# Patient Record
Sex: Female | Born: 1985
Health system: Southern US, Community
[De-identification: ages and names within clinical notes are randomized; demographics above are authoritative.]

## PROBLEM LIST (undated history)

## (undated) ENCOUNTER — Inpatient Hospital Stay (HOSPITAL_COMMUNITY): Payer: Self-pay

## (undated) DIAGNOSIS — D649 Anemia, unspecified: Secondary | ICD-10-CM

## (undated) DIAGNOSIS — F419 Anxiety disorder, unspecified: Secondary | ICD-10-CM

## (undated) DIAGNOSIS — M51369 Other intervertebral disc degeneration, lumbar region without mention of lumbar back pain or lower extremity pain: Secondary | ICD-10-CM

## (undated) DIAGNOSIS — R51 Headache: Secondary | ICD-10-CM

## (undated) DIAGNOSIS — Z9289 Personal history of other medical treatment: Secondary | ICD-10-CM

## (undated) DIAGNOSIS — R519 Headache, unspecified: Secondary | ICD-10-CM

## (undated) DIAGNOSIS — M5136 Other intervertebral disc degeneration, lumbar region: Secondary | ICD-10-CM

## (undated) DIAGNOSIS — M5126 Other intervertebral disc displacement, lumbar region: Secondary | ICD-10-CM

## (undated) DIAGNOSIS — R569 Unspecified convulsions: Secondary | ICD-10-CM

## (undated) HISTORY — DX: Unspecified convulsions: R56.9

## (undated) HISTORY — PX: TONSILLECTOMY: SUR1361

## (undated) HISTORY — PX: WISDOM TOOTH EXTRACTION: SHX21

---

## 2015-04-02 ENCOUNTER — Encounter: Payer: 59 | Attending: Obstetrics | Admitting: Skilled Nursing Facility1

## 2015-04-02 ENCOUNTER — Encounter: Payer: Self-pay | Admitting: Skilled Nursing Facility1

## 2015-04-02 VITALS — Ht 71.0 in | Wt 286.0 lb

## 2015-04-02 DIAGNOSIS — Z6839 Body mass index (BMI) 39.0-39.9, adult: Secondary | ICD-10-CM | POA: Insufficient documentation

## 2015-04-02 DIAGNOSIS — Z713 Dietary counseling and surveillance: Secondary | ICD-10-CM | POA: Insufficient documentation

## 2015-04-02 DIAGNOSIS — E669 Obesity, unspecified: Secondary | ICD-10-CM | POA: Diagnosis not present

## 2015-04-02 NOTE — Progress Notes (Signed)
  Medical Nutrition Therapy:  Appt start time: 8:15 end time:  9:15.   Assessment:  Primary concerns today: referred for obesity. Pt states her biggest problem is that she has a back injury 3 years ago and gained weight since that time. Pt states she is a vegetarian and avoids fatty foods. Pt states her usual weight was 201 pounds before her back injury. Pt states she has tried intermittent fasting-only eating 8 hours a day from 11am to 8pm for 7 days a week done for 1.5 months; she is also monitoring her added sugar intake attempting to keep it under 55 grams a day. Pt states she also worked out with a Physiological scientist and lost 15 pounds but then sprained her ankle and gained it back. Pt states she does not sleep well-she can fall asleep but cannot stay asleep and at most gets about 4 hours of sleep this has been happening for about 6 months at that time she took on a more stressful position. Pt states she eats at the table most of the time but sometimes she does eat in front of the television.   Preferred Learning Style:   Auditory  Visual Learning Readiness:   Ready  MEDICATIONS: See List   DIETARY INTAKE:  Usual eating pattern includes 2 meals and 3 snacks per day.  Everyday foods include starbucks coffee.  Avoided foods include vegeterian.    24-hr recall:  B ( AM): Starbucks every morning with milk Snk ( AM): almonds  L ( PM): salad with veggie chicken strips------which which or tai food Snk ( PM): almonds D ( PM): protein, carbohydrate, vegetables Snk ( PM): almonds Beverages: water  Usual physical activity: 4 times a week-weight lifting and three miles on the tredmill, garden  Estimated energy needs: 1800 calories 200 g carbohydrates 135 g protein 50 g fat  Progress Towards Goal(s):  In progress.   Nutritional Diagnosis:  Superior-3.3 Overweight/obesity As related to calorically dense beverage every day and large portions at meals.  As evidenced by pt report, 24 hour  recall, and BMI 39.89.    Intervention:  Nutrition counseling for obesity. Dietitian educated the pt on calrically dense beverages such as her starbucks coffee, "hidden" calories, balanced meals,and daily physical activity.  Teaching Method Utilized:  Visual Auditory  Barriers to learning/adherence to lifestyle change: the time it takes to make behavior change  Demonstrated degree of understanding via:  Teach Back   Monitoring/Evaluation:  Dietary intake, exercise, and body weight prn.

## 2015-04-02 NOTE — Patient Instructions (Signed)
-  Instead of using a scale use your measurements as a measure of change as well as energy level and how your clothes fit -Stress relief mechanisms: gardening, meditating/yoga, and hiking attempt these activities for 30 minutes every day -For better sleep: try a mask, ear plugs, or a noise machine - Try to eat three meals a day -Attempt a balanced meal for every meal and snack throughout the day -Read the nutrition facts label and compare your products

## 2015-05-29 ENCOUNTER — Other Ambulatory Visit (HOSPITAL_COMMUNITY): Payer: Self-pay | Admitting: Obstetrics

## 2015-05-29 DIAGNOSIS — N979 Female infertility, unspecified: Secondary | ICD-10-CM

## 2015-06-04 ENCOUNTER — Ambulatory Visit: Payer: Self-pay | Admitting: Skilled Nursing Facility1

## 2015-06-05 ENCOUNTER — Ambulatory Visit (HOSPITAL_COMMUNITY)
Admission: RE | Admit: 2015-06-05 | Discharge: 2015-06-05 | Disposition: A | Discharge status: Home / Self Care | Payer: 59 | Source: Ambulatory Visit | Attending: Obstetrics | Admitting: Obstetrics

## 2015-06-05 DIAGNOSIS — N979 Female infertility, unspecified: Secondary | ICD-10-CM | POA: Diagnosis present

## 2015-06-05 MED ORDER — IOHEXOL 300 MG/ML  SOLN
30.0000 mL | Freq: Once | INTRAMUSCULAR | Status: DC | PRN
  Administered 2015-06-05: 30 mL
  Filled 2015-06-05: qty 30

## 2015-08-21 LAB — OB RESULTS CONSOLE GC/CHLAMYDIA
Chlamydia: NEGATIVE
Gonorrhea: NEGATIVE

## 2015-09-19 LAB — OB RESULTS CONSOLE ANTIBODY SCREEN: Antibody Screen: NEGATIVE

## 2015-09-19 LAB — OB RESULTS CONSOLE RUBELLA ANTIBODY, IGM: RUBELLA: IMMUNE

## 2015-09-19 LAB — OB RESULTS CONSOLE HIV ANTIBODY (ROUTINE TESTING): HIV: NONREACTIVE

## 2015-09-19 LAB — OB RESULTS CONSOLE HEPATITIS B SURFACE ANTIGEN: Hepatitis B Surface Ag: NEGATIVE

## 2015-09-19 LAB — OB RESULTS CONSOLE ABO/RH: RH TYPE: POSITIVE

## 2015-09-19 LAB — OB RESULTS CONSOLE RPR: RPR: NONREACTIVE

## 2015-10-28 NOTE — L&D Delivery Note (Signed)
Delivery Note Patient pushed for 30 minutes after she was noted to be C/C/+2 and At 5:11 PM a viable and healthy female was delivered via Vaginal, Spontaneous Delivery (Presentation: ; Occiput Posterior), over a midline episiotomy.  APGAR: 9, 9; weight 8 lb 3.6 oz (3730 g).  Delayed cord clamping was performed. Cord blood was obtained for donation.  The placenta was then delivered spontaneously, intact, 3 vessels were noted.  There was persistent uterine atony and relaxation of the lower uterine segment despite massage and IV pitocin. A post partum hemorrhage was then announced and a code hemorrhage was called. Cytotec 1000 mcg was placed per rectum. A foley catheter was replaced and 2 large bore IVs were placed. Dr. Glo Herring assisted in massage and removal of intrauterine clots.  IM Hemabate was administered and the uterine tone improved and the bleeding slowed.  The patient remained awake, alert and oriented throughout the emergency and her vitals remained stable.   The Second degree episiotomy was then repaired in routine fashion using a 2-0 vicryl rapide and a 3-0 chromic suture.  The patient had a low Hb 9 on admission and the patient was counseled during and after the emergency. She was made Aware that she may require a blood transfusion depending on what the nadir of the Hb.  Will check a cbc 2 hrs post partum and one in am. Will transfer patient to AICU / Antepartum for close monitoring d/t her elevated BPs as well as the hemorrhage.    Anesthesia: Epidural  Episiotomy: Median Lacerations: 2nd degree Suture Repair: 2.0 vicryl rapide Est. Blood Loss (mL): [id number]  Mom to AICU.  Baby to Couplet care / Skin to Skin.  [name], Beth 04/02/2016, 8:11 PM

## 2016-03-03 LAB — OB RESULTS CONSOLE GBS: GBS: NEGATIVE

## 2016-04-01 ENCOUNTER — Inpatient Hospital Stay (HOSPITAL_COMMUNITY): Payer: BLUE CROSS/BLUE SHIELD

## 2016-04-01 ENCOUNTER — Encounter (HOSPITAL_COMMUNITY): Payer: Self-pay | Admitting: General Surgery

## 2016-04-01 ENCOUNTER — Inpatient Hospital Stay (HOSPITAL_COMMUNITY)
Admission: AD | Admit: 2016-04-01 | Discharge: 2016-04-01 | Disposition: A | Discharge status: Home / Self Care | Payer: BLUE CROSS/BLUE SHIELD | Source: Ambulatory Visit | Attending: Obstetrics and Gynecology | Admitting: Obstetrics and Gynecology

## 2016-04-01 DIAGNOSIS — O4290 Premature rupture of membranes, unspecified as to length of time between rupture and onset of labor, unspecified weeks of gestation: Secondary | ICD-10-CM

## 2016-04-01 DIAGNOSIS — Z3490 Encounter for supervision of normal pregnancy, unspecified, unspecified trimester: Secondary | ICD-10-CM

## 2016-04-01 HISTORY — DX: Other intervertebral disc degeneration, lumbar region without mention of lumbar back pain or lower extremity pain: M51.369

## 2016-04-01 HISTORY — DX: Other intervertebral disc displacement, lumbar region: M51.26

## 2016-04-01 HISTORY — DX: Other intervertebral disc degeneration, lumbar region: M51.36

## 2016-04-01 LAB — POCT FERN TEST: POCT Fern Test: NEGATIVE

## 2016-04-01 NOTE — Discharge Summary (Signed)
Pt D/C home in her husband and mother's care. VSS. Pt in no apparent distress. Reviewed with pt the signs and symptoms of active labor and also signs of SROM. Reviewed clinical criteria that indicates active labor, SROM, or need to be seen in MAU or by MD. Pt. Verbalized understanding.

## 2016-04-02 ENCOUNTER — Inpatient Hospital Stay (HOSPITAL_COMMUNITY)
Admission: AD | Admit: 2016-04-02 | Discharge: 2016-04-04 | DRG: 774 | Disposition: A | Discharge status: Home / Self Care | Payer: BLUE CROSS/BLUE SHIELD | Source: Ambulatory Visit | Attending: Obstetrics & Gynecology | Admitting: Obstetrics & Gynecology

## 2016-04-02 ENCOUNTER — Inpatient Hospital Stay (HOSPITAL_COMMUNITY): Payer: BLUE CROSS/BLUE SHIELD | Admitting: Anesthesiology

## 2016-04-02 ENCOUNTER — Encounter (HOSPITAL_COMMUNITY): Payer: Self-pay | Admitting: *Deleted

## 2016-04-02 DIAGNOSIS — Z3A40 40 weeks gestation of pregnancy: Secondary | ICD-10-CM

## 2016-04-02 DIAGNOSIS — Z6841 Body Mass Index (BMI) 40.0 and over, adult: Secondary | ICD-10-CM | POA: Diagnosis not present

## 2016-04-02 DIAGNOSIS — O99284 Endocrine, nutritional and metabolic diseases complicating childbirth: Secondary | ICD-10-CM | POA: Diagnosis present

## 2016-04-02 DIAGNOSIS — E039 Hypothyroidism, unspecified: Secondary | ICD-10-CM | POA: Diagnosis present

## 2016-04-02 DIAGNOSIS — O99214 Obesity complicating childbirth: Secondary | ICD-10-CM | POA: Diagnosis present

## 2016-04-02 DIAGNOSIS — D649 Anemia, unspecified: Secondary | ICD-10-CM | POA: Diagnosis not present

## 2016-04-02 DIAGNOSIS — O9081 Anemia of the puerperium: Secondary | ICD-10-CM | POA: Diagnosis not present

## 2016-04-02 DIAGNOSIS — K219 Gastro-esophageal reflux disease without esophagitis: Secondary | ICD-10-CM | POA: Diagnosis present

## 2016-04-02 DIAGNOSIS — Z8249 Family history of ischemic heart disease and other diseases of the circulatory system: Secondary | ICD-10-CM | POA: Diagnosis not present

## 2016-04-02 DIAGNOSIS — IMO0001 Reserved for inherently not codable concepts without codable children: Secondary | ICD-10-CM

## 2016-04-02 DIAGNOSIS — O9962 Diseases of the digestive system complicating childbirth: Secondary | ICD-10-CM | POA: Diagnosis present

## 2016-04-02 LAB — CBC
HCT: 30.3 % — ABNORMAL LOW (ref 36.0–46.0)
HCT: 26.1 % — ABNORMAL LOW (ref 36.0–46.0)
HCT: 24.4 % — ABNORMAL LOW (ref 36.0–46.0)
HEMATOCRIT: 22.9 % — AB (ref 36.0–46.0)
HEMOGLOBIN: 7.6 g/dL — AB (ref 12.0–15.0)
HEMOGLOBIN: 7.2 g/dL — AB (ref 12.0–15.0)
Hemoglobin: 9.6 g/dL — ABNORMAL LOW (ref 12.0–15.0)
Hemoglobin: 8.1 g/dL — ABNORMAL LOW (ref 12.0–15.0)
MCH: 22.1 pg — AB (ref 26.0–34.0)
MCH: 21.8 pg — ABNORMAL LOW (ref 26.0–34.0)
MCH: 22 pg — ABNORMAL LOW (ref 26.0–34.0)
MCH: 22.4 pg — ABNORMAL LOW (ref 26.0–34.0)
MCHC: 31.7 g/dL (ref 30.0–36.0)
MCHC: 31 g/dL (ref 30.0–36.0)
MCHC: 31.1 g/dL (ref 30.0–36.0)
MCHC: 31.4 g/dL (ref 30.0–36.0)
MCV: 69.8 fL — ABNORMAL LOW (ref 78.0–100.0)
MCV: 70.4 fL — ABNORMAL LOW (ref 78.0–100.0)
MCV: 70.7 fL — ABNORMAL LOW (ref 78.0–100.0)
MCV: 71.1 fL — ABNORMAL LOW (ref 78.0–100.0)
PLATELETS: 246 10*3/uL (ref 150–400)
PLATELETS: 201 10*3/uL (ref 150–400)
PLATELETS: 203 10*3/uL (ref 150–400)
Platelets: 201 10*3/uL (ref 150–400)
RBC: 4.34 MIL/uL (ref 3.87–5.11)
RBC: 3.71 MIL/uL — ABNORMAL LOW (ref 3.87–5.11)
RBC: 3.45 MIL/uL — AB (ref 3.87–5.11)
RBC: 3.22 MIL/uL — AB (ref 3.87–5.11)
RDW: 17.6 % — ABNORMAL HIGH (ref 11.5–15.5)
RDW: 17.7 % — AB (ref 11.5–15.5)
RDW: 17.8 % — ABNORMAL HIGH (ref 11.5–15.5)
RDW: 17.7 % — AB (ref 11.5–15.5)
WBC: 10 10*3/uL (ref 4.0–10.5)
WBC: 11.6 10*3/uL — ABNORMAL HIGH (ref 4.0–10.5)
WBC: 16.6 10*3/uL — AB (ref 4.0–10.5)
WBC: 16 10*3/uL — AB (ref 4.0–10.5)

## 2016-04-02 LAB — COMPREHENSIVE METABOLIC PANEL
ALK PHOS: 169 U/L — AB (ref 38–126)
ALT: 13 U/L — AB (ref 14–54)
ALT: 13 U/L — AB (ref 14–54)
AST: 24 U/L (ref 15–41)
AST: 21 U/L (ref 15–41)
Albumin: 3.1 g/dL — ABNORMAL LOW (ref 3.5–5.0)
Albumin: 2.3 g/dL — ABNORMAL LOW (ref 3.5–5.0)
Alkaline Phosphatase: 117 U/L (ref 38–126)
Anion gap: 8 (ref 5–15)
Anion gap: 5 (ref 5–15)
BUN: 7 mg/dL (ref 6–20)
CALCIUM: 9.5 mg/dL (ref 8.9–10.3)
CHLORIDE: 108 mmol/L (ref 101–111)
CO2: 19 mmol/L — AB (ref 22–32)
CO2: 22 mmol/L (ref 22–32)
CREATININE: 0.6 mg/dL (ref 0.44–1.00)
CREATININE: 0.52 mg/dL (ref 0.44–1.00)
Calcium: 8.8 mg/dL — ABNORMAL LOW (ref 8.9–10.3)
Chloride: 109 mmol/L (ref 101–111)
GFR calc Af Amer: >60 mL/min (ref >60)
GFR calc non Af Amer: >60 mL/min (ref >60)
GLUCOSE: 92 mg/dL (ref 65–99)
Glucose, Bld: 102 mg/dL — ABNORMAL HIGH (ref 65–99)
Potassium: 4.1 mmol/L (ref 3.5–5.1)
Potassium: 3.3 mmol/L — ABNORMAL LOW (ref 3.5–5.1)
SODIUM: 136 mmol/L (ref 135–145)
SODIUM: 135 mmol/L (ref 135–145)
Total Bilirubin: 0.8 mg/dL (ref 0.3–1.2)
Total Bilirubin: 1.1 mg/dL (ref 0.3–1.2)
Total Protein: 6.8 g/dL (ref 6.5–8.1)
Total Protein: 5.3 g/dL — ABNORMAL LOW (ref 6.5–8.1)

## 2016-04-02 LAB — FIBRINOGEN: FIBRINOGEN: 343 mg/dL (ref 204–475)

## 2016-04-02 LAB — DIC (DISSEMINATED INTRAVASCULAR COAGULATION)PANEL
INR: 1.13 (ref 0.00–1.49)
Platelets: 209 10*3/uL (ref 150–400)
Prothrombin Time: 14.6 seconds (ref 11.6–15.2)
aPTT: 29 seconds (ref 24–37)

## 2016-04-02 LAB — PROTIME-INR
INR: 1.16 (ref 0.00–1.49)
Prothrombin Time: 14.9 seconds (ref 11.6–15.2)

## 2016-04-02 LAB — APTT: aPTT: 29 seconds (ref 24–37)

## 2016-04-02 LAB — POSTPARTUM HEMORRHAGE PROTOCOL (BB NOTIFICATION)

## 2016-04-02 LAB — URIC ACID: Uric Acid, Serum: 4.8 mg/dL (ref 2.3–6.6)

## 2016-04-02 LAB — DIC (DISSEMINATED INTRAVASCULAR COAGULATION) PANEL
D DIMER QUANT: 1.83 ug{FEU}/mL — AB (ref 0.00–0.50)
FIBRINOGEN: 382 mg/dL (ref 204–475)
SMEAR REVIEW: NONE SEEN

## 2016-04-02 LAB — ABO/RH: ABO/RH(D): O POS

## 2016-04-02 LAB — RPR: RPR: NONREACTIVE

## 2016-04-02 LAB — PREPARE RBC (CROSSMATCH)

## 2016-04-02 MED ORDER — ACETAMINOPHEN 325 MG PO TABS
650.0000 mg | ORAL_TABLET | Freq: Once | ORAL | Status: AC
  Administered 2016-04-02: 650 mg via ORAL
  Filled 2016-04-02: qty 2

## 2016-04-02 MED ORDER — WITCH HAZEL-GLYCERIN EX PADS: 1.0000 "application " | MEDICATED_PAD | CUTANEOUS | Status: DC | PRN

## 2016-04-02 MED ORDER — ONDANSETRON HCL 4 MG/2ML IJ SOLN: 4.0000 mg | INTRAMUSCULAR | Status: DC | PRN

## 2016-04-02 MED ORDER — OXYTOCIN BOLUS FROM INFUSION
500.0000 mL | INTRAVENOUS | Status: DC
  Administered 2016-04-02: 500 mL via INTRAVENOUS

## 2016-04-02 MED ORDER — FENTANYL CITRATE (PF) 100 MCG/2ML IJ SOLN
INTRAMUSCULAR | Status: AC
  Administered 2016-04-02: 100 ug
  Filled 2016-04-02: qty 4

## 2016-04-02 MED ORDER — ZOLPIDEM TARTRATE 5 MG PO TABS: 5.0000 mg | ORAL_TABLET | Freq: Every evening | ORAL | Status: DC | PRN

## 2016-04-02 MED ORDER — ACETAMINOPHEN 325 MG PO TABS: 650.0000 mg | ORAL_TABLET | ORAL | Status: DC | PRN

## 2016-04-02 MED ORDER — FLEET ENEMA 7-19 GM/118ML RE ENEM: 1.0000 | ENEMA | RECTAL | Status: DC | PRN

## 2016-04-02 MED ORDER — OXYTOCIN 40 UNITS IN LACTATED RINGERS INFUSION - SIMPLE MED: 2.5000 [IU]/h | INTRAVENOUS | Status: DC

## 2016-04-02 MED ORDER — LACTATED RINGERS IV SOLN
500.0000 mL | Freq: Once | INTRAVENOUS | Status: AC
  Administered 2016-04-02: 500 mL via INTRAVENOUS

## 2016-04-02 MED ORDER — DIPHENHYDRAMINE HCL 50 MG/ML IJ SOLN
25.0000 mg | Freq: Once | INTRAMUSCULAR | Status: AC
  Administered 2016-04-02: 25 mg via INTRAVENOUS
  Filled 2016-04-02: qty 1

## 2016-04-02 MED ORDER — CARBOPROST TROMETHAMINE 250 MCG/ML IM SOLN
INTRAMUSCULAR | Status: AC
  Administered 2016-04-02: 250 ug
  Filled 2016-04-02: qty 1

## 2016-04-02 MED ORDER — ONDANSETRON HCL 4 MG PO TABS: 4.0000 mg | ORAL_TABLET | ORAL | Status: DC | PRN

## 2016-04-02 MED ORDER — DIPHENHYDRAMINE HCL 25 MG PO CAPS: 25.0000 mg | ORAL_CAPSULE | Freq: Four times a day (QID) | ORAL | Status: DC | PRN

## 2016-04-02 MED ORDER — OXYCODONE HCL 5 MG PO TABS: 5.0000 mg | ORAL_TABLET | ORAL | Status: DC | PRN

## 2016-04-02 MED ORDER — LACTATED RINGERS IV BOLUS (SEPSIS)
500.0000 mL | Freq: Once | INTRAVENOUS | Status: AC
  Administered 2016-04-02: 500 mL via INTRAVENOUS

## 2016-04-02 MED ORDER — OXYTOCIN 40 UNITS IN LACTATED RINGERS INFUSION - SIMPLE MED: 10.0000 [IU]/h | INTRAVENOUS | Status: DC

## 2016-04-02 MED ORDER — GENTAMICIN SULFATE 40 MG/ML IJ SOLN
Freq: Once | INTRAVENOUS | Status: AC
  Administered 2016-04-02: 19:00:00 via INTRAVENOUS
  Filled 2016-04-02: qty 5.5

## 2016-04-02 MED ORDER — EPHEDRINE 5 MG/ML INJ
10.0000 mg | INTRAVENOUS | Status: DC | PRN
  Filled 2016-04-02: qty 2

## 2016-04-02 MED ORDER — DIPHENOXYLATE-ATROPINE 2.5-0.025 MG PO TABS
2.0000 | ORAL_TABLET | Freq: Four times a day (QID) | ORAL | Status: DC | PRN
  Administered 2016-04-02: 2 via ORAL
  Filled 2016-04-02: qty 2

## 2016-04-02 MED ORDER — SODIUM CHLORIDE 0.9 % IV SOLN
Freq: Once | INTRAVENOUS | Status: AC
  Administered 2016-04-02: 23:00:00 via INTRAVENOUS

## 2016-04-02 MED ORDER — FENTANYL CITRATE (PF) 100 MCG/2ML IJ SOLN: 25.0000 ug | INTRAMUSCULAR | Status: DC | PRN

## 2016-04-02 MED ORDER — TERBUTALINE SULFATE 1 MG/ML IJ SOLN: 0.2500 mg | Freq: Once | INTRAMUSCULAR | Status: DC | PRN

## 2016-04-02 MED ORDER — CARBOPROST TROMETHAMINE 250 MCG/ML IM SOLN
250.0000 ug | INTRAMUSCULAR | Status: DC | PRN
Start: 2016-04-02 — End: 2016-04-04

## 2016-04-02 MED ORDER — IBUPROFEN 600 MG PO TABS
600.0000 mg | ORAL_TABLET | Freq: Four times a day (QID) | ORAL | Status: DC
  Administered 2016-04-02 – 2016-04-04 (×6): 600 mg via ORAL
  Filled 2016-04-02 (×6): qty 1

## 2016-04-02 MED ORDER — PHENYLEPHRINE 40 MCG/ML (10ML) SYRINGE FOR IV PUSH (FOR BLOOD PRESSURE SUPPORT)
80.0000 ug | PREFILLED_SYRINGE | INTRAVENOUS | Status: DC | PRN
  Filled 2016-04-02: qty 5
  Filled 2016-04-02: qty 10

## 2016-04-02 MED ORDER — PRENATAL MULTIVITAMIN CH
1.0000 | ORAL_TABLET | Freq: Every day | ORAL | Status: DC
  Administered 2016-04-03: 1 via ORAL
  Filled 2016-04-02: qty 1

## 2016-04-02 MED ORDER — METHYLERGONOVINE MALEATE 0.2 MG/ML IJ SOLN
INTRAMUSCULAR | Status: AC
Start: 2016-04-02 — End: 2016-04-03
  Filled 2016-04-02: qty 3

## 2016-04-02 MED ORDER — CLINDAMYCIN PHOSPHATE 900 MG/50ML IV SOLN: 900.0000 mg | Freq: Once | INTRAVENOUS | Status: DC

## 2016-04-02 MED ORDER — ONDANSETRON HCL 4 MG/2ML IJ SOLN: 4.0000 mg | Freq: Four times a day (QID) | INTRAMUSCULAR | Status: DC | PRN

## 2016-04-02 MED ORDER — OXYCODONE HCL 5 MG PO TABS
10.0000 mg | ORAL_TABLET | ORAL | Status: DC | PRN
Start: 2016-04-02 — End: 2016-04-04

## 2016-04-02 MED ORDER — LACTATED RINGERS IV SOLN
500.0000 mL | INTRAVENOUS | Status: DC | PRN
  Administered 2016-04-02: 1000 mL via INTRAVENOUS

## 2016-04-02 MED ORDER — BENZOCAINE-MENTHOL 20-0.5 % EX AERO: 1.0000 "application " | INHALATION_SPRAY | CUTANEOUS | Status: DC | PRN

## 2016-04-02 MED ORDER — OXYCODONE-ACETAMINOPHEN 5-325 MG PO TABS: 2.0000 | ORAL_TABLET | ORAL | Status: DC | PRN

## 2016-04-02 MED ORDER — OXYCODONE-ACETAMINOPHEN 5-325 MG PO TABS: 1.0000 | ORAL_TABLET | ORAL | Status: DC | PRN

## 2016-04-02 MED ORDER — DIBUCAINE 1 % RE OINT: 1.0000 "application " | TOPICAL_OINTMENT | RECTAL | Status: DC | PRN

## 2016-04-02 MED ORDER — LIDOCAINE HCL (PF) 1 % IJ SOLN
30.0000 mL | INTRAMUSCULAR | Status: DC | PRN
  Administered 2016-04-02: 30 mL via SUBCUTANEOUS
  Filled 2016-04-02: qty 30

## 2016-04-02 MED ORDER — SOD CITRATE-CITRIC ACID 500-334 MG/5ML PO SOLN
30.0000 mL | ORAL | Status: DC | PRN
  Administered 2016-04-02: 30 mL via ORAL
  Filled 2016-04-02: qty 15

## 2016-04-02 MED ORDER — FENTANYL 2.5 MCG/ML BUPIVACAINE 1/10 % EPIDURAL INFUSION (WH - ANES)
14.0000 mL/h | INTRAMUSCULAR | Status: DC | PRN
  Administered 2016-04-02: 16 mL/h via EPIDURAL
  Administered 2016-04-02 (×2): 14 mL/h via EPIDURAL
  Filled 2016-04-02 (×4): qty 125

## 2016-04-02 MED ORDER — SIMETHICONE 80 MG PO CHEW: 80.0000 mg | CHEWABLE_TABLET | ORAL | Status: DC | PRN

## 2016-04-02 MED ORDER — OXYTOCIN 40 UNITS IN LACTATED RINGERS INFUSION - SIMPLE MED
1.0000 m[IU]/min | INTRAVENOUS | Status: DC
  Administered 2016-04-02: 2 m[IU]/min via INTRAVENOUS
  Filled 2016-04-02: qty 1000

## 2016-04-02 MED ORDER — TETANUS-DIPHTH-ACELL PERTUSSIS 5-2.5-18.5 LF-MCG/0.5 IM SUSP
0.5000 mL | Freq: Once | INTRAMUSCULAR | Status: DC
  Filled 2016-04-02: qty 0.5

## 2016-04-02 MED ORDER — PHENYLEPHRINE 40 MCG/ML (10ML) SYRINGE FOR IV PUSH (FOR BLOOD PRESSURE SUPPORT)
80.0000 ug | PREFILLED_SYRINGE | INTRAVENOUS | Status: DC | PRN
  Filled 2016-04-02: qty 5

## 2016-04-02 MED ORDER — MISOPROSTOL 200 MCG PO TABS
1000.0000 ug | ORAL_TABLET | Freq: Once | ORAL | Status: AC
  Administered 2016-04-02: 1000 ug via RECTAL

## 2016-04-02 MED ORDER — DEXTROSE 5 % IV SOLN: 220.0000 mg | Freq: Once | INTRAVENOUS | Status: DC

## 2016-04-02 MED ORDER — MISOPROSTOL 200 MCG PO TABS
ORAL_TABLET | ORAL | Status: AC
  Filled 2016-04-02: qty 5

## 2016-04-02 MED ORDER — DIPHENHYDRAMINE HCL 50 MG/ML IJ SOLN
12.5000 mg | INTRAMUSCULAR | Status: DC | PRN
  Administered 2016-04-02: 12.5 mg via INTRAVENOUS
  Filled 2016-04-02: qty 1

## 2016-04-02 MED ORDER — COCONUT OIL OIL: 1.0000 "application " | TOPICAL_OIL | Status: DC | PRN

## 2016-04-02 MED ORDER — LACTATED RINGERS IV SOLN
INTRAVENOUS | Status: DC
  Administered 2016-04-02: 09:00:00 via INTRAVENOUS

## 2016-04-02 MED ORDER — LIDOCAINE HCL (PF) 1 % IJ SOLN
INTRAMUSCULAR | Status: DC | PRN
  Administered 2016-04-02 (×2): 5 mL via EPIDURAL

## 2016-04-02 MED ORDER — OXYTOCIN 40 UNITS IN LACTATED RINGERS INFUSION - SIMPLE MED
2.5000 [IU]/h | INTRAVENOUS | Status: DC | PRN
  Administered 2016-04-02: 2.5 [IU]/h via INTRAVENOUS
  Filled 2016-04-02: qty 1000

## 2016-04-02 MED ORDER — SENNOSIDES-DOCUSATE SODIUM 8.6-50 MG PO TABS
2.0000 | ORAL_TABLET | ORAL | Status: DC
  Administered 2016-04-02 – 2016-04-03 (×2): 2 via ORAL
  Filled 2016-04-02: qty 2

## 2016-04-02 NOTE — MAU Note (Signed)
Pt presents with contractions

## 2016-04-02 NOTE — Progress Notes (Signed)
Dr Alwyn Pea concerned with bleeding, urine catheter placed per verbal request. cytotec called to bedside to help with uterine atony.

## 2016-04-02 NOTE — Anesthesia Pain Management Evaluation Note (Signed)
  CRNA Pain Management Visit Note  Patient: Beth [name], 30 y.o., female  "Hello I am a member of the anesthesia team at [facility]. We have an anesthesia team available at all times to provide care throughout the hospital, including epidural management and anesthesia for C-section. I don't know your plan for the delivery whether it a natural birth, water birth, IV sedation, nitrous supplementation, doula or epidural, but we want to meet your pain goals."   1.Was your pain managed to your expectations on prior hospitalizations?      2.What is your expectation for pain management during this hospitalization?     Epidural  3.How can we help you reach that goal? Waiting for epidural placement  Record the patient's initial score and the patient's pain goal.   Pain: 8  Pain Goal: 4 [facility] wants you to be able to say your pain was always managed very well.  [facility] 04/02/2016

## 2016-04-02 NOTE — Progress Notes (Signed)
hemabate given per verbal request of dr pinn due to [facility]. Dr Newman Pies along with anesthesia at bedside to assist in [facility]. Pt alert and oriented. Vitals stable.

## 2016-04-02 NOTE — H&P (Signed)
Beth [name] is a 30 y.o. female presenting for painful regular contractions. She was evaluated in MAU yesterday for leaking of fluid, SROM ruled out.  She notes good fetal movement.   Maternal Medical History:  Reason for admission: Contractions.  Nausea.  Contractions: Onset was 6-12 hours ago.   Frequency: regular.   Duration is approximately 50 seconds.    Fetal activity: Perceived fetal activity is normal.   Last perceived fetal movement was within the past 12 hours.    Prenatal complications: Hypthyroidism  Prenatal Complications - Diabetes: none.    OB History    Gravida Para Term Preterm AB TAB SAB Ectopic Multiple Living   2 0             Past Medical History  Diagnosis Date  . Bulging lumbar disc    Past Surgical History  Procedure Laterality Date  . No past surgeries     Family History: family history includes Cancer in her other; Hypertension in her other. Social History:  reports that she has never smoked. She has never used smokeless tobacco. She reports that she does not drink alcohol or use illicit drugs.   Prenatal Transfer Tool  Maternal Diabetes: No Genetic Screening: Normal Maternal Ultrasounds/Referrals: Normal Fetal Ultrasounds or other Referrals:  Other: Normal anatomy scan Maternal Substance Abuse:  No Significant Maternal Medications:  Meds include: Syntroid Significant Maternal Lab Results:  Lab values include: Group B Strep negative Other Comments:  None  Review of Systems  Constitutional: Negative for fever and chills.  HENT: Negative for hearing loss.   Eyes: Negative for blurred vision and double vision.  Respiratory: Negative for cough and hemoptysis.   Cardiovascular: Negative for chest pain and palpitations.  Gastrointestinal: Negative for heartburn and nausea.  Genitourinary: Negative for dysuria and urgency.  Musculoskeletal: Negative for myalgias and neck pain.  Skin: Negative for itching and rash.  Neurological: Negative  for dizziness and headaches.  Endo/Heme/Allergies: Does not bruise/bleed easily.  Psychiatric/Behavioral: Negative for depression and suicidal ideas.  All other systems reviewed and are negative.   Dilation: 6 Effacement (%): 80, 90 Station: -1 Exam by:: h stone rnc Blood pressure 115/48, pulse 65, temperature 97.9 F (36.6 C), temperature source Oral, resp. rate 18, height 5\' 11"  (1.803 m), weight 150.594 kg (332 lb), last menstrual period 05/28/2015, SpO2 100 %. Maternal Exam:  Uterine Assessment: Contraction strength is moderate.  Contraction duration is 60 seconds. Contraction frequency is regular.   Abdomen: Patient reports no abdominal tenderness. Fundal height is 40 cm.   Estimated fetal weight is 3700 grams.   Fetal presentation: vertex  Introitus: Normal vulva. Normal vagina.  Ferning test: not done.  Nitrazine test: not done. Amniotic fluid character: not assessed.  Pelvis: adequate for delivery.   Cervix: Cervix evaluated by digital exam.   4-5/90/-1  Fetal Exam Fetal Monitor Review: Baseline rate: 135.  Variability: moderate (6-25 bpm).   Pattern: accelerations present and no decelerations.    Fetal State Assessment: Category I - tracings are normal.     Physical Exam  Nursing note and vitals reviewed. Constitutional: She is oriented to person, place, and time. She appears well-developed and well-nourished.  HENT:  Head: Normocephalic.  Eyes: Pupils are equal, round, and reactive to light.  Neck: Normal range of motion.  Cardiovascular: Normal rate, regular rhythm and normal heart sounds.   Respiratory: Effort normal.  GI: Soft.  Genitourinary: Vagina normal.  Musculoskeletal: Normal range of motion.  Neurological: She is alert and oriented  to person, place, and time. She has normal reflexes.  Skin: Skin is warm.  Psychiatric: She has a normal mood and affect.    Prenatal labs: ABO, Rh: --/--/O POS, O POS (06/07 0245) Antibody: NEG (06/07  0245) Rubella: Immune (11/23 0000) RPR: Nonreactive (11/23 0000)  HBsAg: Negative (11/23 0000)  HIV: Non-reactive (11/23 0000)  GBS:   NEG  Assessment/Plan: 30 yo G1P0 at 40 weeks 1day in active labor , elevated BPs  Admit to L&D Epidural on demand Continuous EFM & TOCO Pre-E labs drawn d/t elevated BPs were normal    Donterrius Santucci STACIA 04/02/2016, 8:43 AM

## 2016-04-02 NOTE — Anesthesia Preprocedure Evaluation (Signed)
Anesthesia Evaluation  Patient identified by MRN, date of birth, ID band Patient awake    Reviewed: Allergy & Precautions, NPO status , Patient's Chart, lab work & pertinent test results  Airway Mallampati: III  TM Distance: >3 FB Neck ROM: Full    Dental no notable dental hx. (+) Teeth Intact   Pulmonary neg pulmonary ROS,    Pulmonary exam normal breath sounds clear to auscultation       Cardiovascular negative cardio ROS Normal cardiovascular exam Rhythm:Regular Rate:Normal     Neuro/Psych negative neurological ROS  negative psych ROS   GI/Hepatic Neg liver ROS, GERD  Controlled and Medicated,  Endo/Other  Morbid obesity  Renal/GU negative Renal ROS  negative genitourinary   Musculoskeletal Bulging lumbar disc Low back pain   Abdominal (+) + obese,   Peds  Hematology  (+) anemia ,   Anesthesia Other Findings   Reproductive/Obstetrics (+) Pregnancy                             Anesthesia Physical Anesthesia Plan  ASA: III  Anesthesia Plan: Epidural   Post-op Pain Management:    Induction:   Airway Management Planned: Natural Airway  Additional Equipment:   Intra-op Plan:   Post-operative Plan:   Informed Consent: I have reviewed the patients History and Physical, chart, labs and discussed the procedure including the risks, benefits and alternatives for the proposed anesthesia with the patient or authorized representative who has indicated his/her understanding and acceptance.     Plan Discussed with: Anesthesiologist  Anesthesia Plan Comments:         Anesthesia Quick Evaluation

## 2016-04-02 NOTE — Anesthesia Procedure Notes (Signed)
Epidural Patient location during procedure: OB Start time: 04/02/2016 3:44 AM  Staffing Anesthesiologist: Josephine Igo Performed by: anesthesiologist   Preanesthetic Checklist Completed: patient identified, site marked, surgical consent, pre-op evaluation, timeout performed, IV checked, risks and benefits discussed and monitors and equipment checked  Epidural Patient position: sitting Prep: site prepped and draped and DuraPrep Patient monitoring: continuous pulse ox and blood pressure Approach: midline Location: L3-L4 Injection technique: LOR air  Needle:  Needle type: Tuohy  Needle gauge: 17 G Needle length: 9 cm and 9 Needle insertion depth: 8 cm Catheter type: closed end flexible Catheter size: 19 Gauge Catheter at skin depth: 13 cm Test dose: negative and Other  Assessment Events: blood not aspirated, injection not painful, no injection resistance, negative IV test and no paresthesia  Additional Notes Patient identified. Risks and benefits discussed including failed block, incomplete  Pain control, post dural puncture headache, nerve damage, paralysis, blood pressure Changes, nausea, vomiting, reactions to medications-both toxic and allergic and post Partum back pain. All questions were answered. Patient expressed understanding and wished to proceed. Sterile technique was used throughout procedure. Epidural site was Dressed with sterile barrier dressing. No paresthesias, signs of intravascular injection Or signs of intrathecal spread were encountered.  Patient was more comfortable after the epidural was dosed. Please see RN's note for documentation of vital signs and FHR which are stable.

## 2016-04-02 NOTE — Progress Notes (Signed)
I responded to Code Hemorrhage for this patient.  Informed by bedside RN and House coverage that Respiratory Care services are not required at this time.

## 2016-04-03 LAB — CBC
HEMATOCRIT: 24.4 % — AB (ref 36.0–46.0)
HEMOGLOBIN: 7.9 g/dL — AB (ref 12.0–15.0)
MCH: 23.8 pg — ABNORMAL LOW (ref 26.0–34.0)
MCHC: 32.4 g/dL (ref 30.0–36.0)
MCV: 73.5 fL — ABNORMAL LOW (ref 78.0–100.0)
Platelets: 187 10*3/uL (ref 150–400)
RBC: 3.32 MIL/uL — AB (ref 3.87–5.11)
RDW: 19.5 % — ABNORMAL HIGH (ref 11.5–15.5)
WBC: 10.3 10*3/uL (ref 4.0–10.5)

## 2016-04-03 LAB — TYPE AND SCREEN
ABO/RH(D): O POS
ANTIBODY SCREEN: NEGATIVE
UNIT DIVISION: 0
Unit division: 0

## 2016-04-03 MED ORDER — FAMOTIDINE 20 MG PO TABS
20.0000 mg | ORAL_TABLET | Freq: Two times a day (BID) | ORAL | Status: DC
  Administered 2016-04-03 – 2016-04-04 (×3): 20 mg via ORAL
  Filled 2016-04-03 (×3): qty 1

## 2016-04-03 NOTE — Progress Notes (Signed)
Patient is eating, ambulating, voiding.  Pain control is good.  No anemia sx right now but just woke up.  Filed Vitals:   04/03/16 0503 04/03/16 0505 04/03/16 0508 04/03/16 0605  BP:  128/66  137/88  Pulse: 90 87 90 73  Temp:      TempSrc:      Resp:  18  18  Height:      Weight:      SpO2: 97%  97%     Fundus firm Perineum without swelling.  Lab Results  Component Value Date   WBC 10.3 04/03/2016   HGB 7.9* 04/03/2016   HCT 24.4* 04/03/2016   MCV 73.5* 04/03/2016   PLT 187 04/03/2016    --/--/O POS, O POS (06/07 0245)/RI  [name]/P Post partum day 1 NSVD with pp hemorrhage and 2 units PRBCs.  H/H is stable after transfusion.  Anemia precautions.    Routine care for now.  Expect d/c tomorrow.   Parents desires circumsision.  All risks, benefits and alternatives discussed with the mother.  Beth [name]

## 2016-04-03 NOTE — Progress Notes (Signed)
CSW received consult for Hx of Depression.  CSW reviewed MOB's chart and does not note this diagnosis in any of her information.  CSW contact Pediatric Teaching Service MD to inquire of any concerns.  She states no concerns and no documented hx of Depression that she is aware of.  CSW screening out referral.

## 2016-04-03 NOTE — Progress Notes (Signed)
Dr Philis Pique in department, icf completed for infant circ

## 2016-04-03 NOTE — Lactation Note (Signed)
This note was copied from a baby's chart. Lactation Consultation Note:  Lactation Brochure given to mother . Basic Breastfeeding teaching done from Baby and Me Book.  This is mothers first child. Infant is 12 hours old and has had 4  feedings. Infant was circumcised and is still sleepy. Mother has been doing frequent STS. Several attempts to wake infant without success to latch. Assist mother with hand expressing in a spoon. Infant was given approx. 5 ml of ebm. Advised mother to continue to do frequent STS. Suggested that she page for [facility] or staff nurse to assist with next feeding. Mother taught positioning of football and cross-cradle hold. Mother taught hand expression. Mother has large amts of colostrum when hand express. Mother informed of available [place] services and community support.   Patient Name: Beth [name] [id number] Date: 04/03/2016 Reason for consult: Initial assessment   Maternal Data Has patient been taught Hand Expression?: Yes Does the patient have breastfeeding experience prior to this delivery?: No  Feeding Feeding Type: Breast Milk  LATCH Score/Interventions Latch: Too sleepy or reluctant, no latch achieved, no sucking elicited. Intervention(s): Skin to skin;Teach feeding cues;Waking techniques  Audible Swallowing: None Intervention(s): Skin to skin;Hand expression  Type of Nipple: Everted at rest and after stimulation  Comfort (Breast/Nipple): Soft / non-tender     Hold (Positioning): Assistance needed to correctly position infant at breast and maintain latch. Intervention(s): Breastfeeding basics reviewed;Support Pillows;Position options;Skin to skin  LATCH Score: 5  Lactation Tools Discussed/Used     Consult Status Consult Status: Follow-up Date: 04/03/16 Follow-up type: In-patient    Jess Barters [facility] 04/03/2016, 12:15 PM

## 2016-04-03 NOTE — Anesthesia Postprocedure Evaluation (Signed)
Anesthesia Post Note  Patient: [name]  Procedure(s) Performed: * No procedures listed *  Patient location during evaluation: Antenatal Anesthesia Type: Epidural Level of consciousness: awake and alert Pain management: pain level controlled Vital Signs Assessment: post-procedure vital signs reviewed and stable Respiratory status: spontaneous breathing, nonlabored ventilation and respiratory function stable Cardiovascular status: stable Postop Assessment: no headache, no backache and epidural receding Anesthetic complications: no     Last Vitals:  Filed Vitals:   04/03/16 0508 04/03/16 0605  BP:  137/88  Pulse: 90 73  Temp:    Resp:  18    Last Pain:  Filed Vitals:   04/03/16 0606  PainSc: 0-No pain   Pain Goal:                 Beth [name]

## 2016-04-04 MED ORDER — IBUPROFEN 600 MG PO TABS: 600.0000 mg | ORAL_TABLET | Freq: Four times a day (QID) | ORAL | Status: DC | PRN

## 2016-04-04 MED ORDER — DOCUSATE SODIUM 100 MG PO CAPS: 100.0000 mg | ORAL_CAPSULE | Freq: Two times a day (BID) | ORAL | Status: DC

## 2016-04-04 MED ORDER — OXYCODONE-ACETAMINOPHEN 5-325 MG PO TABS: 2.0000 | ORAL_TABLET | ORAL | Status: DC | PRN

## 2016-04-04 NOTE — Discharge Summary (Signed)
Obstetric Discharge Summary Reason for Admission: onset of labor Prenatal Procedures: ultrasound Intrapartum Procedures: spontaneous vaginal delivery Postpartum Procedures: transfusion 2 units Complications-Operative and Postpartum: 2nd degree perineal laceration HEMOGLOBIN  Date Value Ref Range Status  04/03/2016 7.9* 12.0 - 15.0 g/dL Final   HCT  Date Value Ref Range Status  04/03/2016 24.4* 36.0 - 46.0 % Final    Physical Exam:  General: alert, cooperative and appears stated age 30: appropriate Uterine Fundus: firm Incision: healing well DVT Evaluation: No evidence of DVT seen on physical exam.  Discharge Diagnoses: Term Pregnancy-delivered  Discharge Information: Date: 04/04/2016 Activity: pelvic rest Diet: routine Medications: Ibuprofen, Colace and Percocet Condition: improved Instructions: refer to practice specific booklet Discharge to: home Follow-up Information    Follow up with PINN, Andrez Grime, MD In 4 weeks.   Specialty:  Obstetrics and Gynecology   Why:  For a postpartum evaluation   Contact information:   [address] [place] [address] [phone]       Newborn Data: Live born female  Birth Weight: 8 lb 3.6 oz (3730 g) APGAR: 9, 9  Home with mother.  Beth Orrick H. 04/04/2016, 10:01 AM

## 2016-04-04 NOTE — Lactation Note (Signed)
This note was copied from a baby's chart. Lactation Consultation Note  Mom is currently breastfeeding baby.  Baby is in cross cradle hold but tummy up instead of on his side.  I assisted mom with repositioning and relatching for a deeper latch.  Demonstrated techniques for a deeper latch.  Baby opened wide and latched easy and wide.  Observed baby actively nurse.  Instructed parents on breast massage during feeding to increase milk flow.  Mom states baby cluster fed last night.  Reassured and reviewed milk coming to volume.  Discharge teaching done including engorgement treatment. Questions answered.  Lactation outpatient services and support reviewed and encouraged.  Patient Name: Beth [name] [id number] Date: 04/04/2016 Reason for consult: Follow-up assessment   Maternal Data    Feeding Feeding Type: Breast Fed Length of feed: 10 min  LATCH Score/Interventions Latch: Grasps breast easily, tongue down, lips flanged, rhythmical sucking. Intervention(s): Skin to skin;Teach feeding cues;Waking techniques Intervention(s): Adjust position;Assist with latch;Breast massage;Breast compression  Audible Swallowing: A few with stimulation Intervention(s): Skin to skin Intervention(s): Skin to skin  Type of Nipple: Everted at rest and after stimulation  Comfort (Breast/Nipple): Soft / non-tender     Hold (Positioning): Assistance needed to correctly position infant at breast and maintain latch. Intervention(s): Support Pillows;Position options;Breastfeeding basics reviewed;Skin to skin  LATCH Score: 8  Lactation Tools Discussed/Used     Consult Status Consult Status: Complete    Khiya Friese S 04/04/2016, 10:20 AM

## 2016-06-09 ENCOUNTER — Encounter (HOSPITAL_COMMUNITY): Payer: Self-pay | Admitting: *Deleted

## 2016-06-09 ENCOUNTER — Other Ambulatory Visit: Payer: Self-pay | Admitting: Obstetrics & Gynecology

## 2016-06-10 ENCOUNTER — Other Ambulatory Visit: Payer: Self-pay | Admitting: Obstetrics & Gynecology

## 2016-06-12 ENCOUNTER — Encounter (HOSPITAL_COMMUNITY): Payer: Self-pay | Admitting: Certified Registered Nurse Anesthetist

## 2016-06-12 ENCOUNTER — Encounter (HOSPITAL_COMMUNITY)
Admission: RE | Disposition: A | Discharge status: Home / Self Care | Payer: Self-pay | Source: Ambulatory Visit | Attending: Obstetrics & Gynecology

## 2016-06-12 ENCOUNTER — Ambulatory Visit (HOSPITAL_COMMUNITY)
Admission: RE | Admit: 2016-06-12 | Discharge: 2016-06-12 | Disposition: A | Discharge status: Home / Self Care | Payer: BLUE CROSS/BLUE SHIELD | Source: Ambulatory Visit | Attending: Obstetrics & Gynecology | Admitting: Obstetrics & Gynecology

## 2016-06-12 ENCOUNTER — Ambulatory Visit (HOSPITAL_COMMUNITY): Payer: BLUE CROSS/BLUE SHIELD | Admitting: Anesthesiology

## 2016-06-12 DIAGNOSIS — D649 Anemia, unspecified: Secondary | ICD-10-CM | POA: Diagnosis not present

## 2016-06-12 DIAGNOSIS — D261 Other benign neoplasm of corpus uteri: Secondary | ICD-10-CM | POA: Diagnosis not present

## 2016-06-12 DIAGNOSIS — F419 Anxiety disorder, unspecified: Secondary | ICD-10-CM | POA: Insufficient documentation

## 2016-06-12 DIAGNOSIS — Z8249 Family history of ischemic heart disease and other diseases of the circulatory system: Secondary | ICD-10-CM | POA: Diagnosis not present

## 2016-06-12 DIAGNOSIS — Z91018 Allergy to other foods: Secondary | ICD-10-CM | POA: Diagnosis not present

## 2016-06-12 DIAGNOSIS — Z809 Family history of malignant neoplasm, unspecified: Secondary | ICD-10-CM | POA: Insufficient documentation

## 2016-06-12 DIAGNOSIS — Z880 Allergy status to penicillin: Secondary | ICD-10-CM | POA: Insufficient documentation

## 2016-06-12 DIAGNOSIS — N939 Abnormal uterine and vaginal bleeding, unspecified: Secondary | ICD-10-CM | POA: Insufficient documentation

## 2016-06-12 HISTORY — DX: Anxiety disorder, unspecified: F41.9

## 2016-06-12 HISTORY — DX: Headache: R51

## 2016-06-12 HISTORY — DX: Anemia, unspecified: D64.9

## 2016-06-12 HISTORY — DX: Personal history of other medical treatment: Z92.89

## 2016-06-12 HISTORY — DX: Headache, unspecified: R51.9

## 2016-06-12 HISTORY — PX: DILATION AND CURETTAGE OF UTERUS: SHX78

## 2016-06-12 LAB — CBC
HEMATOCRIT: 34.3 % — AB (ref 36.0–46.0)
HEMOGLOBIN: 10.9 g/dL — AB (ref 12.0–15.0)
MCH: 23.2 pg — AB (ref 26.0–34.0)
MCHC: 31.8 g/dL (ref 30.0–36.0)
MCV: 73 fL — ABNORMAL LOW (ref 78.0–100.0)
Platelets: 314 10*3/uL (ref 150–400)
RBC: 4.7 MIL/uL (ref 3.87–5.11)
RDW: 15.8 % — ABNORMAL HIGH (ref 11.5–15.5)
WBC: 4.3 10*3/uL (ref 4.0–10.5)

## 2016-06-12 LAB — TYPE AND SCREEN
ABO/RH(D): O POS
ANTIBODY SCREEN: NEGATIVE

## 2016-06-12 LAB — PREGNANCY, URINE: Preg Test, Ur: NEGATIVE

## 2016-06-12 SURGERY — DILATION AND CURETTAGE
Anesthesia: General | Site: Vagina

## 2016-06-12 MED ORDER — PROPOFOL 10 MG/ML IV BOLUS
INTRAVENOUS | Status: AC
  Filled 2016-06-12: qty 20

## 2016-06-12 MED ORDER — LIDOCAINE HCL (CARDIAC) 20 MG/ML IV SOLN
INTRAVENOUS | Status: DC | PRN
  Administered 2016-06-12: 80 mg via INTRAVENOUS

## 2016-06-12 MED ORDER — MIDAZOLAM HCL 2 MG/2ML IJ SOLN
INTRAMUSCULAR | Status: DC | PRN
  Administered 2016-06-12: 2 mg via INTRAVENOUS

## 2016-06-12 MED ORDER — IBUPROFEN 600 MG PO TABS: 600.0000 mg | ORAL_TABLET | Freq: Four times a day (QID) | ORAL | 0 refills | Status: DC | PRN

## 2016-06-12 MED ORDER — PROMETHAZINE HCL 25 MG/ML IJ SOLN: 6.2500 mg | INTRAMUSCULAR | Status: DC | PRN

## 2016-06-12 MED ORDER — KETOROLAC TROMETHAMINE 30 MG/ML IJ SOLN: 30.0000 mg | Freq: Once | INTRAMUSCULAR | Status: DC | PRN

## 2016-06-12 MED ORDER — DEXAMETHASONE SODIUM PHOSPHATE 10 MG/ML IJ SOLN
INTRAMUSCULAR | Status: DC | PRN
  Administered 2016-06-12: 4 mg via INTRAVENOUS

## 2016-06-12 MED ORDER — FENTANYL CITRATE (PF) 100 MCG/2ML IJ SOLN
25.0000 ug | INTRAMUSCULAR | Status: DC | PRN
  Administered 2016-06-12: 25 ug via INTRAVENOUS

## 2016-06-12 MED ORDER — LACTATED RINGERS IV SOLN
INTRAVENOUS | Status: DC
  Administered 2016-06-12 (×2): via INTRAVENOUS

## 2016-06-12 MED ORDER — KETOROLAC TROMETHAMINE 30 MG/ML IJ SOLN
INTRAMUSCULAR | Status: AC
  Filled 2016-06-12: qty 1

## 2016-06-12 MED ORDER — DEXAMETHASONE SODIUM PHOSPHATE 4 MG/ML IJ SOLN
INTRAMUSCULAR | Status: AC
  Filled 2016-06-12: qty 1

## 2016-06-12 MED ORDER — SCOPOLAMINE 1 MG/3DAYS TD PT72
1.0000 | MEDICATED_PATCH | Freq: Once | TRANSDERMAL | Status: DC
  Administered 2016-06-12: 1.5 mg via TRANSDERMAL

## 2016-06-12 MED ORDER — HYDROCODONE-ACETAMINOPHEN 7.5-325 MG PO TABS: 1.0000 | ORAL_TABLET | Freq: Once | ORAL | Status: DC | PRN

## 2016-06-12 MED ORDER — FENTANYL CITRATE (PF) 100 MCG/2ML IJ SOLN
INTRAMUSCULAR | Status: DC | PRN
  Administered 2016-06-12 (×2): 50 ug via INTRAVENOUS

## 2016-06-12 MED ORDER — ONDANSETRON HCL 4 MG/2ML IJ SOLN
INTRAMUSCULAR | Status: DC | PRN
  Administered 2016-06-12: 4 mg via INTRAVENOUS

## 2016-06-12 MED ORDER — HYDROMORPHONE HCL 1 MG/ML IJ SOLN: 0.2500 mg | INTRAMUSCULAR | Status: DC | PRN

## 2016-06-12 MED ORDER — LIDOCAINE HCL 1 % IJ SOLN
INTRAMUSCULAR | Status: DC | PRN
  Administered 2016-06-12: 10 mL

## 2016-06-12 MED ORDER — PROPOFOL 10 MG/ML IV BOLUS
INTRAVENOUS | Status: DC | PRN
  Administered 2016-06-12: 250 mg via INTRAVENOUS

## 2016-06-12 MED ORDER — MIDAZOLAM HCL 2 MG/2ML IJ SOLN
INTRAMUSCULAR | Status: AC
  Filled 2016-06-12: qty 2

## 2016-06-12 MED ORDER — LIDOCAINE HCL (CARDIAC) 20 MG/ML IV SOLN
INTRAVENOUS | Status: AC
  Filled 2016-06-12: qty 5

## 2016-06-12 MED ORDER — ONDANSETRON HCL 4 MG/2ML IJ SOLN
INTRAMUSCULAR | Status: AC
  Filled 2016-06-12: qty 2

## 2016-06-12 MED ORDER — LACTATED RINGERS IV SOLN: INTRAVENOUS | Status: DC

## 2016-06-12 MED ORDER — FENTANYL CITRATE (PF) 100 MCG/2ML IJ SOLN
INTRAMUSCULAR | Status: AC
  Filled 2016-06-12: qty 2

## 2016-06-12 MED ORDER — KETOROLAC TROMETHAMINE 30 MG/ML IJ SOLN
INTRAMUSCULAR | Status: DC | PRN
  Administered 2016-06-12: 30 mg via INTRAVENOUS

## 2016-06-12 MED ORDER — SCOPOLAMINE 1 MG/3DAYS TD PT72
MEDICATED_PATCH | TRANSDERMAL | Status: AC
  Filled 2016-06-12: qty 1

## 2016-06-12 MED ORDER — MEPERIDINE HCL 25 MG/ML IJ SOLN: 6.2500 mg | INTRAMUSCULAR | Status: DC | PRN

## 2016-06-12 MED ORDER — LIDOCAINE HCL 1 % IJ SOLN
INTRAMUSCULAR | Status: AC
  Filled 2016-06-12: qty 20

## 2016-06-12 MED ORDER — OXYCODONE-ACETAMINOPHEN 5-325 MG PO TABS: 1.0000 | ORAL_TABLET | ORAL | 0 refills | Status: DC | PRN

## 2016-06-12 SURGICAL SUPPLY — 12 items
CATH ROBINSON RED A/P 16FR (CATHETERS) ×3 IMPLANT
CLOTH BEACON ORANGE TIMEOUT ST (SAFETY) ×3 IMPLANT
CONTAINER PREFILL 10% NBF 60ML (FORM) ×6 IMPLANT
GLOVE BIO SURGEON STRL SZ 6.5 (GLOVE) ×2 IMPLANT
GLOVE BIO SURGEONS STRL SZ 6.5 (GLOVE) ×1
GLOVE BIOGEL PI IND STRL 7.0 (GLOVE) ×2 IMPLANT
GLOVE BIOGEL PI INDICATOR 7.0 (GLOVE) ×4
GOWN STRL REUS W/TWL LRG LVL3 (GOWN DISPOSABLE) ×6 IMPLANT
PACK VAGINAL MINOR WOMEN LF (CUSTOM PROCEDURE TRAY) ×3 IMPLANT
PAD OB MATERNITY 4.3X12.25 (PERSONAL CARE ITEMS) ×3 IMPLANT
TOWEL OR 17X24 6PK STRL BLUE (TOWEL DISPOSABLE) ×6 IMPLANT
WATER STERILE IRR 1000ML POUR (IV SOLUTION) ×3 IMPLANT

## 2016-06-12 NOTE — Anesthesia Preprocedure Evaluation (Addendum)
Anesthesia Evaluation  Patient identified by MRN, date of birth, ID band Patient awake    Reviewed: Allergy & Precautions, NPO status , Patient's Chart, lab work & pertinent test results  Airway Mallampati: II  TM Distance: >3 FB Neck ROM: Full    Dental  (+) Dental Advisory Given   Pulmonary neg pulmonary ROS,    breath sounds clear to auscultation       Cardiovascular negative cardio ROS   Rhythm:Regular Rate:Normal     Neuro/Psych  Headaches, PSYCHIATRIC DISORDERS Anxiety    GI/Hepatic negative GI ROS, Neg liver ROS,   Endo/Other  negative endocrine ROS  Renal/GU negative Renal ROS  negative genitourinary   Musculoskeletal negative musculoskeletal ROS (+)   Abdominal   Peds negative pediatric ROS (+)  Hematology negative hematology ROS (+)   Anesthesia Other Findings   Reproductive/Obstetrics negative OB ROS                            Lab Results  Component Value Date   WBC 10.3 04/03/2016   HGB 7.9 (L) 04/03/2016   HCT 24.4 (L) 04/03/2016   MCV 73.5 (L) 04/03/2016   PLT 187 04/03/2016   Lab Results  Component Value Date   CREATININE 0.52 04/02/2016   BUN <5 (L) 04/02/2016   NA 135 04/02/2016   K 3.3 (L) 04/02/2016   CL 108 04/02/2016   CO2 22 04/02/2016    Anesthesia Physical Anesthesia Plan  ASA: II  Anesthesia Plan: General   Post-op Pain Management:    Induction: Intravenous  Airway Management Planned: LMA  Additional Equipment:   Intra-op Plan:   Post-operative Plan: Extubation in OR  Informed Consent: I have reviewed the patients History and Physical, chart, labs and discussed the procedure including the risks, benefits and alternatives for the proposed anesthesia with the patient or authorized representative who has indicated his/her understanding and acceptance.   Dental advisory given  Plan Discussed with: CRNA  Anesthesia Plan Comments:         Anesthesia Quick Evaluation

## 2016-06-12 NOTE — Anesthesia Procedure Notes (Signed)
Procedure Name: LMA Insertion Date/Time: 06/12/2016 12:36 PM Performed by: Raenette Rover Pre-anesthesia Checklist: Patient identified, Patient being monitored, Emergency Drugs available and Suction available Patient Re-evaluated:Patient Re-evaluated prior to inductionOxygen Delivery Method: Circle system utilized Preoxygenation: Pre-oxygenation with 100% oxygen Intubation Type: IV induction LMA: LMA inserted LMA Size: 4.0 Number of attempts: 1 Placement Confirmation: positive ETCO2,  CO2 detector and breath sounds checked- equal and bilateral Tube secured with: Tape Dental Injury: Teeth and Oropharynx as per pre-operative assessment

## 2016-06-12 NOTE — Transfer of Care (Signed)
Immediate Anesthesia Transfer of Care Note  Patient: [name]  Procedure(s) Performed: Procedure(s): DILATATION AND CURETTAGE (N/A)  Patient Location: PACU  Anesthesia Type:General  Level of Consciousness: awake, alert , oriented and patient cooperative  Airway & Oxygen Therapy: Patient Spontanous Breathing and Patient connected to nasal cannula oxygen  Post-op Assessment: Report given to RN and Post -op Vital signs reviewed and stable  Post vital signs: Reviewed and stable  Last Vitals:  Vitals:   06/12/16 1133  BP: 123/84  Pulse: 64  Resp: 18  Temp: 36.9 C    Last Pain:  Vitals:   06/12/16 1133  TempSrc: Oral      Patients Stated Pain Goal: 5 ([phone] [phone])  Complications: No apparent anesthesia complications

## 2016-06-12 NOTE — Discharge Instructions (Signed)
DISCHARGE INSTRUCTIONS: D&C / D&E The following instructions have been prepared to help you care for yourself upon your return home.   Personal hygiene:  Use sanitary pads for vaginal drainage, not tampons.  Shower the day after your procedure.  NO tub baths, pools or Jacuzzis for 2-3 weeks.  Wipe front to back after using the bathroom.  Activity and limitations:  Do NOT drive or operate any equipment for 24 hours. The effects of anesthesia are still present and drowsiness may result.  Do NOT rest in bed all day.  Walking is encouraged.  Walk up and down stairs slowly.  You may resume your normal activity in one to two days or as indicated by your physician.  Sexual activity: NO intercourse for at least 2 weeks after the procedure, or as indicated by your physician.  Diet: Eat a light meal as desired this evening. You may resume your usual diet tomorrow.  Return to work: You may resume your work activities in one to two days or as indicated by your doctor.  What to expect after your surgery: Expect to have vaginal bleeding/discharge for 2-3 days and spotting for up to 10 days. It is not unusual to have soreness for up to 1-2 weeks. You may have a slight burning sensation when you urinate for the first day. Mild cramps may continue for a couple of days. You may have a regular period in 2-6 weeks.  NO IBUPROFEN PRODUCTS (MOTRIN, ADVIL) OR ALEVE UNTIL 7:00PM TODAY.  Call your doctor for any of the following:  Excessive vaginal bleeding, saturating and changing one pad every hour.  Inability to urinate 6 hours after discharge from hospital.  Pain not relieved by pain medication.  Fever of 100.4 F or greater.  Unusual vaginal discharge or odor.    Post Anesthesia Home Care Instructions  Activity: Get plenty of rest for the remainder of the day. A responsible adult should stay with you for 24 hours following the procedure.  For the next 24 hours, DO NOT: -Drive a  car -Paediatric nurse -Drink alcoholic beverages -Take any medication unless instructed by your physician -Make any legal decisions or sign important papers.  Meals: Start with liquid foods such as gelatin or soup. Progress to regular foods as tolerated. Avoid greasy, spicy, heavy foods. If nausea and/or vomiting occur, drink only clear liquids until the nausea and/or vomiting subsides. Call your physician if vomiting continues.  Special Instructions/Symptoms: Your throat may feel dry or sore from the anesthesia or the breathing tube placed in your throat during surgery. If this causes discomfort, gargle with warm salt water. The discomfort should disappear within 24 hours.  If you had a scopolamine patch placed behind your ear for the management of post- operative nausea and/or vomiting:  1. The medication in the patch is effective for 72 hours, after which it should be removed.  Wrap patch in a tissue and discard in the trash. Wash hands thoroughly with soap and water. 2. You may remove the patch earlier than 72 hours if you experience unpleasant side effects which may include dry mouth, dizziness or visual disturbances. 3. Avoid touching the patch. Wash your hands with soap and water after contact with the patch.      Call for an appointment:    Patients signature: ______________________  Nurses signature ________________________  Support person's signature_______________________

## 2016-06-12 NOTE — H&P (Signed)
Beth [name] is an 30 y.o. female with h/o persistent post  partum bleeding her delivery was June 0000000 complicated by post partum hemorrhage. Recent office US showed possible endometrial polyp versus retained placental tissue.    Pertinent Gynecological History: Menses: flow is excessive with passage of clots Bleeding: dysfunctional uterine bleeding Contraception: condoms DES exposure: denies Blood transfusions: none Sexually transmitted diseases: no past history Previous GYN Procedures: none  Last mammogram: n/a  Last pap: normal Date: 2017 OB History: G1, P1   Menstrual History: Menarche age: 59 No LMP recorded.    Past Medical History:  Diagnosis Date  . Anemia   . Anxiety   . Bulging lumbar disc   . Headache    otc med prn - last one 4 months ago  . History of blood transfusion    after 04/02/2016 svd at [facility]  . SVD (spontaneous vaginal delivery)    x 1    Past Surgical History:  Procedure Laterality Date  . WISDOM TOOTH EXTRACTION      Family History  Problem Relation Age of Onset  . Hypertension Other   . Cancer Other     Social History:  reports that she has never smoked. She has never used smokeless tobacco. She reports that she drinks alcohol. She reports that she does not use drugs.  Allergies:  Allergies  Allergen Reactions  . Kiwi Extract Anaphylaxis  . Penicillins Other (See Comments)    Never ending infection cycle Has patient had a PCN reaction causing immediate rash, facial/tongue/throat swelling, SOB or lightheadedness with hypotension: no Has patient had a PCN reaction causing severe rash involving mucus membranes or skin necrosis: no Has patient had a PCN reaction that required hospitalization no Has patient had a PCN reaction occurring within the last 10 years: yes If all of the above answers are "NO", then may proceed with Cephalosporin use.   Grayling Congress (Diagnostic) Itching    Prescriptions Prior to Admission  Medication Sig Dispense  Refill Last Dose  . aspirin-acetaminophen-caffeine (EXCEDRIN MIGRAINE) 250-250-65 MG tablet Take 2 tablets by mouth every 6 (six) hours as needed for headache.     . sertraline (ZOLOFT) 50 MG tablet Take 50 mg by mouth daily.     Marland Kitchen docusate sodium (COLACE) 100 MG capsule Take 1 capsule (100 mg total) by mouth 2 (two) times daily. (Patient taking differently: Take 100 mg by mouth daily as needed for mild constipation. ) 60 capsule 0   . ibuprofen (ADVIL,MOTRIN) 600 MG tablet Take 1 tablet (600 mg total) by mouth every 6 (six) hours as needed. 90 tablet 0   . oxyCODONE-acetaminophen (ROXICET) 5-325 MG tablet Take 2 tablets by mouth every 4 (four) hours as needed. May take 1-2 tablets every 4-6 hours as needed for pain (Patient not taking: Reported on 06/06/2016) 20 tablet 0 Not Taking at Unknown time  . Prenatal Vit-Fe Fumarate-FA (PRENATAL MULTIVITAMIN) TABS tablet Take 1 tablet by mouth daily at 12 noon.    Past Week at Unknown time    ROS Negative  Height 5\' 11"  (1.803 m), weight 127 kg (280 lb), currently breastfeeding. Physical Exam  Vitals reviewed. Genitourinary:  Genitourinary Comments: Exam in office: old vaginal blood in vault, no active bleeding from cervix; uterus small, non tender no palpable masses. Adnexa: non tender no palpable masses    No results found for this or any previous visit (from the past 24 hour(s)).  No results found.  Assessment/Plan: 30 yo with abnormal uterine bleeding, possible  endometrial polyp vs placental nodule  Patient on call to OR for D&C. She was counseled previously in office regarding risks of procedure to include but not limited to bleeding, infection, uterine perforation, endometrial scarring (eg Asherman's).  She voiced understanding and agrees to proceed.   Sae Handrich, [place] 06/12/2016, 11:24 AM

## 2016-06-12 NOTE — Anesthesia Postprocedure Evaluation (Signed)
Anesthesia Post Note  Patient: [name]  Procedure(s) Performed: Procedure(s) (LRB): DILATATION AND CURETTAGE (N/A)  Patient location during evaluation: PACU Anesthesia Type: General Level of consciousness: awake and alert Pain management: pain level controlled Vital Signs Assessment: post-procedure vital signs reviewed and stable Respiratory status: spontaneous breathing, nonlabored ventilation, respiratory function stable and patient connected to nasal cannula oxygen Cardiovascular status: blood pressure returned to baseline and stable Postop Assessment: no signs of nausea or vomiting Anesthetic complications: no     Last Vitals:  Vitals:   06/12/16 1500 06/12/16 1548  BP: 114/80 129/88  Pulse: (!) 47 62  Resp: 16 16  Temp: 36.5 C     Last Pain:  Vitals:   06/12/16 1548  TempSrc:   PainSc: 2    Pain Goal: Patients Stated Pain Goal: 5 (06/12/16 1133)               Tiajuana Amass

## 2016-06-12 NOTE — Op Note (Signed)
OP NOTE  Surgeon: Caffie Damme   Assistants: none  Pre-operative Diagnosis:Abnormal uterine bleeding, possible endometrial polyp  Post-operative Diagnosis: same  Surgeon: Surgeon(s) and Role:    * Sanjuana Kava, MD - Primary   Procedure Procedure(s) and Anesthesia Type:    * DILATATION AND CURETTAGE - General  Anesthesia: General LMA anesthesia  ASA Class: 2  EBL: 30 mL  UOP: 25 mL  IVF: 1100 mL  Specimen: Endometrial curettings  Findings: Exam under anesthesia: external vulva normal; vaginal vault: normal; cervix: grossly normal. Uterus anteverted, mobile, no palpable masses Adnexa: no palpable masses. Uterus sounded to 8.5cm Moderate amounts of polypoid like tissue evacuated  Complications: None  After adequate anesthesia was achieved, the patient was prepped and draped in the usual sterile fashion.  The speculum was placed in the vagina and the cervix stabilized with a single-tooth tenaculum.  10 mL of 1% plain lidocaine was used for a paracervical block. The  cervix was sounded to 8.5 cm and dilated with Hanks dilators to 16 Fr. Sharp curettage was then performed and uterine curettings sent to path.  Tenaculum sites were made hemostatic with silver nitrate. All instruments were removed from the vagina.  The patient tolerated the procedure well.  Sponge and instrument counts were correct x 2   Beth [name] Beth [name]

## 2016-06-13 ENCOUNTER — Encounter (HOSPITAL_COMMUNITY): Payer: Self-pay | Admitting: Obstetrics & Gynecology

## 2016-10-28 ENCOUNTER — Other Ambulatory Visit: Payer: Self-pay | Admitting: Obstetrics & Gynecology

## 2016-11-25 LAB — OB RESULTS CONSOLE RPR: RPR: NONREACTIVE

## 2016-11-25 LAB — OB RESULTS CONSOLE ABO/RH: RH Type: POSITIVE

## 2016-11-25 LAB — OB RESULTS CONSOLE GC/CHLAMYDIA
Chlamydia: NEGATIVE
Gonorrhea: NEGATIVE

## 2016-11-25 LAB — OB RESULTS CONSOLE ANTIBODY SCREEN: Antibody Screen: NEGATIVE

## 2016-11-25 LAB — OB RESULTS CONSOLE HIV ANTIBODY (ROUTINE TESTING): HIV: NONREACTIVE

## 2016-11-25 LAB — OB RESULTS CONSOLE HEPATITIS B SURFACE ANTIGEN: HEP B S AG: NEGATIVE

## 2016-11-25 LAB — OB RESULTS CONSOLE RUBELLA ANTIBODY, IGM: Rubella: IMMUNE

## 2017-01-25 ENCOUNTER — Encounter (HOSPITAL_BASED_OUTPATIENT_CLINIC_OR_DEPARTMENT_OTHER): Payer: Self-pay | Admitting: Emergency Medicine

## 2017-01-25 ENCOUNTER — Emergency Department (HOSPITAL_BASED_OUTPATIENT_CLINIC_OR_DEPARTMENT_OTHER)
Admission: EM | Admit: 2017-01-25 | Discharge: 2017-01-26 | Disposition: A | Discharge status: Home / Self Care | Payer: Managed Care, Other (non HMO) | Attending: Emergency Medicine | Admitting: Emergency Medicine

## 2017-01-25 DIAGNOSIS — J209 Acute bronchitis, unspecified: Secondary | ICD-10-CM | POA: Diagnosis not present

## 2017-01-25 DIAGNOSIS — Z79899 Other long term (current) drug therapy: Secondary | ICD-10-CM | POA: Insufficient documentation

## 2017-01-25 DIAGNOSIS — R51 Headache: Secondary | ICD-10-CM

## 2017-01-25 DIAGNOSIS — O99512 Diseases of the respiratory system complicating pregnancy, second trimester: Secondary | ICD-10-CM | POA: Diagnosis not present

## 2017-01-25 DIAGNOSIS — Z3A20 20 weeks gestation of pregnancy: Secondary | ICD-10-CM | POA: Diagnosis not present

## 2017-01-25 DIAGNOSIS — Z7982 Long term (current) use of aspirin: Secondary | ICD-10-CM | POA: Diagnosis not present

## 2017-01-25 DIAGNOSIS — O26892 Other specified pregnancy related conditions, second trimester: Secondary | ICD-10-CM | POA: Diagnosis present

## 2017-01-25 DIAGNOSIS — R519 Headache, unspecified: Secondary | ICD-10-CM

## 2017-01-25 MED ORDER — SODIUM CHLORIDE 0.9 % IV BOLUS (SEPSIS)
1000.0000 mL | Freq: Once | INTRAVENOUS | Status: AC
  Administered 2017-01-25: 1000 mL via INTRAVENOUS

## 2017-01-25 MED ORDER — METOCLOPRAMIDE HCL 5 MG/ML IJ SOLN
10.0000 mg | Freq: Once | INTRAMUSCULAR | Status: AC
  Administered 2017-01-25: 10 mg via INTRAVENOUS
  Filled 2017-01-25: qty 2

## 2017-01-25 MED ORDER — DIPHENHYDRAMINE HCL 50 MG/ML IJ SOLN
25.0000 mg | Freq: Once | INTRAMUSCULAR | Status: AC
  Administered 2017-01-25: 25 mg via INTRAVENOUS
  Filled 2017-01-25: qty 1

## 2017-01-25 NOTE — ED Notes (Signed)
FHT 140 RLQ

## 2017-01-25 NOTE — ED Notes (Signed)
ED Provider at bedside. 

## 2017-01-25 NOTE — ED Triage Notes (Signed)
PT presents to ED with c/o migraine, and cough

## 2017-01-25 NOTE — ED Provider Notes (Signed)
[facility] Provider Note: Georgena Spurling, MD, FACEP  CSN: [id number] MRN: [id number] ARRIVAL: 01/25/17 at 2213 ROOM: MH05/MH05  By signing my name below, I, Soijett Blue, attest that this documentation has been prepared under the direction and in the presence of Shanon Rosser, MD. Electronically Signed: Soijett Blue, ED Scribe. 01/25/17. 11:21 PM.  CHIEF COMPLAINT  Migraine   HISTORY OF PRESENT ILLNESS  Beth [name] is a 31 y.o. female with a PMHx of HA, who presents to the Emergency Department complaining of 6-8/10, intermittent, throbbing, right temporal migraine onset 3 weeks ago. Pt notes that she is 5 months pregnant and her migraines have increased with this pregnancy. She notes that she was taking Maxalt for her migraines prior to her pregnancy but has been switched to Fioricet which is not working.   Pt reports lingering cough after she was diagnosed with, and treated for, pneumonia 2 weeks ago. She was given an albuterol inhaler 2 days ago which is partly improved cough. She does not have an AeroChamber for it. She denies dysuria and fever.   Past Medical History:  Diagnosis Date  . Anemia   . Anxiety   . Bulging lumbar disc   . Headache    otc med prn - last one 4 months ago  . History of blood transfusion    after 04/02/2016 svd at [facility]  . SVD (spontaneous vaginal delivery)    x 1    Past Surgical History:  Procedure Laterality Date  . DILATION AND CURETTAGE OF UTERUS N/A 06/12/2016   Procedure: DILATATION AND CURETTAGE;  Surgeon: Sanjuana Kava, MD;  Location: [facility];  Service: Gynecology;  Laterality: N/A;  . WISDOM TOOTH EXTRACTION      Family History  Problem Relation Age of Onset  . Hypertension Other   . Cancer Other     Social History  Substance Use Topics  . Smoking status: Never Smoker  . Smokeless tobacco: Never Used  . Alcohol use Yes     Comment: occasional wine    Prior to Admission medications   Medication Sig Start Date End Date  Taking? Authorizing Provider  aspirin-acetaminophen-caffeine (EXCEDRIN MIGRAINE) [phone] MG tablet Take 2 tablets by mouth every 6 (six) hours as needed for headache.    Historical Provider, MD  docusate sodium (COLACE) 100 MG capsule Take 1 capsule (100 mg total) by mouth 2 (two) times daily. Patient taking differently: Take 100 mg by mouth daily as needed for mild constipation.  04/04/16   Vanessa Kick, MD  ibuprofen (ADVIL,MOTRIN) 600 MG tablet Take 1 tablet (600 mg total) by mouth every 6 (six) hours as needed. 06/12/16   Sanjuana Kava, MD  oxyCODONE-acetaminophen (ROXICET) 5-325 MG tablet Take 1-2 tablets by mouth every 4 (four) hours as needed. May take 1-2 tablets every 4-6 hours as needed for pain 06/12/16   Sanjuana Kava, MD  Prenatal Vit-Fe Fumarate-FA (PRENATAL MULTIVITAMIN) TABS tablet Take 1 tablet by mouth daily at 12 noon.     Historical Provider, MD  sertraline (ZOLOFT) 50 MG tablet Take 50 mg by mouth daily.    Historical Provider, MD    Allergies Kiwi extract; Penicillins; and Strawberry (diagnostic)   REVIEW OF SYSTEMS  Negative except as noted here or in the History of Present Illness.   PHYSICAL EXAMINATION  Initial Vital Signs Blood pressure 128/80, pulse (!) 59, temperature 98.4 F (36.9 C), temperature source Oral, resp. rate 16, height 5\' 11"  (1.803 m), weight 299 lb (135.6 kg), SpO2 100 %,  currently breastfeeding.  Examination General: Well-developed, well-nourished female in no acute distress; appearance consistent with age of record HENT: normocephalic; atraumatic Eyes: pupils equal, round and reactive to light; extraocular muscles intact Neck: supple Heart: regular rate and rhythm Lungs: clear to auscultation bilaterally. Coughing on deep breaths. Abdomen: soft; gravid, consistent with dates; fetal heart tones 140; nontender; no masses or hepatosplenomegaly; bowel sounds present.  Extremities: No deformity; full range of motion; pulses normal Neurologic: Awake,  alert and oriented; motor function intact in all extremities and symmetric; no facial droop; normal coordination and speech Skin: Warm and dry Psychiatric: Normal mood and affect   RESULTS  Summary of this visit's results, reviewed by myself:   EKG Interpretation  Date/Time:    Ventricular Rate:    PR Interval:    QRS Duration:   QT Interval:    QTC Calculation:   R Axis:     Text Interpretation:        Laboratory Studies: No results found for this or any previous visit (from the past 24 hour(s)). Imaging Studies: No results found.  ED COURSE  Nursing notes and initial vitals signs, including pulse oximetry, reviewed.  Vitals:   01/25/17 2225 01/25/17 2226 01/26/17 0034  BP:  128/80 123/64  Pulse:  (!) 59 63  Resp:  16 20  Temp:  98.4 F (36.9 C)   TempSrc:  Oral   SpO2:  100% 100%  Weight: 299 lb (135.6 kg)    Height: 5\' 11"  (1.803 m)     12:48 AM Headache improved after IV fluid bolus and medications. Patient given an AeroChamber for use with her inhaler.  PROCEDURES    ED DIAGNOSES     ICD-9-CM ICD-10-CM   1. Persistent headaches 784.0 R51   2. Acute bronchitis with bronchospasm 466.0 J20.9     I personally performed the services described in this documentation, which was scribed in my presence. The recorded information has been reviewed and is accurate.     Shanon Rosser, MD 01/26/17 [phone]

## 2017-03-15 ENCOUNTER — Inpatient Hospital Stay (HOSPITAL_COMMUNITY)
Admission: AD | Admit: 2017-03-15 | Discharge: 2017-03-16 | Disposition: A | Discharge status: Home / Self Care | Payer: Managed Care, Other (non HMO) | Source: Ambulatory Visit | Attending: Obstetrics & Gynecology | Admitting: Obstetrics & Gynecology

## 2017-03-15 ENCOUNTER — Encounter (HOSPITAL_COMMUNITY): Payer: Self-pay

## 2017-03-15 DIAGNOSIS — R197 Diarrhea, unspecified: Secondary | ICD-10-CM | POA: Insufficient documentation

## 2017-03-15 DIAGNOSIS — R109 Unspecified abdominal pain: Secondary | ICD-10-CM | POA: Diagnosis present

## 2017-03-15 DIAGNOSIS — O99343 Other mental disorders complicating pregnancy, third trimester: Secondary | ICD-10-CM | POA: Insufficient documentation

## 2017-03-15 DIAGNOSIS — O9989 Other specified diseases and conditions complicating pregnancy, childbirth and the puerperium: Secondary | ICD-10-CM | POA: Diagnosis present

## 2017-03-15 DIAGNOSIS — O99013 Anemia complicating pregnancy, third trimester: Secondary | ICD-10-CM | POA: Insufficient documentation

## 2017-03-15 DIAGNOSIS — Z880 Allergy status to penicillin: Secondary | ICD-10-CM | POA: Diagnosis not present

## 2017-03-15 DIAGNOSIS — O26893 Other specified pregnancy related conditions, third trimester: Secondary | ICD-10-CM | POA: Insufficient documentation

## 2017-03-15 DIAGNOSIS — F419 Anxiety disorder, unspecified: Secondary | ICD-10-CM | POA: Insufficient documentation

## 2017-03-15 DIAGNOSIS — R51 Headache: Secondary | ICD-10-CM | POA: Insufficient documentation

## 2017-03-15 DIAGNOSIS — Z331 Pregnant state, incidental: Secondary | ICD-10-CM

## 2017-03-15 DIAGNOSIS — Z3A28 28 weeks gestation of pregnancy: Secondary | ICD-10-CM | POA: Insufficient documentation

## 2017-03-15 LAB — URINALYSIS, ROUTINE W REFLEX MICROSCOPIC
BACTERIA UA: NONE SEEN
Bilirubin Urine: NEGATIVE
GLUCOSE, UA: NEGATIVE mg/dL
HGB URINE DIPSTICK: NEGATIVE
Ketones, ur: 5 mg/dL — AB
NITRITE: NEGATIVE
Protein, ur: NEGATIVE mg/dL
SPECIFIC GRAVITY, URINE: 1.015 (ref 1.005–1.030)
pH: 6 (ref 5.0–8.0)

## 2017-03-15 MED ORDER — ONDANSETRON 8 MG PO TBDP
8.0000 mg | ORAL_TABLET | Freq: Once | ORAL | Status: AC | PRN
  Administered 2017-03-15: 8 mg via ORAL
  Filled 2017-03-15: qty 1

## 2017-03-15 MED ORDER — LOPERAMIDE HCL 2 MG PO CAPS
4.0000 mg | ORAL_CAPSULE | Freq: Once | ORAL | Status: AC
  Administered 2017-03-15: 4 mg via ORAL
  Filled 2017-03-15: qty 2

## 2017-03-15 NOTE — MAU Note (Signed)
Pt reports abdominal cramping that started around 1800. States she had 3 episodes of diarrhea that started around 1930. States son was recently sick with stomach virus. Pt denies vomiting or fever. Pt reports fetal movement today. Pt denies vaginal bleeding or LOF.

## 2017-03-16 DIAGNOSIS — R197 Diarrhea, unspecified: Secondary | ICD-10-CM | POA: Diagnosis not present

## 2017-03-16 DIAGNOSIS — O9989 Other specified diseases and conditions complicating pregnancy, childbirth and the puerperium: Secondary | ICD-10-CM

## 2017-03-16 MED ORDER — LOPERAMIDE HCL 2 MG PO TABS
2.0000 mg | ORAL_TABLET | Freq: Four times a day (QID) | ORAL | 0 refills | Status: DC | PRN
Start: 2017-03-16 — End: 2017-05-25

## 2017-03-16 MED ORDER — ONDANSETRON 8 MG PO TBDP: 8.0000 mg | ORAL_TABLET | Freq: Three times a day (TID) | ORAL | 1 refills | Status: DC | PRN

## 2017-03-16 NOTE — MAU Provider Note (Signed)
Chief Complaint:  Abdominal Pain   First Provider Initiated Contact with Patient 03/15/17 2255     HPI: Beth [name] is a 31 y.o. G2P1001 at [redacted]w[redacted]d who presents to maternity admissions reporting upper abd cramping and diarrhea x 3. Son has had diarrhea the past few days too. Able to eat and drink well.   Location: upper abd Quality: cramping, sharp Severity: 10/10 in pain scale Duration: 6 hours Timing: intermittent Modifying factors: None. There are no aggravating or aleviating factors. Associated signs and symptoms: neg for contractions, fever, chills, N/V, vaginal bleeding, urinary complaints  Good fetal movement.   Pregnancy Course: Uncomplicated.   Past Medical History:  Diagnosis Date  . Anemia   . Anxiety   . Bulging lumbar disc   . Headache    otc med prn - last one 4 months ago  . History of blood transfusion    after 04/02/2016 svd at [facility]  . SVD (spontaneous vaginal delivery)    x 1   OB History  Gravida Para Term Preterm AB Living  2 1 1     1   SAB TAB Ectopic Multiple Live Births        0 1    # Outcome Date GA Lbr Len/2nd Weight Sex Delivery Anes PTL Lv  2 Current           1 Term 04/02/16 [redacted]w[redacted]d 17:52 / 00:49 8 lb 3.6 oz (3.73 kg) M Vag-Spont EPI  LIV     Past Surgical History:  Procedure Laterality Date  . DILATION AND CURETTAGE OF UTERUS N/A 06/12/2016   Procedure: DILATATION AND CURETTAGE;  Surgeon: Sanjuana Kava, MD;  Location: [facility];  Service: Gynecology;  Laterality: N/A;  . WISDOM TOOTH EXTRACTION     Family History  Problem Relation Age of Onset  . Hypertension Other   . Cancer Other    Social History  Substance Use Topics  . Smoking status: Never Smoker  . Smokeless tobacco: Never Used  . Alcohol use No     Comment: occasional wine   Allergies  Allergen Reactions  . Kiwi Extract Anaphylaxis  . Penicillins Other (See Comments)    Never ending infection cycle Has patient had a PCN reaction causing immediate rash, facial/tongue/throat  swelling, SOB or lightheadedness with hypotension: no Has patient had a PCN reaction causing severe rash involving mucus membranes or skin necrosis: no Has patient had a PCN reaction that required hospitalization no Has patient had a PCN reaction occurring within the last 10 years: yes If all of the above answers are "NO", then may proceed with Cephalosporin use.   [place] (Diagnostic) Itching   Prescriptions Prior to Admission  Medication Sig Dispense Refill Last Dose  . aspirin-acetaminophen-caffeine (EXCEDRIN MIGRAINE) 250-250-65 MG tablet Take 2 tablets by mouth every 6 (six) hours as needed for headache.   Past Month at Unknown time  . docusate sodium (COLACE) 100 MG capsule Take 1 capsule (100 mg total) by mouth 2 (two) times daily. (Patient taking differently: Take 100 mg by mouth daily as needed for mild constipation. ) 60 capsule 0 Past Month at Unknown time  . ibuprofen (ADVIL,MOTRIN) 600 MG tablet Take 1 tablet (600 mg total) by mouth every 6 (six) hours as needed. 90 tablet 0   . oxyCODONE-acetaminophen (ROXICET) 5-325 MG tablet Take 1-2 tablets by mouth every 4 (four) hours as needed. May take 1-2 tablets every 4-6 hours as needed for pain 20 tablet 0   . Prenatal Vit-Fe Fumarate-FA (  PRENATAL MULTIVITAMIN) TABS tablet Take 1 tablet by mouth daily at 12 noon.    06/11/2016 at Unknown time  . sertraline (ZOLOFT) 50 MG tablet Take 50 mg by mouth daily.   06/11/2016 at Unknown time    I have reviewed patient's Past Medical Hx, Surgical Hx, Family Hx, Social Hx, medications and allergies.   ROS:  Review of Systems  Constitutional: Negative for appetite change, chills and fever.  Gastrointestinal: Positive for abdominal pain and diarrhea. Negative for abdominal distention, blood in stool, constipation, nausea and vomiting.  Genitourinary: Negative for dysuria, vaginal bleeding and vaginal discharge.  Musculoskeletal: Negative for back pain.  Neurological: Negative for dizziness  and weakness.    Physical Exam   Patient Vitals for the past 24 hrs:  BP Temp Temp src Pulse Resp  03/16/17 0100 (!) 107/51 98.2 F (36.8 C) Oral 98 -  03/15/17 2300 127/71 - - 96 -  03/15/17 2231 103/90 - - 97 -  03/15/17 2216 122/70 - - 84 -  03/15/17 2201 124/80 - - 89 -  03/15/17 2155 (!) 142/83 (wrong cuff) 97.8 F (36.6 C) Oral 91 20   Constitutional: Well-developed, well-nourished female in mild, intermittent distress.  Cardiovascular: normal rate Respiratory: normal effort GI: Abd soft, non-tender, gravid appropriate for gestational age. Pos BS x 4 MS: Extremities nontender, no edema, normal ROM Neurologic: Alert and oriented x 4.  GU: Neg CVAT.  Pelvic: NEFG, physiologic discharge, no blood, cervix clean. No CMT  Dilation: Closed Effacement (%): Thick Exam by:: Marlou Porch, cnm  FHT:  Baseline 145 , moderate variability, accelerations present, no decelerations Contractions: UI   Labs: Results for orders placed or performed during the hospital encounter of 03/15/17 (from the past 24 hour(s))  Urinalysis, Routine w reflex microscopic     Status: Abnormal   Collection Time: 03/15/17  9:48 PM  Result Value Ref Range   Color, Urine YELLOW YELLOW   APPearance HAZY (A) CLEAR   Specific Gravity, Urine 1.015 1.005 - 1.030   pH 6.0 5.0 - 8.0   Glucose, UA NEGATIVE NEGATIVE mg/dL   Hgb urine dipstick NEGATIVE NEGATIVE   Bilirubin Urine NEGATIVE NEGATIVE   Ketones, ur 5 (A) NEGATIVE mg/dL   Protein, ur NEGATIVE NEGATIVE mg/dL   Nitrite NEGATIVE NEGATIVE   Leukocytes, UA TRACE (A) NEGATIVE   RBC / HPF 0-5 0 - 5 RBC/hpf   WBC, UA 0-5 0 - 5 WBC/hpf   Bacteria, UA NONE SEEN NONE SEEN   Squamous Epithelial / LPF 0-5 (A) NONE SEEN   Mucous PRESENT     Imaging:  No results found.  MAU Course: Orders Placed This Encounter  Procedures  . Urinalysis, Routine w reflex microscopic  . Fetal fibronectin  . Vital signs  . Enteric precautions (UV disinfection)  . Discharge  patient   Meds ordered this encounter  Medications  . loperamide (IMODIUM) capsule 4 mg  . ondansetron (ZOFRAN-ODT) disintegrating tablet 8 mg  . ondansetron (ZOFRAN ODT) 8 MG disintegrating tablet    Sig: Take 1 tablet (8 mg total) by mouth every 8 (eight) hours as needed for nausea or vomiting.    Dispense:  20 tablet    Refill:  1    Order Specific Question:   Supervising Provider    Answer:   Elonda Husky, LUTHER H [[id number]]  . loperamide (IMODIUM A-D) 2 MG tablet    Sig: Take 1 tablet (2 mg total) by mouth 4 (four) times daily as needed for diarrhea or  loose stools.    Dispense:  30 tablet    Refill:  0    Order Specific Question:   Supervising Provider    Answer:   Elonda Husky, LUTHER H [[id number]]   Vomited once in MAU.   Discussed Hx, labs, exam w/ Dr. Alwyn Pea. Agrees w/ POC. New orders: Zofran.   MDM: - Abd cramping 2/2 diarrhea, likely infectious. Tolerating POs.   Assessment: 1. Diarrhea of presumed infectious origin   2. Pregnant state, incidental     Plan: Discharge home in stable condition per consult w/ Dr. Alwyn Pea.  Preterm Labor precautions and fetal kick counts Push fluids. Binding diet.  Follow-up Information    Sanjuana Kava, MD Follow up on 03/31/2017.   Specialty:  Obstetrics and Gynecology Why:  as scheduled or sooner as needed if symptoms worsen Contact information: [place] [address] [place] Follow up.   Why:  in pregnancy emergencies Contact information: [address] [id number] [place] [phone]          Allergies as of 03/16/2017      Reactions   Kiwi Extract Anaphylaxis   Penicillins Other (See Comments)   Never ending infection cycle Has patient had a PCN reaction causing immediate rash, facial/tongue/throat swelling, SOB or lightheadedness with hypotension: no Has patient had a PCN reaction causing severe rash  involving mucus membranes or skin necrosis: no Has patient had a PCN reaction that required hospitalization no Has patient had a PCN reaction occurring within the last 10 years: yes If all of the above answers are "NO", then may proceed with Cephalosporin use.   Strawberry (diagnostic) Itching      Medication List    STOP taking these medications   aspirin-acetaminophen-caffeine 250-250-65 MG tablet Commonly known as:  EXCEDRIN MIGRAINE   ibuprofen 600 MG tablet Commonly known as:  ADVIL,MOTRIN     TAKE these medications   docusate sodium 100 MG capsule Commonly known as:  COLACE Take 1 capsule (100 mg total) by mouth 2 (two) times daily. What changed:  when to take this  reasons to take this   loperamide 2 MG tablet Commonly known as:  IMODIUM A-D Take 1 tablet (2 mg total) by mouth 4 (four) times daily as needed for diarrhea or loose stools.   ondansetron 8 MG disintegrating tablet Commonly known as:  ZOFRAN ODT Take 1 tablet (8 mg total) by mouth every 8 (eight) hours as needed for nausea or vomiting.   oxyCODONE-acetaminophen 5-325 MG tablet Commonly known as:  ROXICET Take 1-2 tablets by mouth every 4 (four) hours as needed. May take 1-2 tablets every 4-6 hours as needed for pain   prenatal multivitamin Tabs tablet Take 1 tablet by mouth daily at 12 noon.   sertraline 50 MG tablet Commonly known as:  ZOLOFT Take 50 mg by mouth daily.       Tamala Julian, [place], [place] 03/16/2017 12:52 AM

## 2017-03-16 NOTE — Discharge Instructions (Signed)
Food Choices to Help Relieve Diarrhea, Adult °When you have diarrhea, the foods you eat and your eating habits are very important. Choosing the right foods and drinks can help: °· Relieve diarrhea. °· Replace lost fluids and nutrients. °· Prevent dehydration. °What general guidelines should I follow? °Relieving diarrhea  °· Choose foods with less than 2 g or .07 oz. of fiber per serving. °· Limit fats to less than 8 tsp (38 g or 1.34 oz.) a day. °· Avoid the following: °¨ Foods and beverages sweetened with high-fructose corn syrup, honey, or sugar alcohols such as xylitol, sorbitol, and mannitol. °¨ Foods that contain a lot of fat or sugar. °¨ Fried, greasy, or spicy foods. °¨ High-fiber grains, breads, and cereals. °¨ Raw fruits and vegetables. °· Eat foods that are rich in probiotics. These foods include dairy products such as yogurt and fermented milk products. They help increase healthy bacteria in the stomach and intestines (gastrointestinal tract, or GI tract). °· If you have lactose intolerance, avoid dairy products. These may make your diarrhea worse. °· Take medicine to help stop diarrhea (antidiarrheal medicine) only as told by your health care provider. °Replacing nutrients  °· Eat small meals or snacks every 3-4 hours. °· Eat bland foods, such as white rice, toast, or baked potato, until your diarrhea starts to get better. Gradually reintroduce nutrient-rich foods as tolerated or as told by your health care provider. This includes: °¨ Well-cooked protein foods. °¨ Peeled, seeded, and soft-cooked fruits and vegetables. °¨ Low-fat dairy products. °· Take vitamin and mineral supplements as told by your health care provider. °Preventing dehydration  ° °· Start by sipping water or a special solution to prevent dehydration (oral rehydration solution, ORS). Urine that is clear or pale yellow means that you are getting enough fluid. °· Try to drink at least 8-10 cups of fluid each day to help replace lost  fluids. °· You may add other liquids in addition to water, such as clear juice or decaffeinated sports drinks, as tolerated or as told by your health care provider. °· Avoid drinks with caffeine, such as coffee, tea, or soft drinks. °· Avoid alcohol. °What foods are recommended? °The items listed may not be a complete list. Talk with your health care provider about what dietary choices are best for you. °Grains  °White rice. White, French, or pita breads (fresh or toasted), including plain rolls, buns, or bagels. White pasta. Saltine, soda, or graham crackers. Pretzels. Low-fiber cereal. Cooked cereals made with water (such as cornmeal, farina, or cream cereals). Plain muffins. Matzo. Melba toast. Zwieback. °Vegetables  °Potatoes (without the skin). Most well-cooked and canned vegetables without skins or seeds. Tender lettuce. °Fruits  °Apple sauce. Fruits canned in juice. Cooked apricots, cherries, grapefruit, peaches, pears, or plums. Fresh bananas and cantaloupe. °Meats and other protein foods  °Baked or boiled chicken. Eggs. Tofu. Fish. Seafood. Smooth nut butters. Ground or well-cooked tender beef, ham, veal, lamb, pork, or poultry. °Dairy  °Plain yogurt, kefir, and unsweetened liquid yogurt. Lactose-free milk, buttermilk, skim milk, or soy milk. Low-fat or nonfat hard cheese. °Beverages  °Water. Low-calorie sports drinks. Fruit juices without pulp. Strained tomato and vegetable juices. Decaffeinated teas. Sugar-free beverages not sweetened with sugar alcohols. Oral rehydration solutions, if approved by your health care provider. °Seasoning and other foods  °Bouillon, broth, or soups made from recommended foods. °What foods are not recommended? °The items listed may not be a complete list. Talk with your health care provider about what dietary choices   are best for you. °Grains  °Whole grain, whole wheat, bran, or rye breads, rolls, pastas, and crackers. Wild or brown rice. Whole grain or bran cereals. Barley.  Oats and oatmeal. Corn tortillas or taco shells. Granola. Popcorn. °Vegetables  °Raw vegetables. Fried vegetables. Cabbage, broccoli, Brussels sprouts, artichokes, baked beans, beet greens, corn, kale, legumes, peas, sweet potatoes, and yams. Potato skins. Cooked spinach and cabbage. °Fruits  °Dried fruit, including raisins and dates. Raw fruits. Stewed or dried prunes. Canned fruits with syrup. °Meat and other protein foods  °Fried or fatty meats. Deli meats. Chunky nut butters. Nuts and seeds. Beans and lentils. Bacon. Hot dogs. Sausage. °Dairy  °High-fat cheeses. Whole milk, chocolate milk, and beverages made with milk, such as milk shakes. Half-and-half. Cream. sour cream. Ice cream. °Beverages  °Caffeinated beverages (such as coffee, tea, soda, or energy drinks). Alcoholic beverages. Fruit juices with pulp. Prune juice. Soft drinks sweetened with high-fructose corn syrup or sugar alcohols. High-calorie sports drinks. °Fats and oils  °Butter. Cream sauces. Margarine. Salad oils. Plain salad dressings. Olives. Avocados. Mayonnaise. °Sweets and desserts  °Sweet rolls, doughnuts, and sweet breads. Sugar-free desserts sweetened with sugar alcohols such as xylitol and sorbitol. °Seasoning and other foods  °Honey. Hot sauce. Chili powder. Gravy. Cream-based or milk-based soups. Pancakes and waffles. °Summary °· When you have diarrhea, the foods you eat and your eating habits are very important. °· Make sure you get at least 8-10 cups of fluid each day, or enough to keep your urine clear or pale yellow. °· Eat bland foods and gradually reintroduce healthy, nutrient-rich foods as tolerated, or as told by your health care provider. °· Avoid high-fiber, fried, greasy, or spicy foods. °This information is not intended to replace advice given to you by your health care provider. Make sure you discuss any questions you have with your health care provider. °Document Released: 01/03/2004 Document Revised: 10/10/2016 Document  Reviewed: 10/10/2016 °Elsevier Interactive Patient Education © 2017 Elsevier Inc. °Diarrhea, Adult °Diarrhea is frequent loose and watery bowel movements. Diarrhea can make you feel weak and cause you to become dehydrated. Dehydration can make you tired and thirsty, cause you to have a dry mouth, and decrease how often you urinate. Diarrhea typically lasts 2-3 days. However, it can last longer if it is a sign of something more serious. It is important to treat your diarrhea as told by your health care provider. °Follow these instructions at home: °Eating and drinking  ° °Follow these recommendations as told by your health care provider: °· Take an oral rehydration solution (ORS). This is a drink that is sold at pharmacies and retail stores. °· Drink clear fluids, such as water, ice chips, diluted fruit juice, and low-calorie sports drinks. °· Eat bland, easy-to-digest foods in small amounts as you are able. These foods include bananas, applesauce, rice, lean meats, toast, and crackers. °· Avoid drinking fluids that contain a lot of sugar or caffeine, such as energy drinks, sports drinks, and soda. °· Avoid alcohol. °· Avoid spicy or fatty foods. °General instructions  °· Drink enough fluid to keep your urine clear or pale yellow. °· Wash your hands often. If soap and water are not available, use hand sanitizer. °· Make sure that all people in your household wash their hands well and often. °· Take over-the-counter and prescription medicines only as told by your health care provider. °· Rest at home while you recover. °· Watch your condition for any changes. °· Take a warm bath to relieve any   burning or pain from frequent diarrhea episodes. °· Keep all follow-up visits as told by your health care provider. This is important. °Contact a health care provider if: °· You have a fever. °· Your diarrhea gets worse. °· You have new symptoms. °· You cannot keep fluids down. °· You feel light-headed or dizzy. °· You have a  headache °· You have muscle cramps. °Get help right away if: °· You have chest pain. °· You feel extremely weak or you faint. °· You have bloody or black stools or stools that look like tar. °· You have severe pain, cramping, or bloating in your abdomen. °· You have trouble breathing or you are breathing very quickly. °· Your heart is beating very quickly. °· Your skin feels cold and clammy. °· You feel confused. °· You have signs of dehydration, such as: °¨ Dark urine, very little urine, or no urine. °¨ Cracked lips. °¨ Dry mouth. °¨ Sunken eyes. °¨ Sleepiness. °¨ Weakness. °This information is not intended to replace advice given to you by your health care provider. Make sure you discuss any questions you have with your health care provider. °Document Released: 10/03/2002 Document Revised: 02/21/2016 Document Reviewed: 06/19/2015 °Elsevier Interactive Patient Education © 2017 Elsevier Inc. ° °

## 2017-05-05 LAB — OB RESULTS CONSOLE GBS: GBS: NEGATIVE

## 2017-05-25 ENCOUNTER — Encounter (HOSPITAL_COMMUNITY): Payer: Self-pay | Admitting: *Deleted

## 2017-05-25 ENCOUNTER — Inpatient Hospital Stay (HOSPITAL_COMMUNITY)
Admission: AD | Admit: 2017-05-25 | Discharge: 2017-05-29 | DRG: 765 | Disposition: A | Discharge status: Home / Self Care | Payer: Managed Care, Other (non HMO) | Source: Ambulatory Visit | Attending: Obstetrics & Gynecology | Admitting: Obstetrics & Gynecology

## 2017-05-25 ENCOUNTER — Telehealth (HOSPITAL_COMMUNITY): Payer: Self-pay | Admitting: *Deleted

## 2017-05-25 DIAGNOSIS — Z3A38 38 weeks gestation of pregnancy: Secondary | ICD-10-CM | POA: Diagnosis not present

## 2017-05-25 DIAGNOSIS — O99214 Obesity complicating childbirth: Secondary | ICD-10-CM | POA: Diagnosis present

## 2017-05-25 DIAGNOSIS — O3663X0 Maternal care for excessive fetal growth, third trimester, not applicable or unspecified: Secondary | ICD-10-CM | POA: Diagnosis present

## 2017-05-25 DIAGNOSIS — O1494 Unspecified pre-eclampsia, complicating childbirth: Principal | ICD-10-CM | POA: Diagnosis present

## 2017-05-25 DIAGNOSIS — O1493 Unspecified pre-eclampsia, third trimester: Secondary | ICD-10-CM

## 2017-05-25 DIAGNOSIS — Z6841 Body Mass Index (BMI) 40.0 and over, adult: Secondary | ICD-10-CM

## 2017-05-25 DIAGNOSIS — Z98891 History of uterine scar from previous surgery: Secondary | ICD-10-CM

## 2017-05-25 LAB — COMPREHENSIVE METABOLIC PANEL
ALT: 14 U/L (ref 14–54)
ANION GAP: 8 (ref 5–15)
AST: 25 U/L (ref 15–41)
Albumin: 2.8 g/dL — ABNORMAL LOW (ref 3.5–5.0)
Alkaline Phosphatase: 175 U/L — ABNORMAL HIGH (ref 38–126)
BUN: 7 mg/dL (ref 6–20)
CHLORIDE: 107 mmol/L (ref 101–111)
CO2: 21 mmol/L — AB (ref 22–32)
Calcium: 10.5 mg/dL — ABNORMAL HIGH (ref 8.9–10.3)
Creatinine, Ser: 0.65 mg/dL (ref 0.44–1.00)
GFR calc non Af Amer: >60 mL/min (ref >60)
Glucose, Bld: 81 mg/dL (ref 65–99)
POTASSIUM: 3.9 mmol/L (ref 3.5–5.1)
SODIUM: 136 mmol/L (ref 135–145)
Total Bilirubin: 0.9 mg/dL (ref 0.3–1.2)
Total Protein: 5.9 g/dL — ABNORMAL LOW (ref 6.5–8.1)

## 2017-05-25 LAB — PROTEIN / CREATININE RATIO, URINE
Creatinine, Urine: 201 mg/dL
PROTEIN CREATININE RATIO: 0.13 mg/mg{creat} (ref 0.00–0.15)
TOTAL PROTEIN, URINE: 26 mg/dL

## 2017-05-25 LAB — URINALYSIS, ROUTINE W REFLEX MICROSCOPIC
BILIRUBIN URINE: NEGATIVE
Glucose, UA: NEGATIVE mg/dL
Hgb urine dipstick: NEGATIVE
KETONES UR: NEGATIVE mg/dL
Nitrite: NEGATIVE
PROTEIN: 30 mg/dL — AB
RBC / HPF: NONE SEEN RBC/hpf (ref 0–5)
Specific Gravity, Urine: 1.016 (ref 1.005–1.030)
pH: 6 (ref 5.0–8.0)

## 2017-05-25 LAB — CBC
HCT: 33.2 % — ABNORMAL LOW (ref 36.0–46.0)
HEMOGLOBIN: 10.7 g/dL — AB (ref 12.0–15.0)
MCH: 25.1 pg — AB (ref 26.0–34.0)
MCHC: 32.2 g/dL (ref 30.0–36.0)
MCV: 77.9 fL — AB (ref 78.0–100.0)
Platelets: 181 10*3/uL (ref 150–400)
RBC: 4.26 MIL/uL (ref 3.87–5.11)
RDW: 16.4 % — ABNORMAL HIGH (ref 11.5–15.5)
WBC: 6.9 10*3/uL (ref 4.0–10.5)

## 2017-05-25 MED ORDER — OXYTOCIN 40 UNITS IN LACTATED RINGERS INFUSION - SIMPLE MED
INTRAVENOUS | Status: AC
  Administered 2017-05-25: 2 m[IU]/min via INTRAVENOUS
  Filled 2017-05-25: qty 1000

## 2017-05-25 MED ORDER — LIDOCAINE HCL (PF) 1 % IJ SOLN: 30.0000 mL | INTRAMUSCULAR | Status: DC | PRN

## 2017-05-25 MED ORDER — OXYTOCIN BOLUS FROM INFUSION: 500.0000 mL | Freq: Once | INTRAVENOUS | Status: DC

## 2017-05-25 MED ORDER — OXYTOCIN 40 UNITS IN LACTATED RINGERS INFUSION - SIMPLE MED
1.0000 m[IU]/min | INTRAVENOUS | Status: DC
  Administered 2017-05-25: 2 m[IU]/min via INTRAVENOUS

## 2017-05-25 MED ORDER — LABETALOL HCL 5 MG/ML IV SOLN
20.0000 mg | INTRAVENOUS | Status: DC | PRN
  Administered 2017-05-25: 20 mg via INTRAVENOUS
  Filled 2017-05-25: qty 4

## 2017-05-25 MED ORDER — LACTATED RINGERS IV SOLN
INTRAVENOUS | Status: DC
  Administered 2017-05-25 (×2): via INTRAVENOUS

## 2017-05-25 MED ORDER — ONDANSETRON HCL 4 MG/2ML IJ SOLN: 4.0000 mg | Freq: Four times a day (QID) | INTRAMUSCULAR | Status: DC | PRN

## 2017-05-25 MED ORDER — LACTATED RINGERS IV SOLN: 500.0000 mL | Freq: Once | INTRAVENOUS | Status: DC

## 2017-05-25 MED ORDER — EPHEDRINE 5 MG/ML INJ: 10.0000 mg | INTRAVENOUS | Status: DC | PRN

## 2017-05-25 MED ORDER — TERBUTALINE SULFATE 1 MG/ML IJ SOLN: 0.2500 mg | Freq: Once | INTRAMUSCULAR | Status: DC | PRN

## 2017-05-25 MED ORDER — DIPHENHYDRAMINE HCL 50 MG/ML IJ SOLN: 12.5000 mg | INTRAMUSCULAR | Status: DC | PRN

## 2017-05-25 MED ORDER — OXYCODONE-ACETAMINOPHEN 5-325 MG PO TABS: 2.0000 | ORAL_TABLET | ORAL | Status: DC | PRN

## 2017-05-25 MED ORDER — LACTATED RINGERS IV SOLN: 500.0000 mL | INTRAVENOUS | Status: DC | PRN

## 2017-05-25 MED ORDER — FENTANYL 2.5 MCG/ML BUPIVACAINE 1/10 % EPIDURAL INFUSION (WH - ANES): 14.0000 mL/h | INTRAMUSCULAR | Status: DC | PRN

## 2017-05-25 MED ORDER — OXYTOCIN 40 UNITS IN LACTATED RINGERS INFUSION - SIMPLE MED: 2.5000 [IU]/h | INTRAVENOUS | Status: DC

## 2017-05-25 MED ORDER — PHENYLEPHRINE 40 MCG/ML (10ML) SYRINGE FOR IV PUSH (FOR BLOOD PRESSURE SUPPORT): 80.0000 ug | PREFILLED_SYRINGE | INTRAVENOUS | Status: DC | PRN

## 2017-05-25 MED ORDER — SOD CITRATE-CITRIC ACID 500-334 MG/5ML PO SOLN
30.0000 mL | ORAL | Status: DC | PRN
  Administered 2017-05-25 – 2017-05-26 (×3): 30 mL via ORAL
  Filled 2017-05-25 (×3): qty 15

## 2017-05-25 MED ORDER — LACTATED RINGERS IV SOLN
INTRAVENOUS | Status: DC
  Administered 2017-05-26: 11:00:00 via INTRAVENOUS

## 2017-05-25 MED ORDER — ACETAMINOPHEN 325 MG PO TABS: 650.0000 mg | ORAL_TABLET | ORAL | Status: DC | PRN

## 2017-05-25 MED ORDER — HYDRALAZINE HCL 20 MG/ML IJ SOLN: 10.0000 mg | Freq: Once | INTRAMUSCULAR | Status: DC | PRN

## 2017-05-25 MED ORDER — OXYCODONE-ACETAMINOPHEN 5-325 MG PO TABS: 1.0000 | ORAL_TABLET | ORAL | Status: DC | PRN

## 2017-05-25 NOTE — MAU Provider Note (Signed)
History     CSN: [id number]  Arrival date and time: 05/25/17 1526   First Provider Initiated Contact with Patient 05/25/17 1545      Chief Complaint  Patient presents with  . Hypertension  . Contractions   Beth [name] is a 31 y.o. G2P1001 at [redacted]w[redacted]d who presents today from the office D/T elevated blood pressure. She states that her blood pressure was 162/109 today. She denies any hx of hypertension. She states that she did not have preeclampsia with her last pregnancy, but did have a "couple of elevated blood pressures". She denies any VB or LOF. She confirms normal fetal movement. She has had off and contractions.    Hypertension  This is a new problem. The current episode started today. The problem is unchanged. Pertinent negatives include no headaches. Associated agents: pregnancy  There are no known risk factors for coronary artery disease. Past treatments include nothing.   Past Medical History:  Diagnosis Date  . Anemia   . Anxiety   . Bulging lumbar disc   . Headache    otc med prn - last one 4 months ago  . History of blood transfusion    after 04/02/2016 svd at [facility]  . SVD (spontaneous vaginal delivery)    x 1    Past Surgical History:  Procedure Laterality Date  . DILATION AND CURETTAGE OF UTERUS N/A 06/12/2016   Procedure: DILATATION AND CURETTAGE;  Surgeon: Sanjuana Kava, MD;  Location: [facility];  Service: Gynecology;  Laterality: N/A;  . WISDOM TOOTH EXTRACTION      Family History  Problem Relation Age of Onset  . Hypertension Other   . Cancer Other     Social History  Substance Use Topics  . Smoking status: Never Smoker  . Smokeless tobacco: Never Used  . Alcohol use No     Comment: occasional wine    Allergies:  Allergies  Allergen Reactions  . Kiwi Extract Anaphylaxis  . Penicillins Other (See Comments)    Never ending infection cycle Has patient had a PCN reaction causing immediate rash, facial/tongue/throat swelling, SOB or lightheadedness with  hypotension: no Has patient had a PCN reaction causing severe rash involving mucus membranes or skin necrosis: no Has patient had a PCN reaction that required hospitalization no Has patient had a PCN reaction occurring within the last 10 years: yes If all of the above answers are "NO", then may proceed with Cephalosporin use.   [place] (Diagnostic) Itching    Prescriptions Prior to Admission  Medication Sig Dispense Refill Last Dose  . docusate sodium (COLACE) 100 MG capsule Take 1 capsule (100 mg total) by mouth 2 (two) times daily. (Patient taking differently: Take 100 mg by mouth daily as needed for mild constipation. ) 60 capsule 0 Past Month at Unknown time  . loperamide (IMODIUM A-D) 2 MG tablet Take 1 tablet (2 mg total) by mouth 4 (four) times daily as needed for diarrhea or loose stools. 30 tablet 0   . ondansetron (ZOFRAN ODT) 8 MG disintegrating tablet Take 1 tablet (8 mg total) by mouth every 8 (eight) hours as needed for nausea or vomiting. 20 tablet 1   . oxyCODONE-acetaminophen (ROXICET) 5-325 MG tablet Take 1-2 tablets by mouth every 4 (four) hours as needed. May take 1-2 tablets every 4-6 hours as needed for pain 20 tablet 0   . Prenatal Vit-Fe Fumarate-FA (PRENATAL MULTIVITAMIN) TABS tablet Take 1 tablet by mouth daily at 12 noon.    06/11/2016 at Unknown  time  . sertraline (ZOLOFT) 50 MG tablet Take 50 mg by mouth daily.   06/11/2016 at Unknown time    Review of Systems  Constitutional: Negative for fever.  Eyes: Negative for visual disturbance.  Gastrointestinal: Negative for abdominal pain.  Genitourinary: Negative for vaginal bleeding and vaginal discharge.  Neurological: Negative for headaches.   Physical Exam   currently breastfeeding.  Physical Exam  Nursing note and vitals reviewed. Constitutional: She is oriented to person, place, and time. She appears well-developed and well-nourished. No distress.  HENT:  Head: Normocephalic.  Cardiovascular: Normal  rate.   Respiratory: Effort normal.  GI: Soft. There is no tenderness. There is no rebound.  Neurological: She is alert and oriented to person, place, and time. She has normal reflexes. She exhibits normal muscle tone (no clonus ).  Skin: Skin is warm and dry.  Psychiatric: She has a normal mood and affect.   Results for orders placed or performed during the hospital encounter of 05/25/17 (from the past 24 hour(s))  CBC     Status: Abnormal   Collection Time: 05/25/17  3:45 PM  Result Value Ref Range   WBC 6.9 4.0 - 10.5 K/uL   RBC 4.26 3.87 - 5.11 MIL/uL   Hemoglobin 10.7 (L) 12.0 - 15.0 g/dL   HCT 33.2 (L) 36.0 - 46.0 %   MCV 77.9 (L) 78.0 - 100.0 fL   MCH 25.1 (L) 26.0 - 34.0 pg   MCHC 32.2 30.0 - 36.0 g/dL   RDW 16.4 (H) 11.5 - 15.5 %   Platelets 181 150 - 400 K/uL  Urinalysis, Routine w reflex microscopic     Status: Abnormal   Collection Time: 05/25/17  3:45 PM  Result Value Ref Range   Color, Urine YELLOW YELLOW   APPearance CLEAR CLEAR   Specific Gravity, Urine 1.016 1.005 - 1.030   pH 6.0 5.0 - 8.0   Glucose, UA NEGATIVE NEGATIVE mg/dL   Hgb urine dipstick NEGATIVE NEGATIVE   Bilirubin Urine NEGATIVE NEGATIVE   Ketones, ur NEGATIVE NEGATIVE mg/dL   Protein, ur 30 (A) NEGATIVE mg/dL   Nitrite NEGATIVE NEGATIVE   Leukocytes, UA SMALL (A) NEGATIVE   RBC / HPF NONE SEEN 0 - 5 RBC/hpf   WBC, UA 0-5 0 - 5 WBC/hpf   Bacteria, UA RARE (A) NONE SEEN   Squamous Epithelial / LPF 0-5 (A) NONE SEEN   Mucous PRESENT    FHT: 150, moderate with 15x15 accels, no decels Toco: about every 3-4 mins   MAU Course  Procedures  MDM 1637: D/W Dr. Alwyn Pea will admit.   Assessment and Plan  Pre-eclampsia without severe features Admit to labor and delivery  Induction of labor  Marcille Buffy 05/25/2017, 3:54 PM

## 2017-05-25 NOTE — MAU Note (Signed)
Patient was seen in office today with elevated BPs 160s/110  And 140s/90s.  Pt denies HA, blurred vision, or seeing floaters.  Having some contractions.  Reports good fetal movement.  Denies LOF or vaginal bleeding.

## 2017-05-25 NOTE — H&P (Signed)
Beth [name] is a 30 y.o. female G2P1001 at 75 weeks 2 days admitted for preeclampsia versus gestational hypertension.  Patient was sent to MAU from office for elevated BPs in severe range.  Several abnormal BPs in MAU. Decrease in platelet count and proteinuria confirmed diagnosis.  Patient w/o BP issues up until this point.  She denies HA, no RUQ pain, no visual changes.    Prenatal care significant for Large for GA fetus.   OB History    Gravida Para Term Preterm AB Living   2 1 1     1    SAB TAB Ectopic Multiple Live Births         0 1     Past Medical History:  Diagnosis Date  . Anemia   . Anxiety   . Bulging lumbar disc   . Headache    otc med prn - last one 4 months ago  . History of blood transfusion    after 04/02/2016 svd at [facility]  . SVD (spontaneous vaginal delivery)    x 1   Past Surgical History:  Procedure Laterality Date  . DILATION AND CURETTAGE OF UTERUS N/A 06/12/2016   Procedure: DILATATION AND CURETTAGE;  Surgeon: Sanjuana Kava, MD;  Location: [facility];  Service: Gynecology;  Laterality: N/A;  . WISDOM TOOTH EXTRACTION     Family History: family history includes Cancer in her other; Hypertension in her other. Social History:  reports that she has never smoked. She has never used smokeless tobacco. She reports that she does not drink alcohol or use drugs.     Maternal Diabetes: No Genetic Screening: Normal Maternal Ultrasounds/Referrals: Normal Fetal Ultrasounds or other Referrals:  None Maternal Substance Abuse:  No Significant Maternal Medications:  None Significant Maternal Lab Results:  Lab values include: Group B Strep negative Other Comments:  Ultrasound last week; EFW 10 pounds  Review of Systems  Eyes: Negative.  Negative for blurred vision and double vision.  Gastrointestinal: Negative for abdominal pain.  Neurological: Negative for headaches.   Maternal Medical History:  Contractions: Frequency: irregular.   Perceived severity is mild.    Fetal  activity: Perceived fetal activity is normal.   Last perceived fetal movement was within the past hour.    Prenatal complications: Pre-eclampsia.   Prenatal Complications - Diabetes: none.    Dilation: 3 Effacement (%): 50 Station: -2 Exam by:: Rosana Hoes, RN  Blood pressure (!) 143/66, pulse 86, temperature 98.1 F (36.7 C), temperature source Oral, resp. rate 18, height 6' (1.829 m), weight (!) 149.2 kg (329 lb), SpO2 99 %, currently breastfeeding. Maternal Exam:  Abdomen: Fundal height is 41 cm.   Estimated fetal weight is 4700 grams.   Fetal presentation: vertex  Introitus: Normal vulva. Normal vagina.  Ferning test: not done.  Nitrazine test: not done. Amniotic fluid character: not assessed.  Pelvis: questionable for delivery.   Cervix: Cervix evaluated by digital exam.     Fetal Exam Fetal Monitor Review: Baseline rate: 140.  Variability: moderate (6-25 bpm).   Pattern: accelerations present and no decelerations.    Fetal State Assessment: Category I - tracings are normal.     Physical Exam  Nursing note and vitals reviewed. Constitutional: She appears well-developed and well-nourished.    Prenatal labs: ABO, Rh: --/--/O POS (07/30 1600) Antibody: NEG (07/30 1600) Rubella: Immune (01/30 0000) RPR: Nonreactive (01/30 0000)  HBsAg: Negative (01/30 0000)  HIV: Non-reactive (01/30 0000)  GBS: Negative (07/10 0000)   Assessment/Plan: 31 year old G2P1 at  38 weeks with preeclampsia vs Gestational hypertension Admit to L&D Induction of labor with Pitocin Epidural on demand Continuous monitoring Discussed with patient if there is any evidence of labor dystocia will proceed with primary cesarean delivery due to [place] fetus  Murry Diaz, [place] 05/25/2017, 6:11 PM

## 2017-05-25 NOTE — Anesthesia Pain Management Evaluation Note (Signed)
  CRNA Pain Management Visit Note  Patient: Beth [name], 31 y.o., female  "Hello I am a member of the anesthesia team at [facility] We have an anesthesia team available at all times to provide care throughout the hospital, including epidural management and anesthesia for C-section. I don't know your plan for the delivery whether it a natural birth, water birth, IV sedation, nitrous supplementation, doula or epidural, but we want to meet your pain goals."   1.Was your pain managed to your expectations on prior hospitalizations?   Yes   2.What is your expectation for pain management during this hospitalization?     Epidural  3.How can we help you reach that goal? epidural  Record the patient's initial score and the patient's pain goal.   Pain: 0  Pain Goal: 5 [facility] wants you to be able to say your pain was always managed very well.  Rotunda Worden 05/25/2017

## 2017-05-25 NOTE — Telephone Encounter (Signed)
Preadmission screen  

## 2017-05-26 ENCOUNTER — Inpatient Hospital Stay (HOSPITAL_COMMUNITY): Payer: Managed Care, Other (non HMO) | Admitting: Anesthesiology

## 2017-05-26 ENCOUNTER — Encounter (HOSPITAL_COMMUNITY)
Admission: AD | Disposition: A | Discharge status: Home / Self Care | Payer: Self-pay | Source: Ambulatory Visit | Attending: Obstetrics & Gynecology

## 2017-05-26 ENCOUNTER — Encounter (HOSPITAL_COMMUNITY): Payer: Self-pay | Admitting: Anesthesiology

## 2017-05-26 DIAGNOSIS — Z98891 History of uterine scar from previous surgery: Secondary | ICD-10-CM

## 2017-05-26 LAB — CBC
HCT: 28.3 % — ABNORMAL LOW (ref 36.0–46.0)
HEMATOCRIT: 30.7 % — AB (ref 36.0–46.0)
HEMOGLOBIN: 9.9 g/dL — AB (ref 12.0–15.0)
Hemoglobin: 9.1 g/dL — ABNORMAL LOW (ref 12.0–15.0)
MCH: 25 pg — AB (ref 26.0–34.0)
MCH: 25.2 pg — ABNORMAL LOW (ref 26.0–34.0)
MCHC: 32.2 g/dL (ref 30.0–36.0)
MCHC: 32.2 g/dL (ref 30.0–36.0)
MCV: 77.5 fL — AB (ref 78.0–100.0)
MCV: 78.4 fL (ref 78.0–100.0)
PLATELETS: 169 10*3/uL (ref 150–400)
Platelets: 158 10*3/uL (ref 150–400)
RBC: 3.96 MIL/uL (ref 3.87–5.11)
RBC: 3.61 MIL/uL — ABNORMAL LOW (ref 3.87–5.11)
RDW: 16.4 % — AB (ref 11.5–15.5)
RDW: 16.5 % — AB (ref 11.5–15.5)
WBC: 5.5 10*3/uL (ref 4.0–10.5)
WBC: 6.4 10*3/uL (ref 4.0–10.5)

## 2017-05-26 LAB — RPR: RPR Ser Ql: NONREACTIVE

## 2017-05-26 LAB — PREPARE RBC (CROSSMATCH)

## 2017-05-26 SURGERY — Surgical Case
Anesthesia: Spinal | Wound class: Clean Contaminated

## 2017-05-26 MED ORDER — OXYTOCIN 40 UNITS IN LACTATED RINGERS INFUSION - SIMPLE MED
2.5000 [IU]/h | INTRAVENOUS | Status: AC
  Administered 2017-05-26: 2.5 [IU]/h via INTRAVENOUS
  Filled 2017-05-26: qty 1000

## 2017-05-26 MED ORDER — PRENATAL MULTIVITAMIN CH
1.0000 | ORAL_TABLET | Freq: Every day | ORAL | Status: DC
  Administered 2017-05-27 – 2017-05-29 (×3): 1 via ORAL
  Filled 2017-05-26 (×5): qty 1

## 2017-05-26 MED ORDER — LACTATED RINGERS IV SOLN
INTRAVENOUS | Status: DC
  Administered 2017-05-26 (×2): via INTRAVENOUS

## 2017-05-26 MED ORDER — OXYTOCIN 10 UNIT/ML IJ SOLN
INTRAMUSCULAR | Status: AC
  Filled 2017-05-26: qty 4

## 2017-05-26 MED ORDER — GENTAMICIN SULFATE 40 MG/ML IJ SOLN: 5.0000 mg/kg | INTRAVENOUS | Status: DC

## 2017-05-26 MED ORDER — PHENYLEPHRINE 40 MCG/ML (10ML) SYRINGE FOR IV PUSH (FOR BLOOD PRESSURE SUPPORT)
PREFILLED_SYRINGE | INTRAVENOUS | Status: AC
  Filled 2017-05-26: qty 20

## 2017-05-26 MED ORDER — GENTAMICIN SULFATE 40 MG/ML IJ SOLN
Freq: Once | INTRAVENOUS | Status: AC
  Administered 2017-05-26: 100 mL via INTRAVENOUS
  Filled 2017-05-26: qty 13

## 2017-05-26 MED ORDER — SCOPOLAMINE 1 MG/3DAYS TD PT72
MEDICATED_PATCH | TRANSDERMAL | Status: AC
  Filled 2017-05-26: qty 1

## 2017-05-26 MED ORDER — DEXTROSE 5 % IV SOLN
3.0000 g | Freq: Once | INTRAVENOUS | Status: DC
  Filled 2017-05-26 (×2): qty 3000

## 2017-05-26 MED ORDER — MORPHINE SULFATE (PF) 0.5 MG/ML IJ SOLN
INTRAMUSCULAR | Status: AC
  Filled 2017-05-26: qty 10

## 2017-05-26 MED ORDER — PHENYLEPHRINE HCL 10 MG/ML IJ SOLN
INTRAMUSCULAR | Status: DC | PRN
  Administered 2017-05-26 (×2): 80 ug via INTRAVENOUS

## 2017-05-26 MED ORDER — CLINDAMYCIN PHOSPHATE 900 MG/50ML IV SOLN: 900.0000 mg | INTRAVENOUS | Status: DC

## 2017-05-26 MED ORDER — PHENYLEPHRINE 8 MG IN D5W 100 ML (0.08MG/ML) PREMIX OPTIME
INJECTION | INTRAVENOUS | Status: DC | PRN
  Administered 2017-05-26: 60 ug/min via INTRAVENOUS

## 2017-05-26 MED ORDER — COCONUT OIL OIL
1.0000 "application " | TOPICAL_OIL | Status: DC | PRN
  Filled 2017-05-26: qty 120

## 2017-05-26 MED ORDER — SIMETHICONE 80 MG PO CHEW
80.0000 mg | CHEWABLE_TABLET | ORAL | Status: DC
  Administered 2017-05-26 – 2017-05-28 (×3): 80 mg via ORAL
  Filled 2017-05-26 (×5): qty 1

## 2017-05-26 MED ORDER — BUPIVACAINE HCL (PF) 0.25 % IJ SOLN
INTRAMUSCULAR | Status: AC
  Filled 2017-05-26: qty 30

## 2017-05-26 MED ORDER — SCOPOLAMINE 1 MG/3DAYS TD PT72
MEDICATED_PATCH | TRANSDERMAL | Status: DC | PRN
  Administered 2017-05-26: 1 via TRANSDERMAL

## 2017-05-26 MED ORDER — SENNOSIDES-DOCUSATE SODIUM 8.6-50 MG PO TABS
2.0000 | ORAL_TABLET | ORAL | Status: DC
  Administered 2017-05-26 – 2017-05-28 (×3): 2 via ORAL
  Filled 2017-05-26 (×5): qty 2

## 2017-05-26 MED ORDER — TETANUS-DIPHTH-ACELL PERTUSSIS 5-2.5-18.5 LF-MCG/0.5 IM SUSP
0.5000 mL | Freq: Once | INTRAMUSCULAR | Status: DC
  Filled 2017-05-26: qty 0.5

## 2017-05-26 MED ORDER — BUPIVACAINE IN DEXTROSE 0.75-8.25 % IT SOLN
INTRATHECAL | Status: AC
  Filled 2017-05-26: qty 2

## 2017-05-26 MED ORDER — MORPHINE SULFATE (PF) 0.5 MG/ML IJ SOLN
INTRAMUSCULAR | Status: DC | PRN
  Administered 2017-05-26: 3 mg via EPIDURAL

## 2017-05-26 MED ORDER — FENTANYL CITRATE (PF) 100 MCG/2ML IJ SOLN
INTRAMUSCULAR | Status: AC
  Filled 2017-05-26: qty 2

## 2017-05-26 MED ORDER — NALOXONE HCL 0.4 MG/ML IJ SOLN
0.4000 mg | INTRAMUSCULAR | Status: DC | PRN
  Administered 2017-05-26: 0.04 mg via INTRAVENOUS
  Filled 2017-05-26: qty 1

## 2017-05-26 MED ORDER — FENTANYL CITRATE (PF) 100 MCG/2ML IJ SOLN
25.0000 ug | INTRAMUSCULAR | Status: DC | PRN
  Administered 2017-05-26 (×4): 50 ug via INTRAVENOUS

## 2017-05-26 MED ORDER — LACTATED RINGERS IV SOLN
INTRAVENOUS | Status: DC | PRN
  Administered 2017-05-26: 40 [IU] via INTRAVENOUS

## 2017-05-26 MED ORDER — NALBUPHINE HCL 10 MG/ML IJ SOLN
INTRAMUSCULAR | Status: AC
  Administered 2017-05-26: 5 mg via SUBCUTANEOUS
  Filled 2017-05-26: qty 1

## 2017-05-26 MED ORDER — OXYCODONE-ACETAMINOPHEN 5-325 MG PO TABS
1.0000 | ORAL_TABLET | ORAL | Status: DC | PRN
  Administered 2017-05-26 – 2017-05-27 (×2): 1 via ORAL
  Filled 2017-05-26: qty 1

## 2017-05-26 MED ORDER — DIPHENHYDRAMINE HCL 25 MG PO CAPS
25.0000 mg | ORAL_CAPSULE | Freq: Four times a day (QID) | ORAL | Status: DC | PRN
  Administered 2017-05-26 – 2017-05-27 (×3): 25 mg via ORAL
  Filled 2017-05-26 (×4): qty 1

## 2017-05-26 MED ORDER — SIMETHICONE 80 MG PO CHEW
80.0000 mg | CHEWABLE_TABLET | Freq: Three times a day (TID) | ORAL | Status: DC
  Administered 2017-05-26 – 2017-05-29 (×9): 80 mg via ORAL
  Filled 2017-05-26 (×14): qty 1

## 2017-05-26 MED ORDER — ONDANSETRON HCL 4 MG/2ML IJ SOLN
INTRAMUSCULAR | Status: AC
  Filled 2017-05-26: qty 2

## 2017-05-26 MED ORDER — CEFAZOLIN SODIUM-DEXTROSE 1-4 GM/50ML-% IV SOLN
1.0000 g | Freq: Once | INTRAVENOUS | Status: DC
  Filled 2017-05-26: qty 50

## 2017-05-26 MED ORDER — CEFAZOLIN (ANCEF) 1 G IV SOLR
3.0000 g | INTRAVENOUS | Status: DC
  Filled 2017-05-26: qty 3

## 2017-05-26 MED ORDER — ZOLPIDEM TARTRATE 5 MG PO TABS: 5.0000 mg | ORAL_TABLET | Freq: Every evening | ORAL | Status: DC | PRN

## 2017-05-26 MED ORDER — IBUPROFEN 600 MG PO TABS: 600.0000 mg | ORAL_TABLET | Freq: Four times a day (QID) | ORAL | Status: DC

## 2017-05-26 MED ORDER — LACTATED RINGERS IV SOLN
INTRAVENOUS | Status: DC | PRN
  Administered 2017-05-26 (×2): via INTRAVENOUS

## 2017-05-26 MED ORDER — ONDANSETRON HCL 4 MG/2ML IJ SOLN
INTRAMUSCULAR | Status: DC | PRN
  Administered 2017-05-26: 4 mg via INTRAVENOUS

## 2017-05-26 MED ORDER — LIDOCAINE HCL (PF) 1 % IJ SOLN
INTRAMUSCULAR | Status: AC
  Filled 2017-05-26: qty 5

## 2017-05-26 MED ORDER — SUCCINYLCHOLINE CHLORIDE 200 MG/10ML IV SOSY
PREFILLED_SYRINGE | INTRAVENOUS | Status: AC
  Filled 2017-05-26: qty 10

## 2017-05-26 MED ORDER — MENTHOL 3 MG MT LOZG
1.0000 | LOZENGE | OROMUCOSAL | Status: DC | PRN
  Filled 2017-05-26: qty 9

## 2017-05-26 MED ORDER — WITCH HAZEL-GLYCERIN EX PADS: 1.0000 "application " | MEDICATED_PAD | CUTANEOUS | Status: DC | PRN

## 2017-05-26 MED ORDER — ACETAMINOPHEN 325 MG PO TABS
650.0000 mg | ORAL_TABLET | ORAL | Status: DC | PRN
Start: 2017-05-26 — End: 2017-05-29
  Administered 2017-05-26: 650 mg via ORAL
  Filled 2017-05-26: qty 2

## 2017-05-26 MED ORDER — SIMETHICONE 80 MG PO CHEW
80.0000 mg | CHEWABLE_TABLET | ORAL | Status: DC | PRN
  Filled 2017-05-26: qty 1

## 2017-05-26 MED ORDER — OXYCODONE-ACETAMINOPHEN 5-325 MG PO TABS
2.0000 | ORAL_TABLET | ORAL | Status: DC | PRN
  Administered 2017-05-27 – 2017-05-28 (×3): 2 via ORAL
  Filled 2017-05-26 (×4): qty 2

## 2017-05-26 MED ORDER — DIBUCAINE 1 % RE OINT: 1.0000 "application " | TOPICAL_OINTMENT | RECTAL | Status: DC | PRN

## 2017-05-26 SURGICAL SUPPLY — 39 items
BENZOIN TINCTURE PRP APPL 2/3 (GAUZE/BANDAGES/DRESSINGS) ×3 IMPLANT
CELLS DAT CNTRL 66122 CELL SVR (MISCELLANEOUS) ×1 IMPLANT
CHLORAPREP W/TINT 26ML (MISCELLANEOUS) ×3 IMPLANT
CLAMP CORD UMBIL (MISCELLANEOUS) IMPLANT
CLOSURE WOUND 1/2 X4 (GAUZE/BANDAGES/DRESSINGS) ×1
CLOTH BEACON ORANGE TIMEOUT ST (SAFETY) ×3 IMPLANT
DRSG OPSITE POSTOP 4X10 (GAUZE/BANDAGES/DRESSINGS) ×3 IMPLANT
ELECT REM PT RETURN 9FT ADLT (ELECTROSURGICAL) ×3
ELECTRODE REM PT RTRN 9FT ADLT (ELECTROSURGICAL) ×1 IMPLANT
EXTRACTOR VACUUM BELL STYLE (SUCTIONS) IMPLANT
EXTRACTOR VACUUM KIWI (MISCELLANEOUS) IMPLANT
GLOVE BIO SURGEON STRL SZ 6.5 (GLOVE) ×2 IMPLANT
GLOVE BIO SURGEONS STRL SZ 6.5 (GLOVE) ×1
GLOVE BIOGEL PI IND STRL 7.0 (GLOVE) ×2 IMPLANT
GLOVE BIOGEL PI INDICATOR 7.0 (GLOVE) ×4
GOWN STRL REUS W/TWL LRG LVL3 (GOWN DISPOSABLE) ×9 IMPLANT
KIT ABG SYR 3ML LUER SLIP (SYRINGE) IMPLANT
NEEDLE HYPO 22GX1.5 SAFETY (NEEDLE) IMPLANT
NEEDLE HYPO 25X5/8 SAFETYGLIDE (NEEDLE) IMPLANT
NS IRRIG 1000ML POUR BTL (IV SOLUTION) ×3 IMPLANT
PACK C SECTION WH (CUSTOM PROCEDURE TRAY) ×3 IMPLANT
PAD OB MATERNITY 4.3X12.25 (PERSONAL CARE ITEMS) ×3 IMPLANT
PENCIL SMOKE EVAC W/HOLSTER (ELECTROSURGICAL) ×3 IMPLANT
RTRCTR C-SECT PINK 25CM LRG (MISCELLANEOUS) ×3 IMPLANT
RTRCTR WOUND ALEXIS 18CM MED (MISCELLANEOUS) ×3
STRIP CLOSURE SKIN 1/2X4 (GAUZE/BANDAGES/DRESSINGS) ×2 IMPLANT
SUT CHROMIC 2 0 CT 1 (SUTURE) ×6 IMPLANT
SUT MNCRL 0 VIOLET CTX 36 (SUTURE) ×2 IMPLANT
SUT MONOCRYL 0 CTX 36 (SUTURE) ×4
SUT PDS AB 0 CTX 36 PDP370T (SUTURE) IMPLANT
SUT PLAIN 2 0 (SUTURE)
SUT PLAIN 2 0 XLH (SUTURE) ×3 IMPLANT
SUT PLAIN ABS 2-0 CT1 27XMFL (SUTURE) IMPLANT
SUT VIC AB 0 CTX 36 (SUTURE) ×4
SUT VIC AB 0 CTX36XBRD ANBCTRL (SUTURE) ×2 IMPLANT
SUT VIC AB 4-0 KS 27 (SUTURE) ×3 IMPLANT
SYR CONTROL 10ML LL (SYRINGE) IMPLANT
TOWEL OR 17X24 6PK STRL BLUE (TOWEL DISPOSABLE) ×3 IMPLANT
TRAY FOLEY BAG SILVER LF 14FR (SET/KITS/TRAYS/PACK) ×3 IMPLANT

## 2017-05-26 NOTE — Brief Op Note (Signed)
05/25/2017 - 05/26/2017  11:20 AM  PATIENT:  Beth [name]  31 y.o. female  PRE-OPERATIVE DIAGNOSIS:  Primary Cesarean Section for LGA, preeclampsia  POST-OPERATIVE DIAGNOSIS:  Primary Cesarean Section for LGA, preeclampsia  PROCEDURE:  Procedure(s): CESAREAN SECTION (N/A)  SURGEON:  Surgeon(s) and Role:    * Sanjuana Kava, MD - Primary   ASSISTANTS: none   ANESTHESIA:   spinal  EBL:  Total I/O In: 1800 [I.V.:1800] Out: 1756 [Urine:100; Blood:1656]  BLOOD ADMINISTERED:none  DRAINS: Urinary Catheter (Foley)   LOCAL MEDICATIONS USED:  MARCAINE    and Amount: 30 ml  SPECIMEN:  No Specimen  DISPOSITION OF SPECIMEN:  N/A  COUNTS:  YES  TOURNIQUET:  * No tourniquets in log *  DICTATION: .Note written in EPIC  PLAN OF CARE: Admit to inpatient   PATIENT DISPOSITION:  PACU - hemodynamically stable.   Delay start of Pharmacological VTE agent (>24hrs) due to surgical blood loss or risk of bleeding: yes

## 2017-05-26 NOTE — Op Note (Signed)
Cesarean Section Procedure Note  Indications: 38 week 3 day pregnancy with failed induction of labor for preeclampsia,  suspected macrosomia and cephalopelvic disproportion  Pre-operative Diagnosis: Arrest of labor; suspected macrosomia; preeclampsia  Post-operative Diagnosis: same  Procedure:  Primary cesarean delivery                         Surgeon: Caffie Damme, MD  Assistants: none  Anesthesia: Spinal anesthesia  ASA Class: 2  Procedure Details   The patient was counseled about the risks, benefits, complications of the cesarean section. The patient concurred with the proposed plan, giving informed consent.   The patient was taken to Operating Room # 1, identified as Beth [name] and the procedure verified as C-Section Delivery. A Time Out was held and the above information confirmed.  After spinal anesthesia was found to adequate , the patient was placed in the dorsal supine position with a leftward tilt, draped and prepped in the usual sterile manner. A Pfannenstiel incision was made and carried down through the subcutaneous tissue to the fascia.  The fascia was incised in the midline and the fascial incision was extended laterally with Mayo scissors. The superior aspect of the fascial incision was grasped with Coker clamps x2, tented up and the rectus muscles dissected off sharply with the bovie.  The rectus was then dissected off with blunt dissection and Mayo scissors inferiorly. The rectus muscles were separated in the midline. The abdominal peritoneum was identified, tented up, entered sharply using Metzenbaum scissors, and the incision was extended superiorly and inferiorly with good visualization of the bladder. The Alexis retractor was then deployed. The vesicouterine peritoneum was identified, tented up, entered sharply with Metzenbaum scissors, and the bladder flap was created digitally. Scalpel was then used to make a low transverse incision on the uterus which was  extended laterally with  blunt dissection. The fetal vertex was brought out through the incision, a loose nuchal cord reduced.  There was a compound hand, so the arm was delivered first and with the assisting surgical technician giving fundal pressure the rest of the body was delivered.  A live healthy female infant was bulb suctioned on the operative field cried vigorously, cord was clamped and cut and the infant was passed to the waiting neonatologist. Apgars 9/9. Placenta was then delivered spontaneously, intact and appeared normal.  There were several open sinuses bleeding so ring forceps were placed around the uterine incision. The uterus was cleared of all clot and debris. There was notable uterine  Atony and Anesthesia was made aware to add more pitocin to that IV of the patient. The uterine incision was repaired with #0 Monocryl in running locked fashion. A second imbricating suture was performed using the same suture. The incision was noted to be hemostatic. Ovaries and tubes were inspected and normal.  The Alexis retractor was removed. The abdominal cavity was cleared of all clot and debris. The abdominal peritoneum was reapproximated with 2-0 chromic  in a running fashion, the rectus muscles was reapproximated with #2 chromic in interrupted fashion. The fascia was closed with 0 Vicryl in a running fashion from either end meeting in the middle.  The subcuticular layer was irrigated and all bleeders cauterized.  20 mL of 0.25% Marcaine was injected into the subcutaneous layer.  The Scarpas fascia was re-approximated with interrupted sutures of 2-0 plain.  The skin was closed with 4-0 vicryl in a subcuticular fashion using a Lanny Hurst needle. The incision  was dressed with benzoine, steri strips and pressure dressing. All sponge lap and needle counts were correct x3.   Patient tolerated the procedure well and recovered in stable condition following the procedure.  Instrument, sponge, and needle counts were  correct x 3.   Findings: Live female infant, Apgars 9/9, clear amniotic fluid, normal appearing placenta, normal uterus, bilateral tubes and ovaries  Estimated Blood Loss: 1660mL  IVF:  1800 mL LR         Drains: Foley catheter  Urine output: 100 mL clear         Specimens: Placenta to L&D         Implants: none         Complications:  PPH         Disposition: PACU - hemodynamically stable.   Trace Cederberg STACIA

## 2017-05-26 NOTE — Anesthesia Preprocedure Evaluation (Addendum)
Anesthesia Evaluation  Patient identified by MRN, date of birth, ID band Patient awake    Reviewed: Allergy & Precautions, H&P , Patient's Chart, lab work & pertinent test results  Airway Mallampati: II  TM Distance: >3 FB Neck ROM: full    Dental no notable dental hx.    Pulmonary    Pulmonary exam normal breath sounds clear to auscultation       Cardiovascular Exercise Tolerance: Good hypertension,  Rhythm:regular Rate:Normal     Neuro/Psych    GI/Hepatic   Endo/Other  Morbid obesity  Renal/GU      Musculoskeletal   Abdominal   Peds  Hematology   Anesthesia Other Findings   Reproductive/Obstetrics                            Anesthesia Physical Anesthesia Plan  ASA: III  Anesthesia Plan:    Post-op Pain Management:    Induction:   PONV Risk Score and Plan:   Airway Management Planned:   Additional Equipment:   Intra-op Plan:   Post-operative Plan:   Informed Consent: I have reviewed the patients History and Physical, chart, labs and discussed the procedure including the risks, benefits and alternatives for the proposed anesthesia with the patient or authorized representative who has indicated his/her understanding and acceptance.     Plan Discussed with:   Anesthesia Plan Comments:         Anesthesia Quick Evaluation

## 2017-05-26 NOTE — Anesthesia Postprocedure Evaluation (Signed)
Anesthesia Post Note  Patient: [name]  Procedure(s) Performed: Procedure(s) (LRB): CESAREAN SECTION (N/A)     Patient location during evaluation: PACU Anesthesia Type: Spinal Level of consciousness: awake Pain management: satisfactory to patient Vital Signs Assessment: post-procedure vital signs reviewed and stable Respiratory status: spontaneous breathing Cardiovascular status: blood pressure returned to baseline Postop Assessment: no headache and spinal receding Anesthetic complications: no    Last Vitals:  Vitals:   05/26/17 1323 05/26/17 1425  BP: (!) 146/81 (!) 147/83  Pulse: (!) 48 (!) 57  Resp: 15 16  Temp: 37.2 C 36.8 C    Last Pain:  Vitals:   05/26/17 1425  TempSrc: Oral  PainSc:    Pain Goal: Patients Stated Pain Goal: 3 (05/26/17 1330)               Leary Mcnulty EDWARD

## 2017-05-26 NOTE — Transfer of Care (Signed)
Immediate Anesthesia Transfer of Care Note  Patient: [name]  Procedure(s) Performed: Procedure(s): CESAREAN SECTION (N/A)  Patient Location: PACU  Anesthesia Type:Spinal and Epidural  Level of Consciousness: awake, alert  and oriented  Airway & Oxygen Therapy: Patient Spontanous Breathing  Post-op Assessment: Report given to RN and Post -op Vital signs reviewed and stable  Post vital signs: Reviewed and stable  Last Vitals:  Vitals:   05/26/17 0920 05/26/17 0930  BP:  (!) 151/99  Pulse:  75  Resp:  18  Temp: 36.7 C     Last Pain:  Vitals:   05/26/17 0920  TempSrc: Oral  PainSc:          Complications: No apparent anesthesia complications

## 2017-05-26 NOTE — Anesthesia Procedure Notes (Signed)
Spinal  Patient location during procedure: OR Staffing Anesthesiologist: Lyndle Herrlich Preanesthetic Checklist Completed: patient identified, site marked, surgical consent, pre-op evaluation, timeout performed, IV checked, risks and benefits discussed and monitors and equipment checked Spinal Block Patient position: sitting Prep: DuraPrep Patient monitoring: cardiac monitor, continuous pulse ox, blood pressure and heart rate Approach: midline Location: L3-4 Injection technique: catheter Needle Needle type: Tuohy and Sprotte  Needle gauge: 24 G Needle length: 12.7 cm Needle insertion depth: 9 cm Catheter type: closed end flexible Catheter size: 19 g Catheter at skin depth: 16 cm Assessment Sensory level: T3 Additional Notes Spinal Dosage in OR  .75% Bupivicaine ml       1.9 (-) asp catheter

## 2017-05-26 NOTE — Progress Notes (Signed)
Beth [name] is a 31 y.o. G2P1001 at [redacted]w[redacted]d by LMP admitted for induction of labor due to Pre-eclamptic toxemia of pregnancy..  Subjective: Patient with no contraction pain  Objective: BP (!) 152/86   Pulse 72   Temp 98 F (36.7 C) (Oral)   Resp 18   Ht 6' (1.829 m)   Wt (!) 149.2 kg (329 lb)   SpO2 99%   BMI 44.62 kg/m  No intake/output data recorded. No intake/output data recorded.  FHT:  FHR: 130 bpm, variability: moderate,  accelerations:  Present,  decelerations:  Absent UC:   none SVE:   Dilation: 4 Effacement (%): 60 Station: -2 Exam by:: Vonna Kotyk RN  Labs: Lab Results  Component Value Date   WBC 6.9 05/25/2017   HGB 10.7 (L) 05/25/2017   HCT 33.2 (L) 05/25/2017   MCV 77.9 (L) 05/25/2017   PLT 181 05/25/2017    Assessment / Plan: Arrest in active phase of labor Discussed with patient that despite 24 hours of a large amount of pitocin the baby has not descended  Into the pelvis and she has not progressed into active labor My recommendation is to proceed with primary c-section due to  Suspected macrosomia / CPD  WP  Othmar Ringer, [place] 05/26/2017, 9:11 AM

## 2017-05-27 LAB — CBC
HEMATOCRIT: 28 % — AB (ref 36.0–46.0)
HEMOGLOBIN: 9 g/dL — AB (ref 12.0–15.0)
MCH: 25.3 pg — ABNORMAL LOW (ref 26.0–34.0)
MCHC: 32.1 g/dL (ref 30.0–36.0)
MCV: 78.7 fL (ref 78.0–100.0)
Platelets: 153 10*3/uL (ref 150–400)
RBC: 3.56 MIL/uL — AB (ref 3.87–5.11)
RDW: 16.4 % — AB (ref 11.5–15.5)
WBC: 6.6 10*3/uL (ref 4.0–10.5)

## 2017-05-27 NOTE — Lactation Note (Signed)
This note was copied from a baby's chart. Lactation Consultation Note  Patient Name: Beth [name] [id number] Date: 05/27/2017 Reason for consult: Initial assessment  Baby at 29 hours of life. Upon entry, mom was changing a wet diaper. Mom concerned that baby is not getting enough from breast as he is crying a lot. Reviewed normal baby behavior, feeding frequency, positioning at the breast, manual expressions and spoon feeding. Parents are aware of lactation services and support group.   Feeding plan: BF on demand 8+/24hr, f/u with manual expression spoon feeding per volume guidelines.  Maternal Data Has patient been taught Hand Expression?: Yes (per mom) Does the patient have breastfeeding experience prior to this delivery?: Yes  Feeding Feeding Type: Breast Fed Length of feed: 15 min  LATCH Score Latch: Grasps breast easily, tongue down, lips flanged, rhythmical sucking.  Audible Swallowing: A few with stimulation  Type of Nipple: Everted at rest and after stimulation  Comfort (Breast/Nipple): Soft / non-tender  Hold (Positioning): Assistance needed to correctly position infant at breast and maintain latch.  LATCH Score: 8  Interventions Interventions: Adjust position;Assisted with latch;Breast feeding basics reviewed;Breast compression;Hand express;Position options  Lactation Tools Discussed/Used     Consult Status Consult Status: Follow-up Date: 05/28/17 Follow-up type: In-patient    [place] E Voncile Schwarz/ Josefine Class RN 05/27/2017, 4:12 PM

## 2017-05-27 NOTE — Progress Notes (Signed)
POD#1 Pt states lochia mild to mod. She adequate pain control VSSAF Hgb stable IMP/ Stable Plan Will shower, ambulate. D/C IVFs and epidural

## 2017-05-27 NOTE — Progress Notes (Signed)
RN call anesthesia OB and gave pt history and platelets.  Notified Dr. Ouida Sills stated it would be okay to pull the epidural.  Anesthesia said it was okay to pull, RN will call labor and delivery.

## 2017-05-28 MED ORDER — IBUPROFEN 800 MG PO TABS
800.0000 mg | ORAL_TABLET | Freq: Four times a day (QID) | ORAL | Status: DC
  Administered 2017-05-28 – 2017-05-29 (×5): 800 mg via ORAL
  Filled 2017-05-28 (×6): qty 1

## 2017-05-28 NOTE — Lactation Note (Signed)
This note was copied from a baby's chart. Lactation Consultation Note  Patient Name: Beth [name] [id number] Date: 05/28/2017  Mom states baby cluster fed yesterday but sleepy since circumcision.  Reassured this is normal.  Mom states she is also pumping to stimulate supply.  Instructed to feed with any cue and call for assist prn.   Maternal Data    Feeding Feeding Type: Breast Fed Length of feed: 15 min  LATCH Score                   Interventions    Lactation Tools Discussed/Used     Consult Status      Ave Filter 05/28/2017, 11:57 AM

## 2017-05-28 NOTE — Progress Notes (Addendum)
  Patient is eating, ambulating, voiding.  Pain control is good.  Vitals:   05/27/17 0600 05/27/17 1000 05/27/17 1819 05/28/17 0539  BP: (!) 149/95 (!) 145/87 129/81 126/81  Pulse: 63 (!) 58 72 77  Resp: 18 18 20 16   Temp: 98.6 F (37 C) 98.6 F (37 C)  98.5 F (36.9 C)  TempSrc: Oral Oral  Oral  SpO2:  99%    Weight:      Height:        lungs:   clear to auscultation cor:    RRR Abdomen:  soft, appropriate tenderness, incisions intact and without erythema or exudate ex:    no cords   Lab Results  Component Value Date   WBC 6.6 05/27/2017   HGB 9.0 (L) 05/27/2017   HCT 28.0 (L) 05/27/2017   MCV 78.7 05/27/2017   PLT 153 05/27/2017    --/--/O POS (07/30 1600)/RI  [name]/P    Post operative day 2.  Routine post op and postpartum care.  Expect d/c routine.  Iron.  Percocet for pain control.  Parents desires circumsision.  All risks, benefits and alternatives discussed with the mother. PT desires to stay an additional day for pain control. Beth [name]

## 2017-05-29 LAB — TYPE AND SCREEN
ABO/RH(D): O POS
Antibody Screen: NEGATIVE
Unit division: 0
Unit division: 0

## 2017-05-29 LAB — CBC WITH DIFFERENTIAL/PLATELET
BASOS PCT: 1 %
Basophils Absolute: 0 10*3/uL (ref 0.0–0.1)
EOS ABS: 0.3 10*3/uL (ref 0.0–0.7)
Eosinophils Relative: 5 %
HCT: 26.3 % — ABNORMAL LOW (ref 36.0–46.0)
HEMOGLOBIN: 8.6 g/dL — AB (ref 12.0–15.0)
Lymphocytes Relative: 43 %
Lymphs Abs: 2.2 10*3/uL (ref 0.7–4.0)
MCH: 25.6 pg — ABNORMAL LOW (ref 26.0–34.0)
MCHC: 32.7 g/dL (ref 30.0–36.0)
MCV: 78.3 fL (ref 78.0–100.0)
MONOS PCT: 5 %
Monocytes Absolute: 0.3 10*3/uL (ref 0.1–1.0)
NEUTROS PCT: 46 %
Neutro Abs: 2.3 10*3/uL (ref 1.7–7.7)
Platelets: 201 10*3/uL (ref 150–400)
RBC: 3.36 MIL/uL — ABNORMAL LOW (ref 3.87–5.11)
RDW: 16.6 % — ABNORMAL HIGH (ref 11.5–15.5)
WBC: 5.1 10*3/uL (ref 4.0–10.5)

## 2017-05-29 LAB — COMPREHENSIVE METABOLIC PANEL
ALBUMIN: 2.3 g/dL — AB (ref 3.5–5.0)
ALK PHOS: 106 U/L (ref 38–126)
ALT: 15 U/L (ref 14–54)
AST: 28 U/L (ref 15–41)
Anion gap: 4 — ABNORMAL LOW (ref 5–15)
BILIRUBIN TOTAL: 0.6 mg/dL (ref 0.3–1.2)
BUN: 6 mg/dL (ref 6–20)
CALCIUM: 9.2 mg/dL (ref 8.9–10.3)
CO2: 25 mmol/L (ref 22–32)
CREATININE: 0.62 mg/dL (ref 0.44–1.00)
Chloride: 108 mmol/L (ref 101–111)
GFR calc Af Amer: >60 mL/min (ref >60)
GFR calc non Af Amer: >60 mL/min (ref >60)
GLUCOSE: 78 mg/dL (ref 65–99)
Potassium: 3.6 mmol/L (ref 3.5–5.1)
Sodium: 137 mmol/L (ref 135–145)
TOTAL PROTEIN: 5 g/dL — AB (ref 6.5–8.1)

## 2017-05-29 LAB — BPAM RBC
BLOOD PRODUCT EXPIRATION DATE: 201808242359
BLOOD PRODUCT EXPIRATION DATE: 201808252359
UNIT TYPE AND RH: 5100
Unit Type and Rh: 5100

## 2017-05-29 MED ORDER — OXYCODONE-ACETAMINOPHEN 5-325 MG PO TABS: 1.0000 | ORAL_TABLET | ORAL | 0 refills | Status: DC | PRN

## 2017-05-29 MED ORDER — DOCUSATE SODIUM 100 MG PO CAPS: 100.0000 mg | ORAL_CAPSULE | Freq: Two times a day (BID) | ORAL | 0 refills | Status: DC

## 2017-05-29 MED ORDER — IBUPROFEN 600 MG PO TABS: 600.0000 mg | ORAL_TABLET | Freq: Four times a day (QID) | ORAL | 0 refills | Status: DC | PRN

## 2017-05-29 NOTE — Discharge Instructions (Signed)

## 2017-05-29 NOTE — Lactation Note (Signed)
This note was copied from a baby's chart. Lactation Consultation Note  Patient Name: Beth [name] [id number] Date: 05/29/2017 Reason for consult: Follow-up assessment    With this experienced breast gfeeding mom, and the term, LGA baby. Mom states breast feeding going well, baby with ggod output. Mom states she has some nipple soreness. Mom was placing her nipple in the baby's mouth. With pillow supports, I showed mom how to begin with cross cradle hold, and she was able to obtain a deeper and  more comfortable latch. Mom was advised to support her back, and bring the baby to her. Mom very receptive to teaching, and knows to call for questions/conerns. Lactation free groups and activities resource given to mom.    Maternal Data    Feeding    LATCH Score                   Interventions    Lactation Tools Discussed/Used     Consult Status Consult Status: Complete Follow-up type: Call as needed    Tonna Corner 05/29/2017, 10:15 AM

## 2017-05-29 NOTE — Discharge Summary (Signed)
Obstetric Discharge Summary Reason for Admission: induction of labor Prenatal Procedures: NST and ultrasound Intrapartum Procedures: cesarean: low cervical, transverse Postpartum Procedures: none Complications-Operative and Postpartum: none Hemoglobin  Date Value Ref Range Status  05/29/2017 8.6 (L) 12.0 - 15.0 g/dL Final   HCT  Date Value Ref Range Status  05/29/2017 26.3 (L) 36.0 - 46.0 % Final    Physical Exam:  General: alert, cooperative and appears stated age 31: appropriate Uterine Fundus: firm Incision: healing well DVT Evaluation: No evidence of DVT seen on physical exam.  Discharge Diagnoses: Term Pregnancy-delivered  Discharge Information: Date: 05/29/2017 Activity: pelvic rest Diet: routine Medications: Ibuprofen, Colace and Percocet Condition: improved Instructions: refer to practice specific booklet Discharge to: home Follow-up Information    Sanjuana Kava, MD Follow up in 4 week(s).   Specialty:  Obstetrics and Gynecology Why:  For a postpartum evaluation Contact information: [address] [place] [address] [phone]           Newborn Data: Live born female  Birth Weight: 10 lb 1.7 oz (4585 g) APGAR: 9, 9  Home with mother.  Ski Polich H. 05/29/2017, 9:55 AM

## 2017-05-29 NOTE — Plan of Care (Signed)
Problem: Physical Regulation: Goal: Ability to maintain clinical measurements within normal limits will improve Outcome: Progressing Pt had abnormally high blood pressure during morning routine vitals. Ensured she had no other symptoms of preeclampsia. Contacted on-call Dr. Philis Pique, who stated at this time pt does not qualify for preeclamptic order set. CBC and CMP ordered, BP reassessed per Dr Philis Pique.

## 2017-05-29 NOTE — Progress Notes (Signed)
Patient ID: Beth [name], female   DOB: May 17, 1986, 31 y.o.   MRN: [id number]  Pt sleeping, will come back later

## 2017-06-03 ENCOUNTER — Inpatient Hospital Stay (HOSPITAL_COMMUNITY): Admission: RE | Admit: 2017-06-03 | Payer: Managed Care, Other (non HMO) | Source: Ambulatory Visit

## 2018-01-12 ENCOUNTER — Encounter (HOSPITAL_BASED_OUTPATIENT_CLINIC_OR_DEPARTMENT_OTHER): Payer: Self-pay

## 2018-01-12 ENCOUNTER — Other Ambulatory Visit: Payer: Self-pay

## 2018-01-12 ENCOUNTER — Emergency Department (HOSPITAL_BASED_OUTPATIENT_CLINIC_OR_DEPARTMENT_OTHER)
Admission: EM | Admit: 2018-01-12 | Discharge: 2018-01-12 | Disposition: A | Discharge status: Home / Self Care | Payer: Managed Care, Other (non HMO) | Attending: Emergency Medicine | Admitting: Emergency Medicine

## 2018-01-12 DIAGNOSIS — Z9089 Acquired absence of other organs: Secondary | ICD-10-CM | POA: Diagnosis not present

## 2018-01-12 DIAGNOSIS — G8918 Other acute postprocedural pain: Secondary | ICD-10-CM | POA: Insufficient documentation

## 2018-01-12 DIAGNOSIS — Z79899 Other long term (current) drug therapy: Secondary | ICD-10-CM | POA: Diagnosis not present

## 2018-01-12 DIAGNOSIS — R070 Pain in throat: Secondary | ICD-10-CM | POA: Diagnosis present

## 2018-01-12 MED ORDER — DEXAMETHASONE SODIUM PHOSPHATE 10 MG/ML IJ SOLN
10.0000 mg | Freq: Once | INTRAMUSCULAR | Status: AC
  Administered 2018-01-12: 10 mg via INTRAVENOUS
  Filled 2018-01-12: qty 1

## 2018-01-12 MED ORDER — SODIUM CHLORIDE 0.9 % IV BOLUS (SEPSIS)
1000.0000 mL | Freq: Once | INTRAVENOUS | Status: AC
  Administered 2018-01-12: 1000 mL via INTRAVENOUS

## 2018-01-12 MED ORDER — ONDANSETRON 4 MG PO TBDP: 4.0000 mg | ORAL_TABLET | Freq: Three times a day (TID) | ORAL | 0 refills | Status: AC | PRN

## 2018-01-12 MED ORDER — MORPHINE SULFATE (PF) 4 MG/ML IV SOLN
4.0000 mg | Freq: Once | INTRAVENOUS | Status: AC
  Administered 2018-01-12: 4 mg via INTRAVENOUS
  Filled 2018-01-12: qty 1

## 2018-01-12 MED ORDER — ONDANSETRON HCL 4 MG/2ML IJ SOLN
4.0000 mg | Freq: Once | INTRAMUSCULAR | Status: AC
  Administered 2018-01-12: 4 mg via INTRAVENOUS
  Filled 2018-01-12: qty 2

## 2018-01-12 MED ORDER — LIDOCAINE VISCOUS 2 % MT SOLN: 15.0000 mL | OROMUCOSAL | 0 refills | Status: DC | PRN

## 2018-01-12 MED FILL — ONDANSETRON ODT 4 MG TABLET: 4 | 3 days supply | Qty: 9 | Fill #0

## 2018-01-12 MED FILL — LIDOCAINE 2% VISCOUS SOLN: 2 | 7 days supply | Qty: 100 | Fill #0

## 2018-01-12 NOTE — ED Triage Notes (Signed)
Pt states having tonsillectomy last week and now c/o 7/10 throat burning pain, bilateral ear pain, difficulty speaking. Pt A+OX4, NAD

## 2018-01-12 NOTE — ED Provider Notes (Signed)
[facility] EMERGENCY DEPARTMENT Provider Note   CSN: [id number] Arrival date & time: 01/12/18  0609     History   Chief Complaint Chief Complaint  Patient presents with  . Throat Pain  . Otalgia    HPI Beth [name] is a 32 y.o. female.  The history is provided by the patient.  She has history of anemia and anxiety and comes in complaining of throat pain following tonsillectomy.  She had tonsillectomy done on March 12.  Since then, she has had severe throat pain with difficulty swallowing because of pain.  She has been taking hydrocodone with acetaminophen solution without relief of pain.  Her ENT physician advised her to double the dose, but this precipitated severe nausea.  She denies fever or chills or sweats.  She rates pain at 7/10.  Her physician did prescribe prednisolone syrup, but patient was unable to tolerate this.  Past Medical History:  Diagnosis Date  . Anemia   . Anxiety   . Bulging lumbar disc   . Headache    otc med prn - last one 4 months ago  . History of blood transfusion    after 04/02/2016 svd at [facility]  . SVD (spontaneous vaginal delivery)    x 1    Patient Active Problem List   Diagnosis Date Noted  . S/P cesarean section 05/26/2017  . Pre-eclampsia in third trimester 05/25/2017  . Active labor at term 04/02/2016  . Postpartum hemorrhage, delivered 04/02/2016    Past Surgical History:  Procedure Laterality Date  . CESAREAN SECTION N/A 05/26/2017   Procedure: CESAREAN SECTION;  Surgeon: Sanjuana Kava, MD;  Location: [facility];  Service: Obstetrics;  Laterality: N/A;  . DILATION AND CURETTAGE OF UTERUS N/A 06/12/2016   Procedure: DILATATION AND CURETTAGE;  Surgeon: Sanjuana Kava, MD;  Location: [facility];  Service: Gynecology;  Laterality: N/A;  . TONSILLECTOMY    . WISDOM TOOTH EXTRACTION      OB History    Gravida Para Term Preterm AB Living   2 2 2     2    SAB TAB Ectopic Multiple Live Births         0 2       Home  Medications    Prior to Admission medications   Medication Sig Start Date End Date Taking? Authorizing Provider  docusate sodium (COLACE) 100 MG capsule Take 1 capsule (100 mg total) by mouth 2 (two) times daily. 05/29/17   Vanessa Kick, MD  ibuprofen (ADVIL,MOTRIN) 600 MG tablet Take 1 tablet (600 mg total) by mouth every 6 (six) hours as needed. 05/29/17   Vanessa Kick, MD  Iron-FA-B Cmp-C-Biot-Probiotic (FUSION PLUS) CAPS Take 1 capsule by mouth daily.    [provider]  oxyCODONE-acetaminophen (ROXICET) 5-325 MG tablet Take 1-2 tablets by mouth every 4 (four) hours as needed for severe pain. 05/29/17   Vanessa Kick, MD  Prenatal MV-Min-FA-Omega-3 (PRENATAL GUMMIES/DHA & FA) 0.4-32.5 MG CHEW Chew 2 each by mouth daily.    [provider]    Family History Family History  Problem Relation Age of Onset  . Hypertension Other   . Cancer Other     Social History Social History   Tobacco Use  . Smoking status: Never Smoker  . Smokeless tobacco: Never Used  Substance Use Topics  . Alcohol use: No    Comment: occasional wine  . Drug use: No     Allergies   Food; Penicillins; and Strawberry (diagnostic)   Review  of Systems Review of Systems  All other systems reviewed and are negative.    Physical Exam Updated Vital Signs BP 139/88 (BP Location: Right Arm)   Pulse (!) 51   Temp 98.5 F (36.9 C) (Oral)   Resp 16   Ht 6' (1.829 m)   Wt 129.3 kg (285 lb)   SpO2 100%   BMI 38.65 kg/m   Physical Exam  Nursing note and vitals reviewed.  32 year old female, resting comfortably and in no acute distress. Vital signs are significant for mild bradycardia. Oxygen saturation is 100%, which is normal. Head is normocephalic and atraumatic. PERRLA, EOMI. Oropharynx shows fibrinous exudate in tonsillar fossae appropriate for 1 week post tonsillectomy.  There is no pooling of secretions and no stridor. Neck is nontender and supple without adenopathy or JVD. Back is  nontender and there is no CVA tenderness. Lungs are clear without rales, wheezes, or rhonchi. Chest is nontender. Heart has regular rate and rhythm without murmur. Abdomen is soft, flat, nontender without masses or hepatosplenomegaly and peristalsis is normoactive. Extremities have no cyanosis or edema, full range of motion is present. Skin is warm and dry without rash. Neurologic: Mental status is normal, cranial nerves are intact, there are no motor or sensory deficits.  ED Treatments / Results   Procedures Procedures (including critical care time)  Medications Ordered in ED Medications  morphine 4 MG/ML injection 4 mg (not administered)  sodium chloride 0.9 % bolus 1,000 mL (not administered)  ondansetron (ZOFRAN) injection 4 mg (not administered)     Initial Impression / Assessment and Plan / ED Course  I have reviewed the triage vital signs and the nursing notes.  Post tonsillectomy pain.  No evidence of infection on exam.  Specifically, no evidence of Ludwig's angina.  Old records are reviewed confirming tonsillectomy 1 week ago.  At this point, her main issue is pain control.  She will be given IV fluids, morphine, ondansetron and reassessed.  Following initial dose of morphine, pain was significantly improved.  She was able to speak much easier and with much less pain.  However, she still requesting additional pain relief and is given an additional dose of morphine.  Case is signed out to Dr. Dallas Breeding.  Final Clinical Impressions(s) / ED Diagnoses   Final diagnoses:  Post-tonsillectomy pain    ED Discharge Orders    None       Delora Fuel, MD 58/52/77 [phone]

## 2018-01-12 NOTE — ED Provider Notes (Signed)
I assumed care of this patient from Dr. Roxanne Mins at [id number].  Please see their note for further details of Hx, PE.  Briefly patient is a 32 y.o. female who presented with post tonsillectomy throat pain.  Exam was not concerning for postsurgical infection.  Patient was treated with IV pain medicine resulting in significant improvement.  Patient able to tolerate oral hydration.  Stable for discharge with close ENT follow-up.  The patient is safe for discharge with strict return precautions.  Disposition: Discharge  Condition: Good  I have discussed the results, Dx and Tx plan with the patient who expressed understanding and agree(s) with the plan. Discharge instructions discussed at great length. The patient was given strict return precautions who verbalized understanding of the instructions. No further questions at time of discharge.    ED Discharge Orders        Ordered    ondansetron (ZOFRAN ODT) 4 MG disintegrating tablet  Every 8 hours PRN     01/12/18 0746    lidocaine (XYLOCAINE) 2 % solution  As needed     01/12/18 0746       Follow Up: ENT  Call  As needed       Sibley Rolison, Grayce Sessions, MD 01/12/18 [phone]

## 2018-03-26 ENCOUNTER — Inpatient Hospital Stay (HOSPITAL_COMMUNITY)
Admission: AD | Admit: 2018-03-26 | Discharge: 2018-03-26 | Disposition: A | Discharge status: Home / Self Care | Payer: Managed Care, Other (non HMO) | Source: Ambulatory Visit | Attending: Obstetrics and Gynecology | Admitting: Obstetrics and Gynecology

## 2018-03-26 ENCOUNTER — Encounter (HOSPITAL_COMMUNITY): Payer: Self-pay

## 2018-03-26 ENCOUNTER — Other Ambulatory Visit: Payer: Self-pay

## 2018-03-26 DIAGNOSIS — Z79899 Other long term (current) drug therapy: Secondary | ICD-10-CM | POA: Diagnosis not present

## 2018-03-26 DIAGNOSIS — Z3202 Encounter for pregnancy test, result negative: Secondary | ICD-10-CM | POA: Diagnosis not present

## 2018-03-26 DIAGNOSIS — N939 Abnormal uterine and vaginal bleeding, unspecified: Secondary | ICD-10-CM | POA: Insufficient documentation

## 2018-03-26 DIAGNOSIS — Z880 Allergy status to penicillin: Secondary | ICD-10-CM | POA: Insufficient documentation

## 2018-03-26 LAB — URINALYSIS, ROUTINE W REFLEX MICROSCOPIC
BILIRUBIN URINE: NEGATIVE
Bacteria, UA: NONE SEEN
Glucose, UA: NEGATIVE mg/dL
Ketones, ur: NEGATIVE mg/dL
LEUKOCYTES UA: NEGATIVE
Nitrite: NEGATIVE
PH: 7 (ref 5.0–8.0)
Protein, ur: NEGATIVE mg/dL
SPECIFIC GRAVITY, URINE: 1.016 (ref 1.005–1.030)

## 2018-03-26 LAB — POCT PREGNANCY, URINE: Preg Test, Ur: NEGATIVE

## 2018-03-26 NOTE — MAU Provider Note (Signed)
History     CSN: [id number]  Arrival date and time: 03/26/18 1158   First Provider Initiated Contact with Patient 03/26/18 1307      Chief Complaint  Patient presents with  . Vaginal Bleeding   HPI Beth [name] is a 32 y.o. G12P2002 female who presents with vaginal bleeding. She has been taking OCPs since the birth of her son last summer. Last month, when she had her tonsils removed, she was unable to take the pills so missed 2 weeks of birth control. She has had irregular bleeding since then. Had her period on 5/1 which was 5 days late. Had multiple negative pregnancy tests at home. Started bleeding again this Monday. Today had a large gush of blood and heavy bleeding that lasted for an hour. Bleeding has since stopped. Reports lower abdominal cramping that she rates 3/10. Denies headache, dizziness, palpitations, CP, or SOB.  Normally sees Dr. Alwyn Pea. Reached out to Dr. Alwyn Pea when heavy bleeding started & was told to come to MAU.    Past Medical History:  Diagnosis Date  . Anemia   . Anxiety   . Bulging lumbar disc   . Headache    otc med prn - last one 4 months ago  . History of blood transfusion    after 04/02/2016 svd at [facility]  . SVD (spontaneous vaginal delivery)    x 1    Past Surgical History:  Procedure Laterality Date  . CESAREAN SECTION N/A 05/26/2017   Procedure: CESAREAN SECTION;  Surgeon: Sanjuana Kava, MD;  Location: [facility];  Service: Obstetrics;  Laterality: N/A;  . DILATION AND CURETTAGE OF UTERUS N/A 06/12/2016   Procedure: DILATATION AND CURETTAGE;  Surgeon: Sanjuana Kava, MD;  Location: [facility];  Service: Gynecology;  Laterality: N/A;  . TONSILLECTOMY    . WISDOM TOOTH EXTRACTION      Family History  Problem Relation Age of Onset  . Hypertension Other   . Cancer Other     Social History   Tobacco Use  . Smoking status: Never Smoker  . Smokeless tobacco: Never Used  Substance Use Topics  . Alcohol use: No    Comment: occasional wine  . Drug use:  No    Allergies:  Allergies  Allergen Reactions  . Food Anaphylaxis and Other (See Comments)    Pt is allergic to kiwi.   Marland Kitchen Penicillins Rash and Other (See Comments)    Has patient had a PCN reaction causing immediate rash, facial/tongue/throat swelling, SOB or lightheadedness with hypotension: No Has patient had a PCN reaction causing severe rash involving mucus membranes or skin necrosis: No Has patient had a PCN reaction that required hospitalization: No Has patient had a PCN reaction occurring within the last 10 years: Yes If all of the above answers are "NO", then may proceed with Cephalosporin use.   [place] (Diagnostic) Itching    Medications Prior to Admission  Medication Sig Dispense Refill Last Dose  . docusate sodium (COLACE) 100 MG capsule Take 1 capsule (100 mg total) by mouth 2 (two) times daily. 60 capsule 0   . ibuprofen (ADVIL,MOTRIN) 600 MG tablet Take 1 tablet (600 mg total) by mouth every 6 (six) hours as needed. 90 tablet 0   . Iron-FA-B Cmp-C-Biot-Probiotic (FUSION PLUS) CAPS Take 1 capsule by mouth daily.   05/25/2017 at Unknown time  . lidocaine (XYLOCAINE) 2 % solution Use as directed 15 mLs in the mouth or throat as needed for mouth pain. 100 mL 0   .  oxyCODONE-acetaminophen (ROXICET) 5-325 MG tablet Take 1-2 tablets by mouth every 4 (four) hours as needed for severe pain. 30 tablet 0   . Prenatal MV-Min-FA-Omega-3 (PRENATAL GUMMIES/DHA & FA) 0.4-32.5 MG CHEW Chew 2 each by mouth daily.   05/25/2017 at Unknown time    Review of Systems  Constitutional: Negative.   Cardiovascular: Negative for palpitations.  Gastrointestinal: Positive for abdominal pain.  Genitourinary: Positive for menstrual problem and vaginal bleeding.  Neurological: Negative for dizziness and headaches.   Physical Exam   Blood pressure 119/80, pulse 76, temperature 98.5 F (36.9 C), temperature source Oral, resp. rate 16, weight (!) 304 lb 4 oz (138 kg), last menstrual period  03/22/2018, SpO2 100 %, unknown if currently breastfeeding.  Physical Exam  Nursing note and vitals reviewed. Constitutional: She is oriented to person, place, and time. She appears well-developed and well-nourished. No distress.  HENT:  Head: Normocephalic and atraumatic.  Eyes: Conjunctivae are normal. Right eye exhibits no discharge. Left eye exhibits no discharge. No scleral icterus.  Neck: Normal range of motion.  Cardiovascular: Normal rate, regular rhythm and normal heart sounds.  Respiratory: Effort normal. No respiratory distress.  Neurological: She is alert and oriented to person, place, and time.  Skin: Skin is warm and dry. She is not diaphoretic.  Psychiatric: She has a normal mood and affect. Her behavior is normal. Judgment and thought content normal.    MAU Course  Procedures Results for orders placed or performed during the hospital encounter of 03/26/18 (from the past 24 hour(s))  Urinalysis, Routine w reflex microscopic     Status: Abnormal   Collection Time: 03/26/18 12:25 PM  Result Value Ref Range   Color, Urine YELLOW YELLOW   APPearance CLEAR CLEAR   Specific Gravity, Urine 1.016 1.005 - 1.030   pH 7.0 5.0 - 8.0   Glucose, UA NEGATIVE NEGATIVE mg/dL   Hgb urine dipstick LARGE (A) NEGATIVE   Bilirubin Urine NEGATIVE NEGATIVE   Ketones, ur NEGATIVE NEGATIVE mg/dL   Protein, ur NEGATIVE NEGATIVE mg/dL   Nitrite NEGATIVE NEGATIVE   Leukocytes, UA NEGATIVE NEGATIVE   RBC / HPF 21-50 0 - 5 RBC/hpf   WBC, UA 0-5 0 - 5 WBC/hpf   Bacteria, UA NONE SEEN NONE SEEN   Squamous Epithelial / LPF 0-5 0 - 5   Mucus PRESENT   Pregnancy, urine POC     Status: None   Collection Time: 03/26/18 12:41 PM  Result Value Ref Range   Preg Test, Ur NEGATIVE NEGATIVE    MDM Negative UPT Vital signs reassuring and pt asymptomatic.  Smear of pink/red blood on pad, 2x4 cm; pad on for about an hour.  Reviewed with Dr. Cletis Media.  Irregular bleeding likely d/t to missing 2 weeks  of OCPs. Pt stable for discharge.   Assessment and Plan  A: 1. Abnormal uterine bleeding (AUB)   2. Pregnancy examination or test, negative result    P: Discharge home Continue OCPs as prescribed Discussed reasons to return to MAU F/u with Dr. Alwyn Pea if bleeding continues or worsens  Jorje Guild 03/26/2018, 1:07 PM

## 2018-03-26 NOTE — Discharge Instructions (Signed)

## 2018-03-26 NOTE — MAU Note (Addendum)
Had a pregnancy scare the beginning of May.  Period was 5 days late (5 invalid tests), neg test 5/11, then got period 5/14. Started bleeding again on Monday, has been like a regular period.  About hour and half again, felt something dislodge then a rush of blood.  Soaked pad and through clothes.  Went into bathroom and there was like a fist sized glob of tissue in her underwear, continuing to bleed.

## 2018-03-26 NOTE — Progress Notes (Addendum)
G2P2 non-pregn. Here for bleeding after had regular period. Is on birth control "mini pill". Had a huge gush of bleeding with tissue. HPT done on May 11 and was negative  UPT negative today.   1300: MD Rivard notified. Report given. Ordered to have MAU provider see pt and discharge pt home.

## 2020-03-27 ENCOUNTER — Other Ambulatory Visit: Payer: Self-pay | Admitting: Obstetrics & Gynecology

## 2020-03-27 DIAGNOSIS — Z8742 Personal history of other diseases of the female genital tract: Secondary | ICD-10-CM

## 2020-04-26 ENCOUNTER — Ambulatory Visit
Admission: RE | Admit: 2020-04-26 | Discharge: 2020-04-26 | Disposition: A | Discharge status: Home / Self Care | Payer: BLUE CROSS/BLUE SHIELD | Source: Ambulatory Visit | Attending: Obstetrics & Gynecology | Admitting: Obstetrics & Gynecology

## 2020-04-26 DIAGNOSIS — Z8742 Personal history of other diseases of the female genital tract: Secondary | ICD-10-CM

## 2020-10-27 NOTE — L&D Delivery Note (Addendum)
Delivery Note She eventually progressed into active labor which was protracted.  At around 0300 she had been 8-9 cm for 4+ hrs, I recommended c-section.  Pt elected to try benadryl for edematous cervix and repositioning.  She progressed to complete and then pushed very well.  At 5:01 AM a viable female was delivered via Vaginal, Spontaneous (Presentation:   Occiput Anterior).  APGAR: 6, 9; weight pending.   Placenta status: Spontaneous, Intact.  Cord: 3 vessels with the following complications: None.  Dr. Elonda Husky attended the delivery, I arrived very shortly after delivery and delivered placenta and did perineal repair.  She had a large gush of blood after placenta and continued to have clots in LUS.  She was given TXA and 875mg rectal cytotec, and with uterine massage and IV pitocin bleeding improved  Anesthesia: Epidural Episiotomy: None Lacerations: 1st degree;Periurethral Suture Repair: 3.0 vicryl Est. Blood Loss (mL): 500  Mom to postpartum.  Baby to Couplet care / Skin to Skin.  Will continue magnesium, procardia and prn labetalol.  She did require at least one more dose of IV labetalol during labor for HTN  TBlane OharaMeisinger 05/18/2021, 5:47 AM

## 2020-11-07 LAB — OB RESULTS CONSOLE RPR: RPR: NONREACTIVE

## 2020-11-07 LAB — OB RESULTS CONSOLE GC/CHLAMYDIA
Chlamydia: NEGATIVE
Gonorrhea: NEGATIVE

## 2020-11-07 LAB — OB RESULTS CONSOLE ABO/RH: RH Type: POSITIVE

## 2020-11-07 LAB — OB RESULTS CONSOLE ANTIBODY SCREEN: Antibody Screen: NEGATIVE

## 2020-11-07 LAB — OB RESULTS CONSOLE HEPATITIS B SURFACE ANTIGEN: Hepatitis B Surface Ag: NEGATIVE

## 2020-11-07 LAB — OB RESULTS CONSOLE RUBELLA ANTIBODY, IGM: Rubella: IMMUNE

## 2021-02-27 LAB — OB RESULTS CONSOLE HIV ANTIBODY (ROUTINE TESTING): HIV: NONREACTIVE

## 2021-05-02 LAB — OB RESULTS CONSOLE GBS: GBS: POSITIVE

## 2021-05-16 ENCOUNTER — Inpatient Hospital Stay (HOSPITAL_COMMUNITY)
Admission: AD | Admit: 2021-05-16 | Discharge: 2021-05-20 | DRG: 807 | Disposition: A | Discharge status: Home / Self Care | Payer: BLUE CROSS/BLUE SHIELD | Attending: Obstetrics and Gynecology | Admitting: Obstetrics and Gynecology

## 2021-05-16 ENCOUNTER — Encounter (HOSPITAL_COMMUNITY): Payer: Self-pay | Admitting: Obstetrics and Gynecology

## 2021-05-16 ENCOUNTER — Inpatient Hospital Stay (HOSPITAL_COMMUNITY): Payer: BLUE CROSS/BLUE SHIELD

## 2021-05-16 ENCOUNTER — Other Ambulatory Visit: Payer: Self-pay

## 2021-05-16 DIAGNOSIS — O3663X0 Maternal care for excessive fetal growth, third trimester, not applicable or unspecified: Secondary | ICD-10-CM | POA: Diagnosis present

## 2021-05-16 DIAGNOSIS — Z3A38 38 weeks gestation of pregnancy: Secondary | ICD-10-CM | POA: Diagnosis not present

## 2021-05-16 DIAGNOSIS — Z880 Allergy status to penicillin: Secondary | ICD-10-CM

## 2021-05-16 DIAGNOSIS — Z20822 Contact with and (suspected) exposure to covid-19: Secondary | ICD-10-CM | POA: Diagnosis present

## 2021-05-16 DIAGNOSIS — O1414 Severe pre-eclampsia complicating childbirth: Secondary | ICD-10-CM | POA: Diagnosis present

## 2021-05-16 DIAGNOSIS — O134 Gestational [pregnancy-induced] hypertension without significant proteinuria, complicating childbirth: Secondary | ICD-10-CM | POA: Diagnosis present

## 2021-05-16 DIAGNOSIS — O34219 Maternal care for unspecified type scar from previous cesarean delivery: Secondary | ICD-10-CM | POA: Diagnosis present

## 2021-05-16 DIAGNOSIS — O99824 Streptococcus B carrier state complicating childbirth: Secondary | ICD-10-CM | POA: Diagnosis present

## 2021-05-16 DIAGNOSIS — O139 Gestational [pregnancy-induced] hypertension without significant proteinuria, unspecified trimester: Secondary | ICD-10-CM | POA: Diagnosis present

## 2021-05-16 LAB — CBC
HCT: 37.4 % (ref 36.0–46.0)
Hemoglobin: 12.2 g/dL (ref 12.0–15.0)
MCH: 27.9 pg (ref 26.0–34.0)
MCHC: 32.6 g/dL (ref 30.0–36.0)
MCV: 85.4 fL (ref 80.0–100.0)
Platelets: 230 10*3/uL (ref 150–400)
RBC: 4.38 MIL/uL (ref 3.87–5.11)
RDW: 14.1 % (ref 11.5–15.5)
WBC: 7.2 10*3/uL (ref 4.0–10.5)
nRBC: 0 % (ref 0.0–0.2)

## 2021-05-16 LAB — TYPE AND SCREEN
ABO/RH(D): O POS
Antibody Screen: NEGATIVE

## 2021-05-16 LAB — COMPREHENSIVE METABOLIC PANEL
ALT: 11 U/L (ref 0–44)
AST: 17 U/L (ref 15–41)
Albumin: 2.8 g/dL — ABNORMAL LOW (ref 3.5–5.0)
Alkaline Phosphatase: 122 U/L (ref 38–126)
Anion gap: 8 (ref 5–15)
BUN: 5 mg/dL — ABNORMAL LOW (ref 6–20)
CO2: 22 mmol/L (ref 22–32)
Calcium: 10.8 mg/dL — ABNORMAL HIGH (ref 8.9–10.3)
Chloride: 107 mmol/L (ref 98–111)
Creatinine, Ser: 0.62 mg/dL (ref 0.44–1.00)
GFR, Estimated: >60 mL/min (ref >60)
Glucose, Bld: 82 mg/dL (ref 70–99)
Potassium: 3.5 mmol/L (ref 3.5–5.1)
Sodium: 137 mmol/L (ref 135–145)
Total Bilirubin: 0.8 mg/dL (ref 0.3–1.2)
Total Protein: 6 g/dL — ABNORMAL LOW (ref 6.5–8.1)

## 2021-05-16 LAB — SARS CORONAVIRUS 2 (TAT 6-24 HRS): SARS Coronavirus 2: NEGATIVE

## 2021-05-16 LAB — PROTEIN / CREATININE RATIO, URINE
Creatinine, Urine: 42.32 mg/dL
Total Protein, Urine: <6 mg/dL

## 2021-05-16 LAB — URIC ACID: Uric Acid, Serum: 5.6 mg/dL (ref 2.5–7.1)

## 2021-05-16 LAB — LACTATE DEHYDROGENASE: LDH: 126 U/L (ref 98–192)

## 2021-05-16 MED ORDER — ONDANSETRON HCL 4 MG/2ML IJ SOLN
4.0000 mg | Freq: Four times a day (QID) | INTRAMUSCULAR | Status: DC | PRN
  Administered 2021-05-17: 4 mg via INTRAVENOUS
  Filled 2021-05-16: qty 2

## 2021-05-16 MED ORDER — LABETALOL HCL 5 MG/ML IV SOLN
40.0000 mg | INTRAVENOUS | Status: DC | PRN
  Administered 2021-05-16: 40 mg via INTRAVENOUS
  Filled 2021-05-16: qty 8

## 2021-05-16 MED ORDER — OXYTOCIN-SODIUM CHLORIDE 30-0.9 UT/500ML-% IV SOLN
1.0000 m[IU]/min | INTRAVENOUS | Status: DC
  Administered 2021-05-17: 16 m[IU]/min via INTRAVENOUS
  Administered 2021-05-18: 26 m[IU]/min via INTRAVENOUS
  Filled 2021-05-16: qty 500

## 2021-05-16 MED ORDER — OXYTOCIN-SODIUM CHLORIDE 30-0.9 UT/500ML-% IV SOLN
2.5000 [IU]/h | INTRAVENOUS | Status: DC
  Administered 2021-05-18: 2.5 [IU]/h via INTRAVENOUS

## 2021-05-16 MED ORDER — TERBUTALINE SULFATE 1 MG/ML IJ SOLN: 0.2500 mg | Freq: Once | INTRAMUSCULAR | Status: DC | PRN

## 2021-05-16 MED ORDER — LIDOCAINE HCL (PF) 1 % IJ SOLN
30.0000 mL | INTRAMUSCULAR | Status: AC | PRN
  Administered 2021-05-18: 30 mL via SUBCUTANEOUS
  Filled 2021-05-16: qty 30

## 2021-05-16 MED ORDER — NIFEDIPINE ER OSMOTIC RELEASE 30 MG PO TB24
30.0000 mg | ORAL_TABLET | Freq: Every day | ORAL | Status: DC
  Administered 2021-05-16 – 2021-05-18 (×3): 30 mg via ORAL
  Filled 2021-05-16 (×3): qty 1

## 2021-05-16 MED ORDER — CLINDAMYCIN PHOSPHATE 900 MG/50ML IV SOLN
900.0000 mg | Freq: Three times a day (TID) | INTRAVENOUS | Status: DC
  Administered 2021-05-17 – 2021-05-18 (×4): 900 mg via INTRAVENOUS
  Filled 2021-05-16 (×4): qty 50

## 2021-05-16 MED ORDER — SOD CITRATE-CITRIC ACID 500-334 MG/5ML PO SOLN: 30.0000 mL | ORAL | Status: DC | PRN

## 2021-05-16 MED ORDER — ACETAMINOPHEN 500 MG PO TABS
1000.0000 mg | ORAL_TABLET | Freq: Four times a day (QID) | ORAL | Status: DC | PRN
  Administered 2021-05-16: 1000 mg via ORAL
  Filled 2021-05-16: qty 2

## 2021-05-16 MED ORDER — OXYTOCIN BOLUS FROM INFUSION
333.0000 mL | Freq: Once | INTRAVENOUS | Status: AC
  Administered 2021-05-18: 333 mL via INTRAVENOUS

## 2021-05-16 MED ORDER — OXYCODONE-ACETAMINOPHEN 5-325 MG PO TABS
1.0000 | ORAL_TABLET | ORAL | Status: DC | PRN
Start: 2021-05-16 — End: 2021-05-18

## 2021-05-16 MED ORDER — ACETAMINOPHEN 325 MG PO TABS
650.0000 mg | ORAL_TABLET | ORAL | Status: DC | PRN
  Administered 2021-05-16 – 2021-05-17 (×2): 650 mg via ORAL
  Filled 2021-05-16 (×3): qty 2

## 2021-05-16 MED ORDER — LABETALOL HCL 5 MG/ML IV SOLN: 80.0000 mg | INTRAVENOUS | Status: DC | PRN

## 2021-05-16 MED ORDER — MAGNESIUM SULFATE BOLUS VIA INFUSION
4.0000 g | Freq: Once | INTRAVENOUS | Status: AC
  Administered 2021-05-16: 4 g via INTRAVENOUS
  Filled 2021-05-16: qty 1000

## 2021-05-16 MED ORDER — CLINDAMYCIN PHOSPHATE 900 MG/50ML IV SOLN
900.0000 mg | Freq: Once | INTRAVENOUS | Status: AC
  Administered 2021-05-16: 900 mg via INTRAVENOUS
  Filled 2021-05-16: qty 50

## 2021-05-16 MED ORDER — OXYTOCIN-SODIUM CHLORIDE 30-0.9 UT/500ML-% IV SOLN: 1.0000 m[IU]/min | INTRAVENOUS | Status: DC

## 2021-05-16 MED ORDER — MAGNESIUM SULFATE 40 GM/1000ML IV SOLN
2.0000 g/h | INTRAVENOUS | Status: AC
  Administered 2021-05-16 – 2021-05-19 (×4): 2 g/h via INTRAVENOUS
  Filled 2021-05-16 (×4): qty 1000

## 2021-05-16 MED ORDER — HYDRALAZINE HCL 20 MG/ML IJ SOLN: 10.0000 mg | INTRAMUSCULAR | Status: DC | PRN

## 2021-05-16 MED ORDER — OXYCODONE-ACETAMINOPHEN 5-325 MG PO TABS: 2.0000 | ORAL_TABLET | ORAL | Status: DC | PRN

## 2021-05-16 MED ORDER — OXYTOCIN-SODIUM CHLORIDE 30-0.9 UT/500ML-% IV SOLN
1.0000 m[IU]/min | INTRAVENOUS | Status: DC
  Administered 2021-05-16: 1 m[IU]/min via INTRAVENOUS
  Filled 2021-05-16: qty 500

## 2021-05-16 MED ORDER — LACTATED RINGERS IV SOLN
INTRAVENOUS | Status: DC
  Administered 2021-05-17 (×2): 62 mL/h via INTRAVENOUS

## 2021-05-16 MED ORDER — BUTORPHANOL TARTRATE 1 MG/ML IJ SOLN
1.0000 mg | INTRAMUSCULAR | Status: DC | PRN
  Administered 2021-05-17 (×3): 1 mg via INTRAVENOUS
  Filled 2021-05-16 (×3): qty 1

## 2021-05-16 MED ORDER — LACTATED RINGERS IV SOLN
500.0000 mL | INTRAVENOUS | Status: DC | PRN
  Administered 2021-05-17: 1000 mL via INTRAVENOUS

## 2021-05-16 MED ORDER — LABETALOL HCL 5 MG/ML IV SOLN
20.0000 mg | INTRAVENOUS | Status: DC | PRN
  Administered 2021-05-16 (×2): 20 mg via INTRAVENOUS
  Filled 2021-05-16 (×2): qty 4

## 2021-05-16 NOTE — H&P (Signed)
Beth [name] is a 34 y.o. [phone] female presenting for IOL at 22 4/7wks due to [facility]. Pt has a history of gestational HTN but had controlled BPs ( no meds) till yesterday when elevated diastolic of 90 was noted. On arrival at the hospital however, BP was 180s/110s and she reported a mild HA. Pt is dated per 9 week [place]. Her baby was noted to be in the 91st%ile at 34 weeks ( proven pelvis to 8lbs). She also has a history of postpartum hemorrhage after 40hr labor. She had an svd with her first child and a c/s with her second. She has requested TOLAC and been counseled regarding risks/benefits.  She is GBS positive, PCN allergy, clindamycin sensitive. Covid neg  OB History     Gravida  3   Para  2   Term  2   Preterm      AB      Living  2      SAB      IAB      Ectopic      Multiple  0   Live Births  2          Past Medical History:  Diagnosis Date   Anemia    Anxiety    Bulging lumbar disc    Headache    otc med prn - last one 4 months ago   History of blood transfusion    after 04/02/2016 svd at [facility]   SVD (spontaneous vaginal delivery)    x 1   Past Surgical History:  Procedure Laterality Date   CESAREAN SECTION N/A 05/26/2017   Procedure: CESAREAN SECTION;  Surgeon: Sanjuana Kava, MD;  Location: [facility];  Service: Obstetrics;  Laterality: N/A;   DILATION AND CURETTAGE OF UTERUS N/A 06/12/2016   Procedure: DILATATION AND CURETTAGE;  Surgeon: Sanjuana Kava, MD;  Location: [facility];  Service: Gynecology;  Laterality: N/A;   TONSILLECTOMY     WISDOM TOOTH EXTRACTION     Family History: family history includes Cancer in an other family member; Hypertension in an other family member. Social History:  reports that she has never smoked. She has never used smokeless tobacco. She reports that she does not drink alcohol and does not use drugs.     Maternal Diabetes: No Genetic Screening: Normal Maternal Ultrasounds/Referrals: Normal Fetal Ultrasounds or other  Referrals:  None Maternal Substance Abuse:  No Significant Maternal Medications:  None Significant Maternal Lab Results:  Group B Strep positive Other Comments:  None  Review of Systems  Constitutional:  Negative for activity change and diaphoresis.  Eyes:  Negative for photophobia and visual disturbance.  Respiratory:  Negative for chest tightness and shortness of breath.   Cardiovascular:  Positive for leg swelling. Negative for chest pain and palpitations.  Gastrointestinal:  Negative for abdominal pain.  Musculoskeletal:  Negative for back pain and myalgias.  Neurological:  Positive for headaches. Negative for light-headedness.  Psychiatric/Behavioral:  The patient is not nervous/anxious.   All other systems reviewed and are negative. Maternal Medical History:  Reason for admission: PReE  Contractions: Frequency: rare.   Perceived severity is mild.   Fetal activity: Perceived fetal activity is normal.   Prenatal complications: Pre-eclampsia.   Prenatal Complications - Diabetes: none.  Dilation: 1 Effacement (%): Thick Station: -3 Exam by:: dr Terri Piedra Blood pressure (!) 147/93, pulse 78, temperature 98.2 F (36.8 C), temperature source Oral, resp. rate 18, height 5\' 11"  (1.803 m), weight (!) 176 kg, unknown if currently  breastfeeding. Maternal Exam:  Uterine Assessment: Contraction strength is mild.  Contraction frequency is rare.  Abdomen: Patient reports generalized tenderness.  Estimated fetal weight is AGA.   Fetal presentation: vertex Introitus: Normal vulva. Vulva is negative for condylomata and lesion.  Normal vagina.  Vagina is negative for condylomata.  Pelvis: adequate for delivery.   Cervix: Cervix evaluated by digital exam.     Fetal Exam Fetal Monitor Review: Baseline rate: 120.  Variability: moderate (6-25 bpm).   Pattern: accelerations present and no decelerations.   Fetal State Assessment: Category I - tracings are normal.  Physical Exam Vitals and  nursing note reviewed. Exam conducted with a chaperone present.  Constitutional:      Appearance: Normal appearance.  Cardiovascular:     Pulses: Normal pulses.  Pulmonary:     Effort: Pulmonary effort is normal.  Abdominal:     Tenderness: There is generalized abdominal tenderness.  Genitourinary:    General: Normal vulva.  Vulva is no lesion.  Musculoskeletal:        General: Normal range of motion.     Cervical back: Normal range of motion.  Skin:    General: Skin is warm and dry.     Capillary Refill: Capillary refill takes 2 to 3 seconds.  Neurological:     General: No focal deficit present.     Mental Status: She is alert and oriented to person, place, and time. Mental status is at baseline.  Psychiatric:        Mood and Affect: Mood normal.        Behavior: Behavior normal.        Thought Content: Thought content normal.        Judgment: Judgment normal.    Prenatal labs: ABO, Rh: --/--/O POS (07/21 1445) Antibody: NEG (07/21 1445) Rubella:   RPR:    HBsAg:    HIV:    GBS:     Assessment/Plan: 34yo [id number] at 69 4/7wks with PreE, LGA for TOLAC ( proven pelvis to 8lbs) - Admit - Nl plts, LFTs; pending upr/cr ratio, uric acid and LDH - Started on MgSO4 with a 4gm bolus and running at 2gm/hr - Labetalol protocol ordered - Pitocin per protocol till 26mus then stop; foley bulb/AROM when able - Pain control prn  - Indications for repeat c/s reviewed  - Discussed signs/symptoms of preE/ Ecc      Beth [name] 05/16/2021, 5:26 PM

## 2021-05-17 ENCOUNTER — Inpatient Hospital Stay (HOSPITAL_COMMUNITY): Payer: BLUE CROSS/BLUE SHIELD | Admitting: Anesthesiology

## 2021-05-17 LAB — CBC
HCT: 35.5 % — ABNORMAL LOW (ref 36.0–46.0)
Hemoglobin: 11.9 g/dL — ABNORMAL LOW (ref 12.0–15.0)
MCH: 28.1 pg (ref 26.0–34.0)
MCHC: 33.5 g/dL (ref 30.0–36.0)
MCV: 83.7 fL (ref 80.0–100.0)
Platelets: 202 10*3/uL (ref 150–400)
RBC: 4.24 MIL/uL (ref 3.87–5.11)
RDW: 13.9 % (ref 11.5–15.5)
WBC: 7.1 10*3/uL (ref 4.0–10.5)
nRBC: 0 % (ref 0.0–0.2)

## 2021-05-17 LAB — RPR: RPR Ser Ql: NONREACTIVE

## 2021-05-17 MED ORDER — HYDRALAZINE HCL 20 MG/ML IJ SOLN: 10.0000 mg | INTRAMUSCULAR | Status: DC | PRN

## 2021-05-17 MED ORDER — PHENYLEPHRINE 40 MCG/ML (10ML) SYRINGE FOR IV PUSH (FOR BLOOD PRESSURE SUPPORT)
80.0000 ug | PREFILLED_SYRINGE | Freq: Once | INTRAVENOUS | Status: AC | PRN
  Administered 2021-05-17: 80 ug via INTRAVENOUS

## 2021-05-17 MED ORDER — LABETALOL HCL 5 MG/ML IV SOLN
20.0000 mg | INTRAVENOUS | Status: DC | PRN
  Administered 2021-05-18: 20 mg via INTRAVENOUS
  Filled 2021-05-17: qty 4

## 2021-05-17 MED ORDER — LABETALOL HCL 5 MG/ML IV SOLN: 40.0000 mg | INTRAVENOUS | Status: DC | PRN

## 2021-05-17 MED ORDER — EPHEDRINE 5 MG/ML INJ
10.0000 mg | INTRAVENOUS | Status: AC | PRN
  Administered 2021-05-17 (×2): 10 mg via INTRAVENOUS
  Filled 2021-05-17: qty 5

## 2021-05-17 MED ORDER — PHENYLEPHRINE 40 MCG/ML (10ML) SYRINGE FOR IV PUSH (FOR BLOOD PRESSURE SUPPORT)
80.0000 ug | PREFILLED_SYRINGE | INTRAVENOUS | Status: DC | PRN
  Filled 2021-05-17: qty 10

## 2021-05-17 MED ORDER — LIDOCAINE HCL (PF) 1 % IJ SOLN
INTRAMUSCULAR | Status: DC | PRN
  Administered 2021-05-17 (×2): 5 mL via EPIDURAL
  Administered 2021-05-17: 4 mL via EPIDURAL

## 2021-05-17 MED ORDER — EPHEDRINE 5 MG/ML INJ: 10.0000 mg | Freq: Once | INTRAVENOUS | Status: DC

## 2021-05-17 MED ORDER — FENTANYL-BUPIVACAINE-NACL 0.5-0.125-0.9 MG/250ML-% EP SOLN
12.0000 mL/h | EPIDURAL | Status: DC | PRN
  Administered 2021-05-17: 14 mL/h via EPIDURAL
  Administered 2021-05-18: 12 mL/h via EPIDURAL
  Filled 2021-05-17 (×2): qty 250

## 2021-05-17 MED ORDER — PHENYLEPHRINE 40 MCG/ML (10ML) SYRINGE FOR IV PUSH (FOR BLOOD PRESSURE SUPPORT)
40.0000 ug | PREFILLED_SYRINGE | Freq: Once | INTRAVENOUS | Status: AC | PRN
  Administered 2021-05-17: 40 ug via INTRAVENOUS

## 2021-05-17 MED ORDER — PHENYLEPHRINE 40 MCG/ML (10ML) SYRINGE FOR IV PUSH (FOR BLOOD PRESSURE SUPPORT)
80.0000 ug | PREFILLED_SYRINGE | INTRAVENOUS | Status: DC | PRN
Start: 2021-05-17 — End: 2021-05-18
  Administered 2021-05-17: 80 ug via INTRAVENOUS

## 2021-05-17 MED ORDER — LABETALOL HCL 5 MG/ML IV SOLN: 80.0000 mg | INTRAVENOUS | Status: DC | PRN

## 2021-05-17 MED ORDER — ACETAMINOPHEN 500 MG PO TABS
1000.0000 mg | ORAL_TABLET | Freq: Four times a day (QID) | ORAL | Status: DC | PRN
  Administered 2021-05-18: 1000 mg via ORAL
  Filled 2021-05-17: qty 2

## 2021-05-17 MED ORDER — LACTATED RINGERS IV SOLN
500.0000 mL | Freq: Once | INTRAVENOUS | Status: AC
  Administered 2021-05-17: 500 mL via INTRAVENOUS

## 2021-05-17 MED ORDER — DIPHENHYDRAMINE HCL 50 MG/ML IJ SOLN
12.5000 mg | INTRAMUSCULAR | Status: DC | PRN
  Administered 2021-05-17 – 2021-05-18 (×2): 12.5 mg via INTRAVENOUS
  Filled 2021-05-17: qty 1

## 2021-05-17 MED ORDER — BUTALBITAL-APAP-CAFFEINE 50-325-40 MG PO TABS
1.0000 | ORAL_TABLET | ORAL | Status: DC | PRN
  Administered 2021-05-17 (×2): 1 via ORAL
  Filled 2021-05-17 (×2): qty 1

## 2021-05-17 MED ORDER — EPHEDRINE 5 MG/ML INJ: 10.0000 mg | INTRAVENOUS | Status: DC | PRN

## 2021-05-17 NOTE — Progress Notes (Signed)
Cardio reapplied after unsuccessful epidural attempt

## 2021-05-17 NOTE — Progress Notes (Signed)
Comfortable with epidural Afeb, VSS, current BP 151/90 FHT-130s, Cat I now, had some variable and late decels when hypotensive after epidural VE-5/50/-2, vtx, IUPC replaced Will continue magnesium for preeclampsia, clinda for +GBS, will increase pitocin to get ctx q 3 min and monitor progress and FHT, not in active labor yet

## 2021-05-17 NOTE — Progress Notes (Signed)
   05/17/21 1140  Clinical Encounter Type  Visited With Patient and family together  Visit Type Initial  Referral From Nurse  Consult/Referral To Chaplain    Chaplain responded to the Advance Directive request and provided education. The patient's husband was at her bedside. The patient stated she wants her husband to be her 28.  I advised in [place] the spouse has the decision rights if there is no HCPOA. The patient said she did not know that and will probably not complete the document because she wanted to appoint her husband to be her [place].  This note was prepared by Jeanine Luz, M.Div..  For questions please contact by phone [phone].

## 2021-05-17 NOTE — Progress Notes (Signed)
Feeling ctx, waiting for epidural Afeb, VSS, BP 130-150/80-90 FHT-120, Cat I, ctx q 4-5 min on 15 mu/min pitocin, normal uterine tone VE-I was unable to reach her cervix U/s confirms vtx, with head in pelvis Will get epidural, continue pitocin, continue clinda for +GBS.  Will have RN check cervix after she gets epidural and more comfortable, monitor progress.

## 2021-05-17 NOTE — Progress Notes (Signed)
Patient ID: Beth [name], female   DOB: 1986/07/20, 35 y.o.   MRN: [id number] Pt had a headache earlier - states resolved with tylenol; sleeping She is appreciating contractions but reports as tolerable. +Fms  VS: 139/70 (139-159/70-77), 77  GEN - NAD EFM - cat 1, 120s TOCO - rare contractions SVE - 1/70/-2, midposition ( vs posterior)   A/P: 34yo [id number] at 38 5/7wks     1. PReE - On MgSO4                     - received one dose IV labetlalol ; protocol in place                    - s/p procardia 30xl in pm of 7/21                     - HA resolved        2. Labor - on pitocin 31ms; increase at 1/163m                      - cooks catheter placed with 60u/40v                      - AROM when able

## 2021-05-17 NOTE — Progress Notes (Signed)
Patient waiting for CBC result before getting epidural. Requesting IV pain med while waiting

## 2021-05-17 NOTE — Progress Notes (Signed)
Holding IV labetalol per Dr. Royce Macadamia due to patient just receiving epidural.

## 2021-05-17 NOTE — Anesthesia Preprocedure Evaluation (Signed)
Anesthesia Evaluation  Patient identified by MRN, date of birth, ID band Patient awake    Reviewed: Allergy & Precautions, NPO status , Patient's Chart, lab work & pertinent test results  Airway Mallampati: III  TM Distance: >3 FB Neck ROM: Full    Dental no notable dental hx.    Pulmonary neg pulmonary ROS,    Pulmonary exam normal breath sounds clear to auscultation       Cardiovascular hypertension (preeclampsia), Pt. on medications Normal cardiovascular exam Rhythm:Regular Rate:Normal     Neuro/Psych  Headaches, Anxiety negative psych ROS   GI/Hepatic negative GI ROS, Neg liver ROS,   Endo/Other  Morbid obesity  Renal/GU negative Renal ROS  negative genitourinary   Musculoskeletal  (+) Arthritis , Bulging lumbar discs   Abdominal   Peds negative pediatric ROS (+)  Hematology negative hematology ROS (+) anemia ,   Anesthesia Other Findings   Reproductive/Obstetrics (+) Pregnancy                             Anesthesia Physical Anesthesia Plan  ASA: 4  Anesthesia Plan: Epidural   Post-op Pain Management:    Induction:   PONV Risk Score and Plan: 2  Airway Management Planned: Natural Airway  Additional Equipment:   Intra-op Plan:   Post-operative Plan:   Informed Consent: I have reviewed the patients History and Physical, chart, labs and discussed the procedure including the risks, benefits and alternatives for the proposed anesthesia with the patient or authorized representative who has indicated his/her understanding and acceptance.       Plan Discussed with: Anesthesiologist  Anesthesia Plan Comments:         Anesthesia Quick Evaluation

## 2021-05-17 NOTE — Anesthesia Procedure Notes (Signed)
Epidural Patient location during procedure: OB Start time: 05/17/2021 9:48 AM End time: 05/17/2021 9:55 AM  Preanesthetic Checklist Completed: patient identified, IV checked, site marked, risks and benefits discussed, surgical consent, monitors and equipment checked, pre-op evaluation and timeout performed  Epidural Patient position: sitting Prep: DuraPrep and site prepped and draped Patient monitoring: continuous pulse ox and blood pressure Approach: midline Location: L3-L4 Injection technique: LOR air  Needle:  Needle type: Tuohy  Needle gauge: 17 G Needle length: 9 cm and 9 Needle insertion depth: 8 cm Catheter type: closed end flexible Catheter size: 19 Gauge Catheter at skin depth: 14 cm Test dose: negative and Other  Assessment Events: blood not aspirated, injection not painful, no injection resistance, no paresthesia and negative IV test  Additional Notes Patient identified. Risks and benefits discussed including failed block, incomplete  Pain control, post dural puncture headache, nerve damage, paralysis, blood pressure Changes, nausea, vomiting, reactions to medications-both toxic and allergic and post Partum back pain. All questions were answered. Patient expressed understanding and wished to proceed. Sterile technique was used throughout procedure. Epidural site was Dressed with sterile barrier dressing. No paresthesias, signs of intravascular injection Or signs of intrathecal spread were encountered.  Patient was more comfortable after the epidural was dosed. Please see RN's note for documentation of vital signs and FHR which are stable. Reason for block:procedure for pain

## 2021-05-17 NOTE — Progress Notes (Signed)
Beth [name] is a 35 y.o. G3P2002 at 43w5dby ultrasound admitted for induction of labor due to Pre-eclamptic toxemia of pregnancy..  Subjective:   Objective: BP 135/88   Pulse 77   Temp (!) 97.5 F (36.4 C) (Oral) Comment: pt drinking cold water will retake  Resp 18   Ht '5\' 11"'$  (1.803 m)   Wt (!) 176 kg   BMI 54.12 kg/m  I/O last 3 completed shifts: In: 1058.8 [P.O.:600; I.V.:358.8; IV Piggyback:100] Out: 150 [Urine:150] Total I/O In: 2072 [P.O.:1452; I.V.:570; IV Piggyback:50] Out: 6075 [Urine:6075]  FHT:  FHR: 120 bpm, variability: moderate,  accelerations:  Present,  decelerations:  Absent UC:   difficult to monitor SVE:   5/70/-3 [place] Edgett  Labs: Lab Results  Component Value Date   WBC 7.2 05/16/2021   HGB 12.2 05/16/2021   HCT 37.4 05/16/2021   MCV 85.4 05/16/2021   PLT 230 05/16/2021    Assessment / Plan: Induction of labor due to preeclampsia,  progressing well on pitocin  Labor:  progressing on pitocin at 157m ( max 1554m and s/p cooks catheter. AROM done ; scant return of fluid  . IUPC placed  Preeclampsia:  on magnesium sulfate, labs stable, and now with recurrence of HA. Rx for fioricet placed  Fetal Wellbeing:  Category I Pain Control:  Labor support without medications I/D:  n/a Anticipated MOD:   TOLAC  CecVenetia Nightnga 05/17/2021, 4:58 AM

## 2021-05-18 DIAGNOSIS — O34219 Maternal care for unspecified type scar from previous cesarean delivery: Secondary | ICD-10-CM | POA: Diagnosis not present

## 2021-05-18 MED ORDER — LACTATED RINGERS IV SOLN: INTRAVENOUS | Status: DC

## 2021-05-18 MED ORDER — MEASLES, MUMPS & RUBELLA VAC IJ SOLR: 0.5000 mL | Freq: Once | INTRAMUSCULAR | Status: DC

## 2021-05-18 MED ORDER — PRENATAL MULTIVITAMIN CH
1.0000 | ORAL_TABLET | Freq: Every day | ORAL | Status: DC
  Administered 2021-05-18 – 2021-05-20 (×3): 1 via ORAL
  Filled 2021-05-18 (×3): qty 1

## 2021-05-18 MED ORDER — MISOPROSTOL 200 MCG PO TABS
800.0000 ug | ORAL_TABLET | Freq: Once | ORAL | Status: AC
  Administered 2021-05-18: 800 ug via RECTAL

## 2021-05-18 MED ORDER — IBUPROFEN 600 MG PO TABS
600.0000 mg | ORAL_TABLET | Freq: Four times a day (QID) | ORAL | Status: DC
  Administered 2021-05-18 – 2021-05-20 (×9): 600 mg via ORAL
  Filled 2021-05-18 (×9): qty 1

## 2021-05-18 MED ORDER — TRANEXAMIC ACID-NACL 1000-0.7 MG/100ML-% IV SOLN
INTRAVENOUS | Status: AC
  Administered 2021-05-18: 1000 mg via INTRAVENOUS
  Filled 2021-05-18: qty 100

## 2021-05-18 MED ORDER — ONDANSETRON HCL 4 MG PO TABS: 4.0000 mg | ORAL_TABLET | ORAL | Status: DC | PRN

## 2021-05-18 MED ORDER — TRANEXAMIC ACID-NACL 1000-0.7 MG/100ML-% IV SOLN: 1000.0000 mg | INTRAVENOUS | Status: AC

## 2021-05-18 MED ORDER — ONDANSETRON HCL 4 MG/2ML IJ SOLN: 4.0000 mg | INTRAMUSCULAR | Status: DC | PRN

## 2021-05-18 MED ORDER — COCONUT OIL OIL: 1.0000 "application " | TOPICAL_OIL | Status: DC | PRN

## 2021-05-18 MED ORDER — TRANEXAMIC ACID-NACL 1000-0.7 MG/100ML-% IV SOLN
1000.0000 mg | INTRAVENOUS | Status: AC
  Administered 2021-05-18: 1000 mg via INTRAVENOUS

## 2021-05-18 MED ORDER — OXYCODONE HCL 5 MG PO TABS: 10.0000 mg | ORAL_TABLET | ORAL | Status: DC | PRN

## 2021-05-18 MED ORDER — MAGNESIUM HYDROXIDE 400 MG/5ML PO SUSP: 30.0000 mL | ORAL | Status: DC | PRN

## 2021-05-18 MED ORDER — DIBUCAINE (PERIANAL) 1 % EX OINT: 1.0000 "application " | TOPICAL_OINTMENT | CUTANEOUS | Status: DC | PRN

## 2021-05-18 MED ORDER — MISOPROSTOL 200 MCG PO TABS: 800.0000 ug | ORAL_TABLET | Freq: Once | ORAL | Status: AC

## 2021-05-18 MED ORDER — TETANUS-DIPHTH-ACELL PERTUSSIS 5-2.5-18.5 LF-MCG/0.5 IM SUSY: 0.5000 mL | PREFILLED_SYRINGE | Freq: Once | INTRAMUSCULAR | Status: DC

## 2021-05-18 MED ORDER — METHYLERGONOVINE MALEATE 0.2 MG PO TABS
0.2000 mg | ORAL_TABLET | ORAL | Status: DC | PRN
Start: 2021-05-18 — End: 2021-05-18

## 2021-05-18 MED ORDER — SIMETHICONE 80 MG PO CHEW
80.0000 mg | CHEWABLE_TABLET | ORAL | Status: DC | PRN
  Administered 2021-05-18 (×2): 80 mg via ORAL
  Filled 2021-05-18 (×2): qty 1

## 2021-05-18 MED ORDER — METHYLERGONOVINE MALEATE 0.2 MG/ML IJ SOLN
0.2000 mg | INTRAMUSCULAR | Status: DC | PRN
Start: 2021-05-18 — End: 2021-05-18

## 2021-05-18 MED ORDER — DIPHENHYDRAMINE HCL 25 MG PO CAPS: 25.0000 mg | ORAL_CAPSULE | Freq: Four times a day (QID) | ORAL | Status: DC | PRN

## 2021-05-18 MED ORDER — ZOLPIDEM TARTRATE 5 MG PO TABS: 5.0000 mg | ORAL_TABLET | Freq: Every evening | ORAL | Status: DC | PRN

## 2021-05-18 MED ORDER — MISOPROSTOL 200 MCG PO TABS
ORAL_TABLET | ORAL | Status: AC
  Filled 2021-05-18: qty 4

## 2021-05-18 MED ORDER — SENNOSIDES-DOCUSATE SODIUM 8.6-50 MG PO TABS
2.0000 | ORAL_TABLET | Freq: Every day | ORAL | Status: DC
  Administered 2021-05-19 – 2021-05-20 (×2): 2 via ORAL
  Filled 2021-05-18 (×2): qty 2

## 2021-05-18 MED ORDER — WITCH HAZEL-GLYCERIN EX PADS: 1.0000 "application " | MEDICATED_PAD | CUTANEOUS | Status: DC | PRN

## 2021-05-18 MED ORDER — BENZOCAINE-MENTHOL 20-0.5 % EX AERO: 1.0000 "application " | INHALATION_SPRAY | CUTANEOUS | Status: DC | PRN

## 2021-05-18 MED ORDER — OXYCODONE HCL 5 MG PO TABS: 5.0000 mg | ORAL_TABLET | ORAL | Status: DC | PRN

## 2021-05-18 MED ORDER — ACETAMINOPHEN 325 MG PO TABS
650.0000 mg | ORAL_TABLET | ORAL | Status: DC | PRN
  Administered 2021-05-20: 650 mg via ORAL
  Filled 2021-05-18: qty 2

## 2021-05-18 NOTE — Lactation Note (Signed)
This note was copied from a baby's chart. Lactation Consultation Note  Patient Name: Beth [name] [id number] Date: 05/18/2021 Reason for consult: L&D Initial assessment;Early term 37-38.6wks Age:34 hours   L&D Initial Consult:  Arrived < 1 hours after birth RN in room with mother when I arrived.  Mother is not interested in having a lactation visit at this time.  She would like a visit after she transfers out of labor and delivery.   Maternal Data    Feeding Mother's Current Feeding Choice: Breast Milk  LATCH Score                    Lactation Tools Discussed/Used    Interventions    Discharge    Consult Status Consult Status: Follow-up Date: 05/18/21 Follow-up type: In-patient    Little Ishikawa 05/18/2021, 5:39 AM

## 2021-05-18 NOTE — Lactation Note (Signed)
This note was copied from a baby's chart. Lactation Consultation Note  Patient Name: Beth [name] [id number] Date: 05/18/2021 Reason for consult: Initial assessment;Early term 37-38.6wks Age:34 hours  LC in to visit with P3 Mom of ET infant delivered by C/S.  Baby is in football hold on right breast.  LC added pillow support under baby.   Mom encouraged to not pull breast away from baby's nose to avoid losing depth on the breast.  Mom has large, heavy breasts with erect nipples and compressible areola.  Reviewed breastfeeding basics. Encouraged STS and feeding baby often with cues. Lactation brochure provided and Mom aware of IP and OP Lactation support available to her.   Mom encouraged to ask for help prn  Maternal Data Has patient been taught Hand Expression?: Yes Does the patient have breastfeeding experience prior to this delivery?: Yes How long did the patient breastfeed?: 1 year  Feeding Mother's Current Feeding Choice: Breast Milk  LATCH Score Latch: Grasps breast easily, tongue down, lips flanged, rhythmical sucking.  Audible Swallowing: A few with stimulation  Type of Nipple: Everted at rest and after stimulation  Comfort (Breast/Nipple): Soft / non-tender  Hold (Positioning): Assistance needed to correctly position infant at breast and maintain latch.  LATCH Score: 8   Interventions Interventions: Breast feeding basics reviewed;Assisted with latch;Skin to skin;Breast massage;Hand express;Breast compression;Adjust position;Support pillows;Position options;Education  Discharge [place] Program: No  Consult Status Consult Status: Follow-up Date: 05/19/21 Follow-up type: [place] 05/18/2021, 11:59 AM

## 2021-05-18 NOTE — Anesthesia Postprocedure Evaluation (Signed)
Anesthesia Post Note  Patient: [name]  Procedure(s) Performed: AN AD [place]     Patient location during evaluation: [facility] Anesthesia Type: Epidural Level of consciousness: awake and alert, oriented and patient cooperative Pain management: pain level controlled Vital Signs Assessment: post-procedure vital signs reviewed and stable Respiratory status: spontaneous breathing Cardiovascular status: stable Postop Assessment: no headache, epidural receding, patient able to bend at knees and no signs of nausea or vomiting Anesthetic complications: no Comments: Pt. States she is walking.  Pain score 1.    No notable events documented.  Last Vitals:  Vitals:   05/18/21 1700 05/18/21 1800  BP:    Pulse:    Resp: 18 18  Temp:    SpO2:      Last Pain:  Vitals:   05/18/21 1633  TempSrc: Oral  PainSc:    Pain Goal:                   [facility]

## 2021-05-19 LAB — CBC
HCT: 29.4 % — ABNORMAL LOW (ref 36.0–46.0)
Hemoglobin: 9.9 g/dL — ABNORMAL LOW (ref 12.0–15.0)
MCH: 28.7 pg (ref 26.0–34.0)
MCHC: 33.7 g/dL (ref 30.0–36.0)
MCV: 85.2 fL (ref 80.0–100.0)
Platelets: 216 10*3/uL (ref 150–400)
RBC: 3.45 MIL/uL — ABNORMAL LOW (ref 3.87–5.11)
RDW: 14.4 % (ref 11.5–15.5)
WBC: 9 10*3/uL (ref 4.0–10.5)
nRBC: 0 % (ref 0.0–0.2)

## 2021-05-19 MED ORDER — NIFEDIPINE ER OSMOTIC RELEASE 60 MG PO TB24: 60.0000 mg | ORAL_TABLET | Freq: Every day | ORAL | Status: DC

## 2021-05-19 MED ORDER — FAMOTIDINE 20 MG PO TABS
20.0000 mg | ORAL_TABLET | Freq: Two times a day (BID) | ORAL | Status: DC
  Administered 2021-05-19 – 2021-05-20 (×4): 20 mg via ORAL
  Filled 2021-05-19 (×4): qty 1

## 2021-05-19 MED ORDER — NIFEDIPINE ER OSMOTIC RELEASE 60 MG PO TB24
60.0000 mg | ORAL_TABLET | Freq: Every day | ORAL | Status: DC
  Administered 2021-05-19 – 2021-05-20 (×2): 60 mg via ORAL
  Filled 2021-05-19 (×2): qty 1

## 2021-05-19 NOTE — Progress Notes (Addendum)
Post Partum Day #1 Subjective: no complaints  Objective: Blood pressure (!) 143/86, pulse 75, temperature 98 F (36.7 C), temperature source Oral, resp. rate 18, height '5\' 11"'$  (1.803 m), weight (!) 176 kg, SpO2 99 %, unknown if currently breastfeeding.  Physical Exam:  General: alert Lochia: appropriate Uterine Fundus: firm  Recent Labs    05/17/21 0821 05/19/21 0521  HGB 11.9* 9.9*  HCT 35.5* 29.4*    Assessment/Plan: Plan for discharge tomorrow if BP remains stable on Procardia, magnesium off this am, bleeding is ok   LOS: 3 days   Clarene Duke 05/19/2021, 9:08 AM

## 2021-05-19 NOTE — Lactation Note (Signed)
This note was copied from a baby's chart. Lactation Consultation Note  Patient Name: Beth [name] [id number] Date: 05/19/2021 Reason for consult: Follow-up assessment;Other (Comment);Term (PIH) Age:34 hours  P3 Mom in bed holding sleeping baby on chest skin to skin. Reports experienced challenges BFing first child due the death of her father was unable to rebuild milk supply, breastfed 2nd child x4yr Mom reports [facility] just finished feeding ~[facility] Mom without questions or concerns, states breastfeeding is going well, better than yesterday, feedings last ~20-40 mins, states stools are brown in color today. Reports having an LCTheresat peds office.  LC reinforced feed on cue 8-12 times a day, signs of adequate milk transfer, monitor wet and stool diapers, engorgement and how to manage, advised of [facility] LC services phone/ outpatient, encouraged to f/u with LC at peds office as needed. Mom voiced understanding and with no further concerns, aware to call for lactation support if needed prior to next visit. Left the room with mom still holding sleeping baby skin to skin. BGilliam, RN, IBCLC   Feeding Mother's Current Feeding Choice: Breast Milk   Interventions Interventions: Breast feeding basics reviewed  Consult Status Consult Status: Follow-up Date: 05/20/21 Follow-up type: In-patient    BrBernita Buffy/24/2022, 12:27 PM

## 2021-05-20 ENCOUNTER — Encounter (HOSPITAL_COMMUNITY): Payer: Self-pay

## 2021-05-20 MED ORDER — IBUPROFEN 800 MG PO TABS: 800.0000 mg | ORAL_TABLET | Freq: Three times a day (TID) | ORAL | 1 refills | Status: DC | PRN

## 2021-05-20 MED ORDER — NIFEDIPINE ER 60 MG PO TB24: 60.0000 mg | ORAL_TABLET | Freq: Two times a day (BID) | ORAL | 1 refills | Status: DC

## 2021-05-20 NOTE — Discharge Summary (Signed)
Postpartum Discharge Summary  Date of Service updated     Patient Name: Beth [name] DOB: [birth year] MRN: [id number]  Date of admission: 05/16/2021 Delivery date:05/18/2021  Delivering provider: Florian Buff  Date of discharge: 05/20/2021  Admitting diagnosis: PIH (pregnancy induced hypertension) [O13.9] Intrauterine pregnancy: [redacted]w[redacted]d    Secondary diagnosis:  Active Problems:   PIH (pregnancy induced hypertension)   VBAC, delivered  Additional problems: Postpartum preE    Discharge diagnosis: Term Pregnancy Delivered, VBAC, and Preeclampsia (severe)                                              Post partum procedures: MgSO4 Augmentation: Pitocin and IP Foley Complications: None  Hospital course: Induction of Labor With Vaginal Delivery   35y.o. yo G3P2002 at 347w1das admitted to the hospital 05/16/2021 for induction of labor.  Indication for induction: Favorable cervix at term and Preeclampsia.  Patient had an uncomplicated labor course as follows: Membrane Rupture Time/Date:  ,05/17/2021   Delivery Method:Vaginal, Spontaneous  Episiotomy: None  Lacerations:  1st degree;Periurethral  Details of delivery can be found in separate delivery note.  Patient had a routine postpartum course. Patient is discharged home 05/20/21.  Newborn Data: Birth date:05/18/2021  Birth time:5:01 AM  Gender:Female  Living status:Living  Apgars:6 ,9  Weight:4536 g   Magnesium Sulfate received: Yes: Seizure prophylaxis postpartum  Physical exam  Vitals:   05/20/21 1139 05/20/21 1516 05/20/21 1530 05/20/21 1615  BP: (!) 155/98 (!) 161/95 140/78 134/75  Pulse: 76 87 75 74  Resp: 18 18    Temp: 98.4 F (36.9 C) 98.4 F (36.9 C)    TempSrc: Oral Oral    SpO2: 99% 99%    Weight:      Height:       General: alert, cooperative, and no distress Lochia: appropriate Uterine Fundus: firm Incision: N/A DVT Evaluation: No evidence of DVT seen on physical exam. Negative Homan's sign. No  cords or calf tenderness. Labs: Lab Results  Component Value Date   WBC 9.0 05/19/2021   HGB 9.9 (L) 05/19/2021   HCT 29.4 (L) 05/19/2021   MCV 85.2 05/19/2021   PLT 216 05/19/2021   CMP Latest Ref Rng & Units 05/16/2021  Glucose 70 - 99 mg/dL 82  BUN 6 - 20 mg/dL 5(L)  Creatinine 0.44 - 1.00 mg/dL 0.62  Sodium 135 - 145 mmol/L 137  Potassium 3.5 - 5.1 mmol/L 3.5  Chloride 98 - 111 mmol/L 107  CO2 22 - 32 mmol/L 22  Calcium 8.9 - 10.3 mg/dL 10.8(H)  Total Protein 6.5 - 8.1 g/dL 6.0(L)  Total Bilirubin 0.3 - 1.2 mg/dL 0.8  Alkaline Phos 38 - 126 U/L 122  AST 15 - 41 U/L 17  ALT 0 - 44 U/L 11   Edinburgh Score: Edinburgh Postnatal Depression Scale Screening Tool 05/19/2021  I have been able to laugh and see the funny side of things. 0  I have looked forward with enjoyment to things. 0  I have blamed myself unnecessarily when things went wrong. 1  I have been anxious or worried for no good reason. 2  I have felt scared or panicky for no good reason. 1  Things have been getting on top of me. 0  I have been so unhappy that I have had difficulty sleeping. 0  I have felt  sad or miserable. 0  I have been so unhappy that I have been crying. 1  The thought of harming myself has occurred to me. 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    Penicillins Rash, Other (See Comments)   Has patient had a PCN reaction causing immediate rash, facial/tongue/throat swelling, SOB or lightheadedness with hypotension: No Has patient had a PCN reaction causing severe rash involving mucus membranes or skin necrosis: No Has patient had a PCN reaction that required hospitalization: No Has patient had a PCN reaction occurring within the last 10 years: Yes If all of the above answers are "NO", then may proceed with Cephalosporin use.   Strawberry (diagnostic)  Itching        Medication List     TAKE these medications    Fusion Plus Caps Take 1 capsule by mouth daily.   ibuprofen 800 MG tablet Commonly known as: ADVIL Take 1 tablet (800 mg total) by mouth every 8 (eight) hours as needed.   NIFEdipine 60 MG 24 hr tablet Commonly known as: ADALAT CC Take 1 tablet (60 mg total) by mouth in the morning and at bedtime.   Prenatal Gummies/DHA & FA 0.4-32.5 MG Chew Chew 2 each by mouth daily.         Discharge home in stable condition Infant Feeding: Breast Infant Disposition:home with mother Discharge instruction: per After Visit Summary and Postpartum booklet. Activity: Advance as tolerated. Pelvic rest for 6 weeks.  Diet: routine diet Anticipated Birth Control: Unsure Postpartum Appointment:6 weeks Additional Postpartum F/U: BP check 1 week    05/20/2021 Deliah Boston, MD

## 2021-05-20 NOTE — Progress Notes (Signed)
Bps have responded well to AM procardia dosing. Plan to DC patient home on Nif 60XL BID, bring back to office in 1 wk

## 2021-05-20 NOTE — Lactation Note (Signed)
This note was copied from a baby's chart. Lactation Consultation Note  Patient Name: Beth [name] [id number] Date: 05/20/2021 Reason for consult: Follow-up assessment;Early term 37-38.6wks Age:33 hours  LC in to visit with P3 Mom of ET infant on day of probable discharge.  Baby is at 1.8% weight loss, good output, bilirubin WNL and baby has been latching well per Mom.  Mom states she offered baby formula because she was cluster feeding.  Reviewed normal newborn behavior.   Mom has a DEBP at home.  Recommended double pumping for 15-30 mins if baby is receiving formula supplementation.  Mom's goal is to exclusively breastfeed.  Encouraged STS and feeding baby often with cues.    Offered to refer to OP lactation consultant, but Mom believes her Pediatrician has an [place].  Encouraged Mom to call prn.  Lactation Tools Discussed/Used Tools: Bottle  per Mom's choice to supplement  Interventions Interventions: Breast feeding basics reviewed;Skin to skin;Breast massage;Hand express;Education  Discharge Discharge Education: Engorgement and breast care;Outpatient recommendation Pump: Personal  Consult Status Consult Status: Complete (mother declined follow up) (will call and schedule prn) Date: 05/20/21 Follow-up type: Call as needed    Broadus John 05/20/2021, 11:49 AM

## 2021-05-20 NOTE — Progress Notes (Signed)
POSTPARTUM PROGRESS NOTE  Post Partum Day #2  Subjective:  No acute events overnight.  Pt denies problems with ambulating, voiding or po intake.  She denies nausea or vomiting.  Pain is well controlled.   Lochia Minimal. Still working on milk supply, denies PreE symptoms this morning. Reviewed BP trend thus far after MgSO4 and impact meds may have on lactations  Objective: Blood pressure (!) 151/99, pulse 76, temperature 98.4 F (36.9 C), temperature source Oral, resp. rate 18, height '5\' 11"'$  (1.803 m), weight (!) 176 kg, SpO2 100 %, unknown if currently breastfeeding.  Physical Exam:  General: alert, cooperative and no distress Lochia:normal flow Chest: CTAB Heart: RRR no m/r/g Abdomen: +BS, soft, nontender Uterine Fundus: firm, 2cm below umbilicus GU: suture intact, healing well, no purulent drainage Extremities: trace edema, neg calf TTP BL, neg Homans BL  Recent Labs    05/19/21 0521  HGB 9.9*  HCT 29.4*    Assessment/Plan:  ASSESSMENT: Ermel Vanbeek is a 35 y.o. [id number] s/p SVD @ [redacted]w[redacted]d PP course c/b uterine atony with uterotonics required. PNC c/b PreE with SF (IV meds).   Is s/p 24hrs MgSO4, received PO nifedipine '60mg'$  last night. Will repeat dose this AM and monitor Bps until this afternoon, if stable may be discharged.  Asymptomatic from PreE standpoint   LOS: 4 days

## 2021-05-30 ENCOUNTER — Telehealth (HOSPITAL_COMMUNITY): Payer: Self-pay

## 2021-05-30 NOTE — Telephone Encounter (Signed)
"  I'm doing great." Patient has no questions or concerns about her healing.  "She's perfect .All is good. She is eating and gaining weight. She sleeps in a bassinet in our room." RN reviewed the ABC's of safe sleep. Patient has no questions or concerns about baby.  EPDS score is 1.  Sharyn Lull [facility] 05/30/2021,1633

## 2021-06-03 ENCOUNTER — Inpatient Hospital Stay (HOSPITAL_COMMUNITY): Admit: 2021-06-03 | Payer: BLUE CROSS/BLUE SHIELD | Admitting: Obstetrics and Gynecology

## 2021-06-03 ENCOUNTER — Encounter (HOSPITAL_COMMUNITY): Payer: Self-pay

## 2021-06-03 SURGERY — Surgical Case
Anesthesia: Spinal

## 2022-04-09 IMAGING — RF DG HYSTEROGRAM
3 series · 7 of 7 positions shown · IV contrast (omnipaque)
Comparison: None.

CLINICAL DATA: Infertility.

EXAM:
HYSTEROSALPINGOGRAM
TECHNIQUE: Following cleansing of the cervix and vagina with Betadine solution,
a hysterosalpingogram was performed using a 5-French
hysterosalpingogram catheter and Omnipaque 300 contrast. The patient
tolerated the examination without difficulty.

[Series 1: one shot · 2 of 2 slices shown (1 of 2)]
[im 1/2]
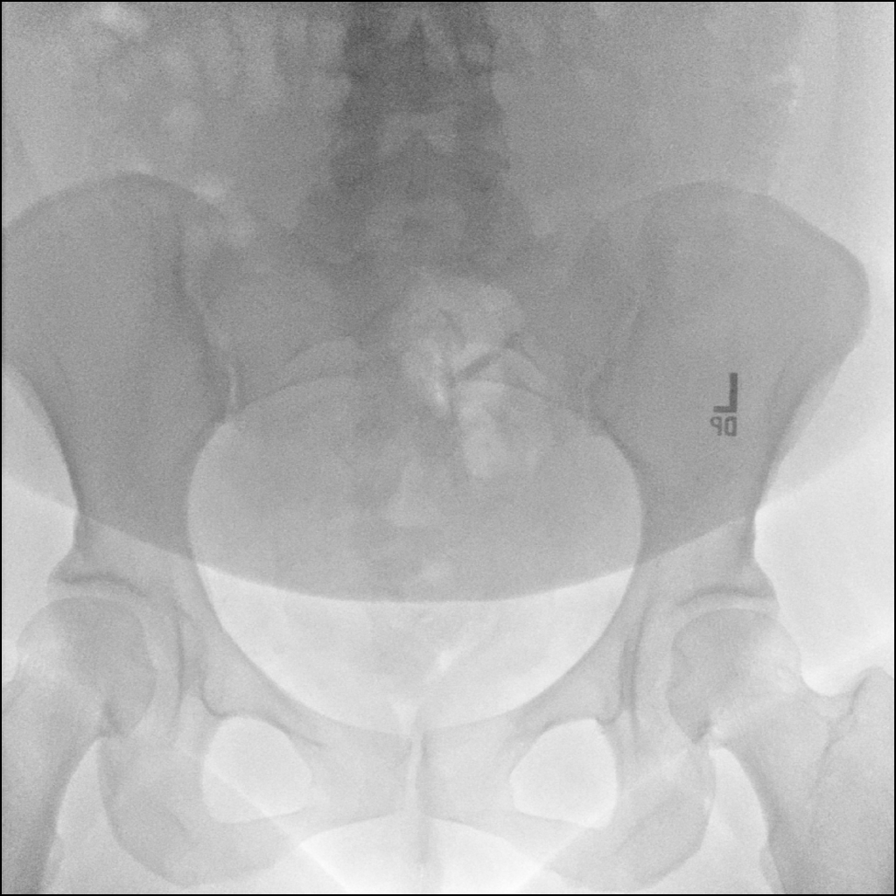
[im 2/2]
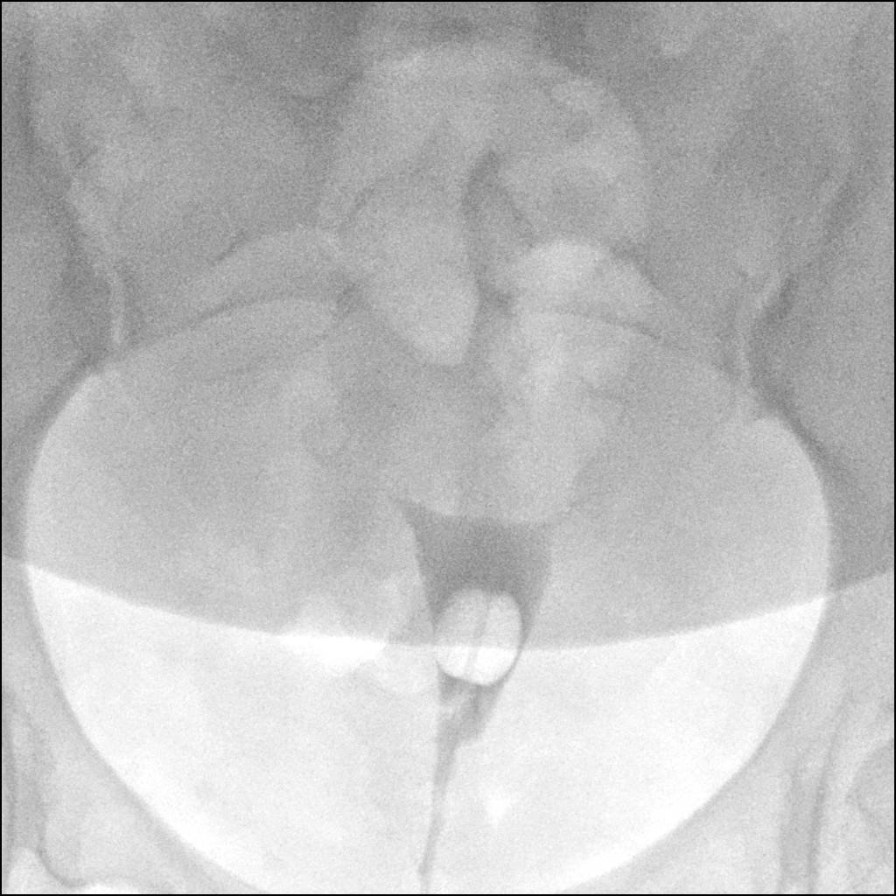

[Series 2: sequence · 3 of 8 frames shown]
[frame 2/8]
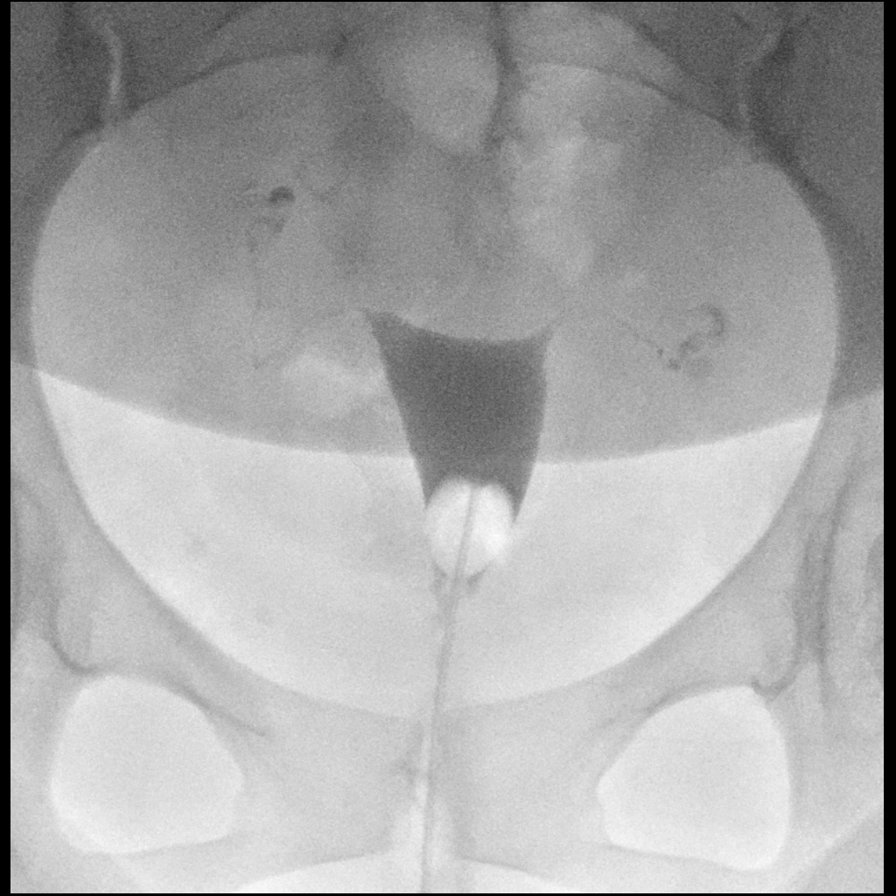
[frame 5/8]
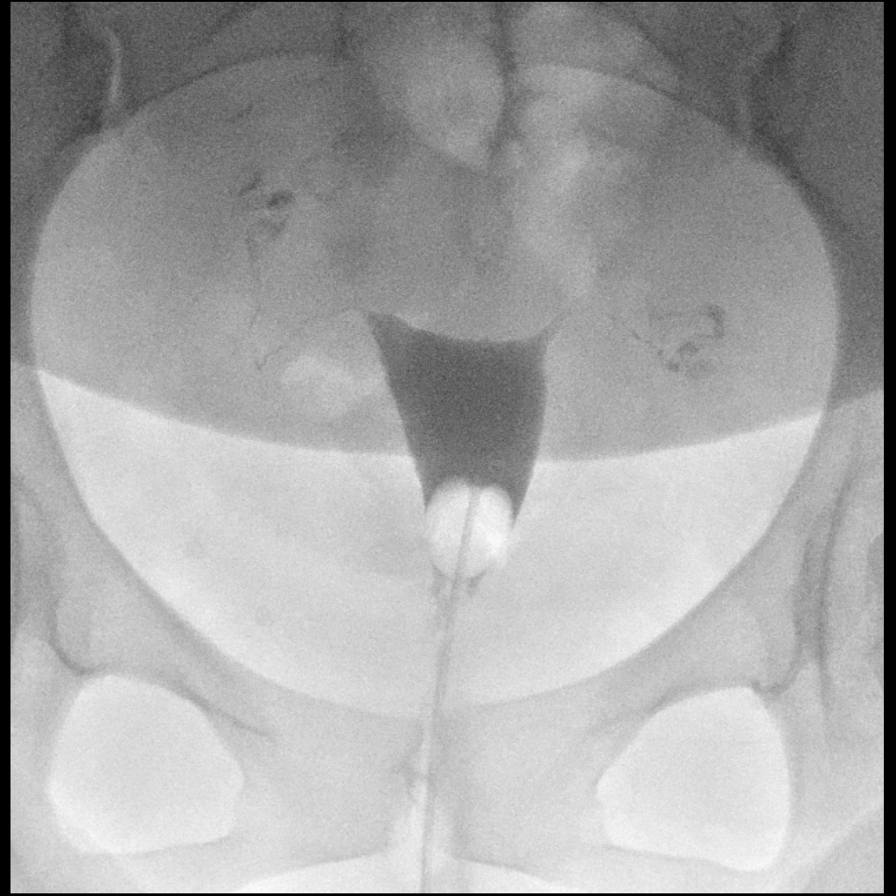
[frame 7/8]
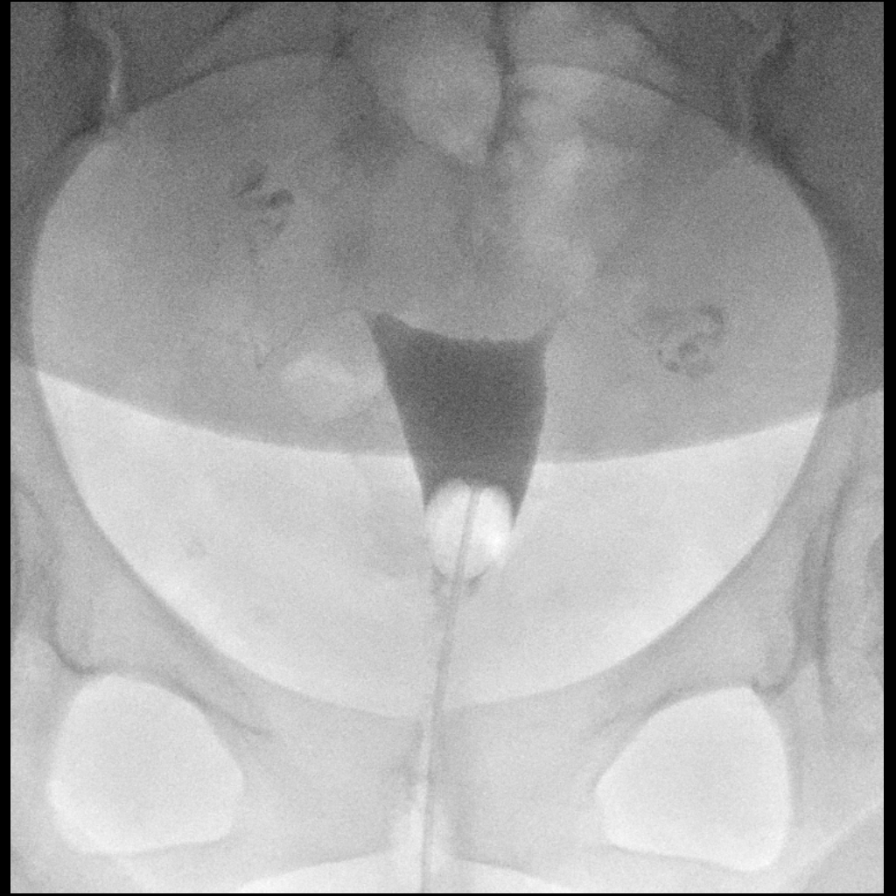

[Series 3: one shot · 2 of 2 slices shown (2 of 2)]
[im 1/2]
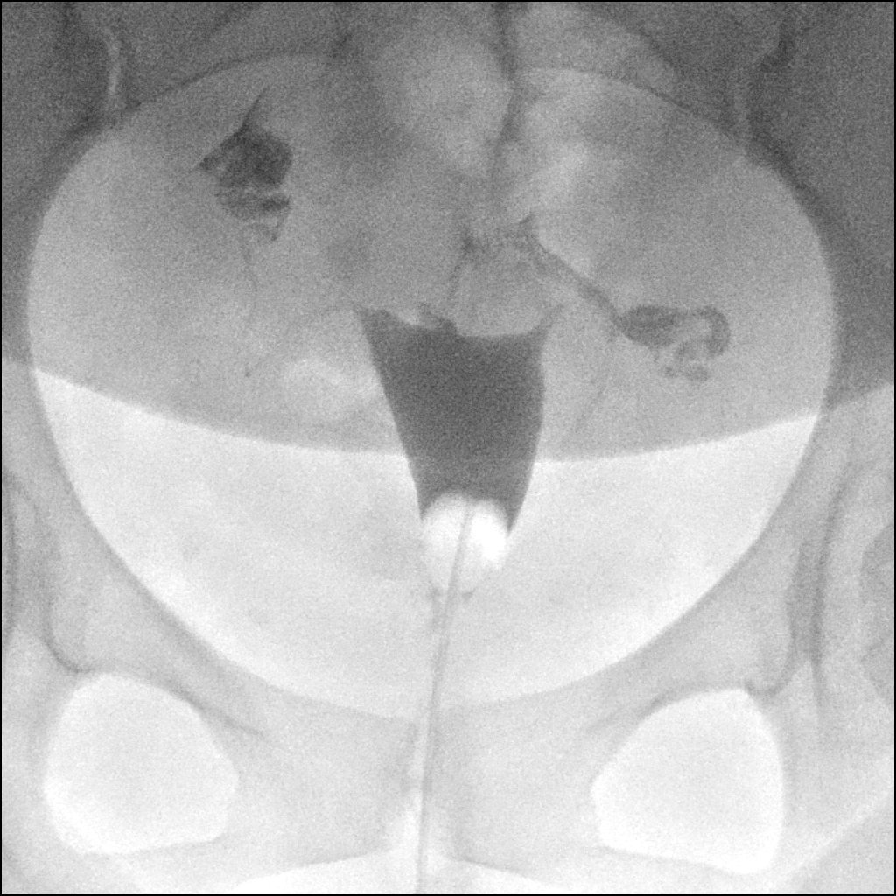
[im 2/2]
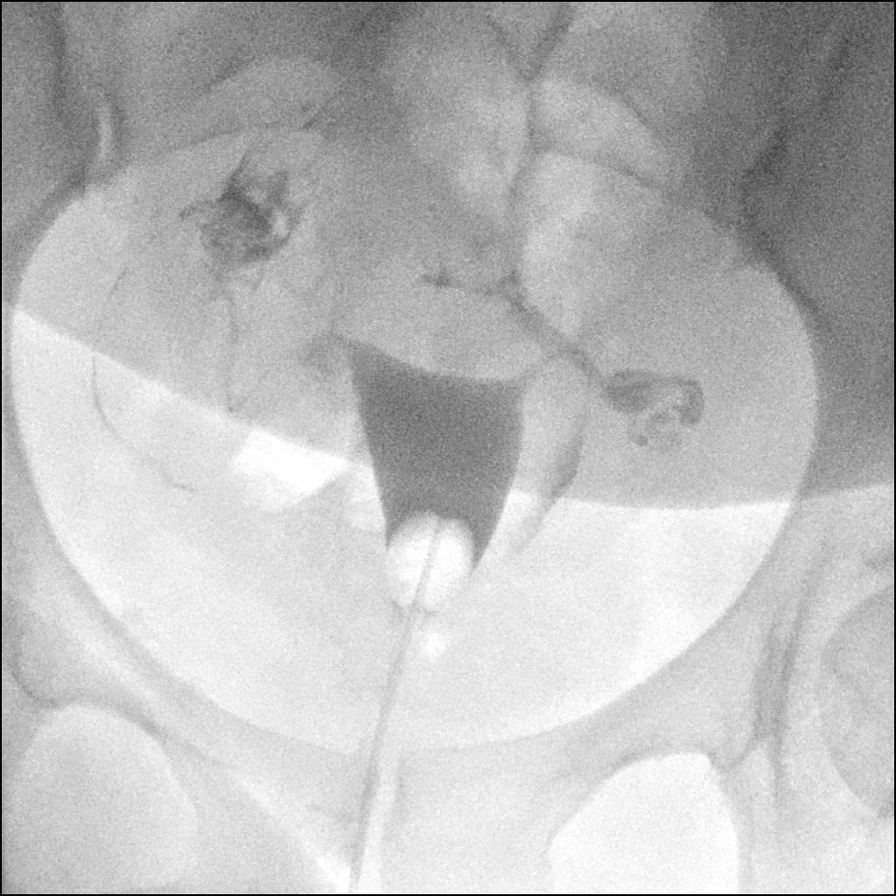

[7 of 7 positions shown; findings below may reference images not displayed]

FLUOROSCOPY TIME:  Radiation Exposure Index (as provided by the
fluoroscopic device): 48 seconds

If the device does not provide the exposure index:

Fluoroscopy Time:  275 mGy

Number of Acquired Images:  0
FINDINGS: The endometrial cavity is normal in appearance and contour. No signs
of mullerian duct anomaly.

Opacification of both fallopian tubes is seen. Both tubes appear
normal. Intraperitoneal spill of contrast from both fallopian tubes
is demonstrated.
IMPRESSION: Normal study. Both fallopian tubes are patent.

## 2022-12-10 ENCOUNTER — Other Ambulatory Visit (HOSPITAL_BASED_OUTPATIENT_CLINIC_OR_DEPARTMENT_OTHER): Payer: Self-pay

## 2022-12-10 MED ORDER — ZEPBOUND 7.5 MG/0.5ML ~~LOC~~ SOAJ
7.5000 mg | SUBCUTANEOUS | 0 refills | Status: DC
  Filled 2022-12-10: qty 2, 28d supply, fill #0

## 2022-12-11 ENCOUNTER — Other Ambulatory Visit (HOSPITAL_BASED_OUTPATIENT_CLINIC_OR_DEPARTMENT_OTHER): Payer: Self-pay

## 2022-12-15 ENCOUNTER — Other Ambulatory Visit (HOSPITAL_BASED_OUTPATIENT_CLINIC_OR_DEPARTMENT_OTHER): Payer: Self-pay

## 2022-12-16 ENCOUNTER — Other Ambulatory Visit (HOSPITAL_BASED_OUTPATIENT_CLINIC_OR_DEPARTMENT_OTHER): Payer: Self-pay

## 2022-12-18 ENCOUNTER — Other Ambulatory Visit (HOSPITAL_BASED_OUTPATIENT_CLINIC_OR_DEPARTMENT_OTHER): Payer: Self-pay

## 2022-12-19 ENCOUNTER — Other Ambulatory Visit (HOSPITAL_BASED_OUTPATIENT_CLINIC_OR_DEPARTMENT_OTHER): Payer: Self-pay

## 2022-12-22 ENCOUNTER — Other Ambulatory Visit (HOSPITAL_BASED_OUTPATIENT_CLINIC_OR_DEPARTMENT_OTHER): Payer: Self-pay

## 2022-12-23 ENCOUNTER — Other Ambulatory Visit (HOSPITAL_BASED_OUTPATIENT_CLINIC_OR_DEPARTMENT_OTHER): Payer: Self-pay

## 2022-12-25 ENCOUNTER — Other Ambulatory Visit (HOSPITAL_BASED_OUTPATIENT_CLINIC_OR_DEPARTMENT_OTHER): Payer: Self-pay

## 2023-01-06 ENCOUNTER — Other Ambulatory Visit (HOSPITAL_BASED_OUTPATIENT_CLINIC_OR_DEPARTMENT_OTHER): Payer: Self-pay

## 2023-01-13 ENCOUNTER — Other Ambulatory Visit (HOSPITAL_BASED_OUTPATIENT_CLINIC_OR_DEPARTMENT_OTHER): Payer: Self-pay

## 2023-10-13 ENCOUNTER — Other Ambulatory Visit: Payer: Self-pay

## 2023-10-13 ENCOUNTER — Encounter (HOSPITAL_BASED_OUTPATIENT_CLINIC_OR_DEPARTMENT_OTHER): Payer: Self-pay | Admitting: Urology

## 2023-10-13 DIAGNOSIS — O9A211 Injury, poisoning and certain other consequences of external causes complicating pregnancy, first trimester: Secondary | ICD-10-CM | POA: Diagnosis present

## 2023-10-13 DIAGNOSIS — Z5321 Procedure and treatment not carried out due to patient leaving prior to being seen by health care provider: Secondary | ICD-10-CM | POA: Diagnosis not present

## 2023-10-13 DIAGNOSIS — Y92410 Unspecified street and highway as the place of occurrence of the external cause: Secondary | ICD-10-CM | POA: Diagnosis not present

## 2023-10-13 DIAGNOSIS — R0781 Pleurodynia: Secondary | ICD-10-CM | POA: Diagnosis not present

## 2023-10-13 LAB — PREGNANCY, URINE: Preg Test, Ur: POSITIVE — AB

## 2023-10-13 LAB — URINALYSIS, ROUTINE W REFLEX MICROSCOPIC
Bilirubin Urine: NEGATIVE
Glucose, UA: NEGATIVE mg/dL
Hgb urine dipstick: NEGATIVE
Ketones, ur: NEGATIVE mg/dL
Leukocytes,Ua: NEGATIVE
Nitrite: NEGATIVE
Protein, ur: NEGATIVE mg/dL
Specific Gravity, Urine: 1.01 (ref 1.005–1.030)
pH: 6 (ref 5.0–8.0)

## 2023-10-13 NOTE — ED Triage Notes (Signed)
Pt state chest tightness x 3 weeks States 1 week ago was in [place] [facility] and started having Right sided rib cage pain    [redacted] week gestation  Being seen at fertility clinic  G-6, P-3

## 2023-10-14 ENCOUNTER — Emergency Department (HOSPITAL_BASED_OUTPATIENT_CLINIC_OR_DEPARTMENT_OTHER)
Admission: EM | Admit: 2023-10-14 | Discharge: 2023-10-14 | Discharge status: Against Medical Advice | Payer: BC Managed Care – PPO | Attending: Emergency Medicine | Admitting: Emergency Medicine

## 2023-11-13 LAB — OB RESULTS CONSOLE HEPATITIS B SURFACE ANTIGEN: Hepatitis B Surface Ag: NEGATIVE

## 2023-11-13 LAB — OB RESULTS CONSOLE RPR: RPR: NONREACTIVE

## 2023-11-13 LAB — OB RESULTS CONSOLE HIV ANTIBODY (ROUTINE TESTING): HIV: NONREACTIVE

## 2023-11-13 LAB — OB RESULTS CONSOLE RUBELLA ANTIBODY, IGM: Rubella: IMMUNE

## 2024-03-03 ENCOUNTER — Encounter: Payer: Self-pay | Admitting: Neurology

## 2024-03-23 NOTE — Progress Notes (Unsigned)
 NEUROLOGY CONSULTATION NOTE  Beth [name] MRN: [id number] DOB: 1986/07/01  Referring provider: Lovell Rubenstein, FNP Primary care provider: [facility]  Reason for consult:  migraines  Assessment/Plan:   New onset headache during pregnancy.  Likely change in migraine but as it is new semiology and exertional, will check MRI/MRA of head without contrast to evaluate for aneurysm or other secondary intracranial etiology. Pregnant at [redacted] weeks gestation Pre-eclampsia Migraine without aura, without status migrainosus, not intractable     Check MRI/MRA of head without contrast In meantime, Tylenol  as needed.  Limit use of pain relievers to no more than 9 days out of the month to prevent risk of rebound or medication-overuse headache. Contact OBGYN regarding elevated blood pressure Plan to follow up one month after giving birth or sooner if needed.   Subjective:  Beth [name] is a 38 year old right-handed female with iron-deficiency anemia who presents for migraines.  History supplemented by referring provider's note.  History of migraines since childhood.  5-6/10, pressure/squeezing in bandlike distribution.  Associated nausea, vomiting, photophobia, phonophobia but no visual disturbance, slurred speech or unilateral numbness or weakness.  No preceding aura.  Typically lasts 1-2 days (treats with Tylenol , ibuprofen  and cola). Improved after having children, down to 5-6 a year.  Triggers:  alcohol, stress.  Not associated with menstrual cycle.  Relieving factors:  rest in dark quiet room.     She is currently pregnant at [redacted] weeks gestation.  During this pregnancy she has been having particularly more severe migraines.  Pain is now a more severe 7/10 sharp over right eye associated with generalized joint pain and diffuse weakness, photophobia and phonophobia.  No nausea or visual symptoms, or unilateral numbness or weakness.  Cannot function.  Significant worsening in pain with  valsalva maneuver/coughing or bending over.  Lasting 3 days.  Initially occurring weekly every 4 days (each lasting 3 days).  Last "full-blown" migraine occurred a month ago but now a dull ache every 2 weeks.    She has 3 other children.  For first two pregnancies, she had her habitual migraines well controlled.  No migraines with her third pregnancy.  She currently has preeclampsia as well as during her 2nd and 3rd pregnancies.   Past NSAIDS/analgesics:  Fioricet , ibuprofen  800mg , Tylenol  Past abortive triptans:  sumatriptan tab, rizatriptan 10mg  (effective within 1 hour) Past abortive ergotamine:  none Past muscle relaxants:  none Past anti-emetic:  Zofran  ODT 8mg , Reglan  Past antihypertensive medications:  labetolol Past antidepressant medications:  sertraline Past anticonvulsant medications:  none Past anti-CGRP:  none Past vitamins/Herbal/Supplements:  none Past antihistamines/decongestants:  Benadryl , scopolamine  Other past therapies:  none  Current NSAIDS/analgesics:  Tylenol  Current triptans:  none Current ergotamine:  none Current anti-emetic:  none Current muscle relaxants:  none Current Antihypertensive medications:  none Current Antidepressant medications:  none Current Anticonvulsant medications:  none Current anti-CGRP:  none Current Vitamins/Herbal/Supplements:  prenatal, ferrous sulfate, vit D Current Antihistamines/Decongestants:  none Other therapy:  ice packs   Caffeine :  no Exercise:  walking Depression:  no; Anxiety:  no Sleep hygiene:  overall okay but not as restful  No history of head trauma/concussion Family history of headache:  no. Family history of aneurysms:  no      PAST MEDICAL HISTORY: Past Medical History:  Diagnosis Date   Anemia    Anxiety    Bulging lumbar disc    Headache    otc med prn - last one 4 months ago   History  of blood transfusion    after 04/02/2016 svd at [facility]   SVD (spontaneous vaginal delivery)    x 1    PAST  SURGICAL HISTORY: Past Surgical History:  Procedure Laterality Date   CESAREAN SECTION N/A 05/26/2017   Procedure: CESAREAN SECTION;  Surgeon: Artemisa Bile, MD;  Location: [facility] BIRTHING SUITES;  Service: Obstetrics;  Laterality: N/A;   DILATION AND CURETTAGE OF UTERUS N/A 06/12/2016   Procedure: DILATATION AND CURETTAGE;  Surgeon: Artemisa Bile, MD;  Location: [facility] ORS;  Service: Gynecology;  Laterality: N/A;   TONSILLECTOMY     WISDOM TOOTH EXTRACTION      MEDICATIONS: Current Outpatient Medications on File Prior to Visit  Medication Sig Dispense Refill   ibuprofen  (ADVIL ) 800 MG tablet Take 1 tablet (800 mg total) by mouth every 8 (eight) hours as needed. 60 tablet 1   Iron -FA-B Cmp-C-Biot-Probiotic (FUSION PLUS) CAPS Take 1 capsule by mouth daily.     NIFEdipine  (ADALAT  CC) 60 MG 24 hr tablet Take 1 tablet (60 mg total) by mouth in the morning and at bedtime. 60 tablet 1   Prenatal MV-Min-FA-Omega-3 (PRENATAL GUMMIES/DHA & FA) 0.4-32.5 MG CHEW Chew 2 each by mouth daily.     tirzepatide  (ZEPBOUND ) 7.5 MG/0.5ML Pen Inject 7.5 mg into the skin every 7 (seven) days. 2 mL 0   Current Facility-Administered Medications on File Prior to Visit  Medication Dose Route Frequency Provider Last Rate Last Admin   ceFAZolin  (ANCEF ) 3 g in dextrose  5 % 50 mL IVPB  3 g Intravenous Once Pinn, Walda, MD        ALLERGIES: Allergies  Allergen Reactions   Food Anaphylaxis and Other (See Comments)    Pt is allergic to kiwi.    Penicillins Rash and Other (See Comments)    Has patient had a PCN reaction causing immediate rash, facial/tongue/throat swelling, SOB or lightheadedness with hypotension: No Has patient had a PCN reaction causing severe rash involving mucus membranes or skin necrosis: No Has patient had a PCN reaction that required hospitalization: No Has patient had a PCN reaction occurring within the last 10 years: Yes If all of the above answers are "NO", then may proceed with Cephalosporin use.     Strawberry (Diagnostic) Itching    FAMILY HISTORY: Family History  Problem Relation Age of Onset   Hypertension Other    Cancer Other     Objective:  Blood pressure (!) 170/90, pulse 93, height 5\' 11"  (1.803 m), weight (!) 356 lb (161.5 kg), SpO2 99%, unknown if currently breastfeeding. General: No acute distress.  Patient appears well-groomed.   Head:  Normocephalic/atraumatic Eyes:  fundi examined but not visualized Neck: supple, no paraspinal tenderness, full range of motion Back: No paraspinal tenderness Heart: regular rate and rhythm Lungs: Clear to auscultation bilaterally. Vascular: No carotid bruits. Neurological Exam: Mental status: alert and oriented to person, place, and time, speech fluent and not dysarthric, language intact. Cranial nerves: CN I: not tested CN II: pupils equal, round and reactive to light, visual fields intact CN III, IV, VI:  full range of motion, no nystagmus, no ptosis CN V: facial sensation intact. CN VII: upper and lower face symmetric CN VIII: hearing intact CN IX, X: gag intact, uvula midline CN XI: sternocleidomastoid and trapezius muscles intact CN XII: tongue midline Bulk & Tone: normal, no fasciculations. Motor:  muscle strength 5/5 throughout Sensation:  Pinprick and vibratory sensation intact. Deep Tendon Reflexes:  2+ throughout,  toes downgoing.   Finger to nose  testing:  Without dysmetria.   Gait:  Normal station and stride.  Romberg negative.    Thank you for allowing me to take part in the care of this patient.  Janne Members, DO  CC: Lovell Rubenstein, FNP

## 2024-03-24 ENCOUNTER — Ambulatory Visit: Admitting: Neurology

## 2024-03-24 ENCOUNTER — Inpatient Hospital Stay (HOSPITAL_COMMUNITY)
Admission: AD | Admit: 2024-03-24 | Discharge: 2024-04-27 | DRG: 787 | Disposition: A | Discharge status: Home / Self Care | Attending: Obstetrics and Gynecology | Admitting: Obstetrics and Gynecology

## 2024-03-24 ENCOUNTER — Other Ambulatory Visit: Payer: Self-pay

## 2024-03-24 ENCOUNTER — Encounter (HOSPITAL_COMMUNITY): Payer: Self-pay | Admitting: Obstetrics and Gynecology

## 2024-03-24 ENCOUNTER — Encounter: Payer: Self-pay | Admitting: Neurology

## 2024-03-24 VITALS — BP 170/90 | HR 93 | Ht 71.0 in | Wt 356.0 lb

## 2024-03-24 DIAGNOSIS — E860 Dehydration: Secondary | ICD-10-CM | POA: Diagnosis present

## 2024-03-24 DIAGNOSIS — K219 Gastro-esophageal reflux disease without esophagitis: Secondary | ICD-10-CM | POA: Diagnosis present

## 2024-03-24 DIAGNOSIS — E210 Primary hyperparathyroidism: Secondary | ICD-10-CM | POA: Diagnosis present

## 2024-03-24 DIAGNOSIS — E66813 Obesity, class 3: Secondary | ICD-10-CM | POA: Diagnosis present

## 2024-03-24 DIAGNOSIS — O99353 Diseases of the nervous system complicating pregnancy, third trimester: Secondary | ICD-10-CM

## 2024-03-24 DIAGNOSIS — O99284 Endocrine, nutritional and metabolic diseases complicating childbirth: Secondary | ICD-10-CM | POA: Diagnosis present

## 2024-03-24 DIAGNOSIS — O9962 Diseases of the digestive system complicating childbirth: Secondary | ICD-10-CM | POA: Diagnosis present

## 2024-03-24 DIAGNOSIS — Z3A30 30 weeks gestation of pregnancy: Secondary | ICD-10-CM | POA: Diagnosis not present

## 2024-03-24 DIAGNOSIS — O1414 Severe pre-eclampsia complicating childbirth: Principal | ICD-10-CM | POA: Diagnosis present

## 2024-03-24 DIAGNOSIS — O09293 Supervision of pregnancy with other poor reproductive or obstetric history, third trimester: Secondary | ICD-10-CM | POA: Diagnosis not present

## 2024-03-24 DIAGNOSIS — Z880 Allergy status to penicillin: Secondary | ICD-10-CM

## 2024-03-24 DIAGNOSIS — O09523 Supervision of elderly multigravida, third trimester: Secondary | ICD-10-CM | POA: Diagnosis not present

## 2024-03-24 DIAGNOSIS — O99214 Obesity complicating childbirth: Secondary | ICD-10-CM | POA: Diagnosis present

## 2024-03-24 DIAGNOSIS — G43909 Migraine, unspecified, not intractable, without status migrainosus: Secondary | ICD-10-CM | POA: Diagnosis present

## 2024-03-24 DIAGNOSIS — O99892 Other specified diseases and conditions complicating childbirth: Secondary | ICD-10-CM | POA: Diagnosis present

## 2024-03-24 DIAGNOSIS — E0520 Thyrotoxicosis with toxic multinodular goiter without thyrotoxic crisis or storm: Secondary | ICD-10-CM | POA: Diagnosis present

## 2024-03-24 DIAGNOSIS — N179 Acute kidney failure, unspecified: Secondary | ICD-10-CM | POA: Diagnosis not present

## 2024-03-24 DIAGNOSIS — D509 Iron deficiency anemia, unspecified: Secondary | ICD-10-CM | POA: Diagnosis present

## 2024-03-24 DIAGNOSIS — O99354 Diseases of the nervous system complicating childbirth: Secondary | ICD-10-CM | POA: Diagnosis present

## 2024-03-24 DIAGNOSIS — O9902 Anemia complicating childbirth: Secondary | ICD-10-CM | POA: Diagnosis present

## 2024-03-24 DIAGNOSIS — G43009 Migraine without aura, not intractable, without status migrainosus: Secondary | ICD-10-CM | POA: Diagnosis not present

## 2024-03-24 DIAGNOSIS — E876 Hypokalemia: Principal | ICD-10-CM | POA: Diagnosis present

## 2024-03-24 DIAGNOSIS — R519 Headache, unspecified: Secondary | ICD-10-CM

## 2024-03-24 DIAGNOSIS — Z87442 Personal history of urinary calculi: Secondary | ICD-10-CM | POA: Diagnosis not present

## 2024-03-24 DIAGNOSIS — R030 Elevated blood-pressure reading, without diagnosis of hypertension: Secondary | ICD-10-CM | POA: Diagnosis present

## 2024-03-24 DIAGNOSIS — O329XX0 Maternal care for malpresentation of fetus, unspecified, not applicable or unspecified: Secondary | ICD-10-CM | POA: Diagnosis not present

## 2024-03-24 DIAGNOSIS — O133 Gestational [pregnancy-induced] hypertension without significant proteinuria, third trimester: Secondary | ICD-10-CM | POA: Diagnosis not present

## 2024-03-24 DIAGNOSIS — Z8249 Family history of ischemic heart disease and other diseases of the circulatory system: Secondary | ICD-10-CM

## 2024-03-24 DIAGNOSIS — O321XX0 Maternal care for breech presentation, not applicable or unspecified: Secondary | ICD-10-CM | POA: Diagnosis present

## 2024-03-24 DIAGNOSIS — O3663X0 Maternal care for excessive fetal growth, third trimester, not applicable or unspecified: Secondary | ICD-10-CM | POA: Diagnosis present

## 2024-03-24 DIAGNOSIS — D351 Benign neoplasm of parathyroid gland: Secondary | ICD-10-CM | POA: Diagnosis present

## 2024-03-24 DIAGNOSIS — I959 Hypotension, unspecified: Secondary | ICD-10-CM | POA: Diagnosis present

## 2024-03-24 DIAGNOSIS — Z23 Encounter for immunization: Secondary | ICD-10-CM | POA: Diagnosis not present

## 2024-03-24 DIAGNOSIS — O1493 Unspecified pre-eclampsia, third trimester: Secondary | ICD-10-CM

## 2024-03-24 DIAGNOSIS — Z3A34 34 weeks gestation of pregnancy: Secondary | ICD-10-CM | POA: Diagnosis not present

## 2024-03-24 DIAGNOSIS — O99213 Obesity complicating pregnancy, third trimester: Secondary | ICD-10-CM | POA: Diagnosis not present

## 2024-03-24 DIAGNOSIS — Z98891 History of uterine scar from previous surgery: Secondary | ICD-10-CM

## 2024-03-24 DIAGNOSIS — O34211 Maternal care for low transverse scar from previous cesarean delivery: Secondary | ICD-10-CM | POA: Diagnosis present

## 2024-03-24 DIAGNOSIS — E038 Other specified hypothyroidism: Secondary | ICD-10-CM | POA: Diagnosis not present

## 2024-03-24 DIAGNOSIS — E669 Obesity, unspecified: Secondary | ICD-10-CM | POA: Diagnosis not present

## 2024-03-24 LAB — TYPE AND SCREEN
ABO/RH(D): O POS
Antibody Screen: NEGATIVE

## 2024-03-24 LAB — CBC
HCT: 27.8 % — ABNORMAL LOW (ref 36.0–46.0)
Hemoglobin: 8.5 g/dL — ABNORMAL LOW (ref 12.0–15.0)
MCH: 23 pg — ABNORMAL LOW (ref 26.0–34.0)
MCHC: 30.6 g/dL (ref 30.0–36.0)
MCV: 75.3 fL — ABNORMAL LOW (ref 80.0–100.0)
Platelets: 253 10*3/uL (ref 150–400)
RBC: 3.69 MIL/uL — ABNORMAL LOW (ref 3.87–5.11)
RDW: 17.2 % — ABNORMAL HIGH (ref 11.5–15.5)
WBC: 6.4 10*3/uL (ref 4.0–10.5)
nRBC: 0 % (ref 0.0–0.2)

## 2024-03-24 LAB — COMPREHENSIVE METABOLIC PANEL WITH GFR
ALT: 19 U/L (ref 0–44)
AST: 28 U/L (ref 15–41)
Albumin: 2.4 g/dL — ABNORMAL LOW (ref 3.5–5.0)
Alkaline Phosphatase: 51 U/L (ref 38–126)
Anion gap: 7 (ref 5–15)
BUN: <5 mg/dL — ABNORMAL LOW (ref 6–20)
CO2: 25 mmol/L (ref 22–32)
Calcium: 9.8 mg/dL (ref 8.9–10.3)
Chloride: 104 mmol/L (ref 98–111)
Creatinine, Ser: 0.52 mg/dL (ref 0.44–1.00)
GFR, Estimated: >60 mL/min (ref >60)
Glucose, Bld: 98 mg/dL (ref 70–99)
Potassium: 2.4 mmol/L — CL (ref 3.5–5.1)
Sodium: 136 mmol/L (ref 135–145)
Total Bilirubin: 0.3 mg/dL (ref 0.0–1.2)
Total Protein: 5.5 g/dL — ABNORMAL LOW (ref 6.5–8.1)

## 2024-03-24 LAB — LACTATE DEHYDROGENASE: LDH: 143 U/L (ref 98–192)

## 2024-03-24 LAB — PROTEIN / CREATININE RATIO, URINE
Creatinine, Urine: 95 mg/dL
Protein Creatinine Ratio: 0.15 mg/mg{creat} (ref 0.00–0.15)
Total Protein, Urine: 14 mg/dL

## 2024-03-24 LAB — URIC ACID: Uric Acid, Serum: 4.9 mg/dL (ref 2.5–7.1)

## 2024-03-24 MED ORDER — LABETALOL HCL 200 MG PO TABS
200.0000 mg | ORAL_TABLET | Freq: Two times a day (BID) | ORAL | Status: DC
  Administered 2024-03-24 – 2024-03-25 (×2): 200 mg via ORAL
  Filled 2024-03-24 (×2): qty 1

## 2024-03-24 MED ORDER — CALCIUM CARBONATE ANTACID 500 MG PO CHEW
2.0000 | CHEWABLE_TABLET | ORAL | Status: DC | PRN
  Administered 2024-03-28 – 2024-04-06 (×4): 400 mg via ORAL
  Filled 2024-03-24 (×4): qty 2

## 2024-03-24 MED ORDER — LACTATED RINGERS IV SOLN: INTRAVENOUS | Status: AC

## 2024-03-24 MED ORDER — DOCUSATE SODIUM 100 MG PO CAPS
100.0000 mg | ORAL_CAPSULE | Freq: Every day | ORAL | Status: DC
  Administered 2024-03-25 – 2024-04-19 (×23): 100 mg via ORAL
  Filled 2024-03-24 (×26): qty 1

## 2024-03-24 MED ORDER — LACTATED RINGERS IV SOLN: 125.0000 mL/h | INTRAVENOUS | Status: AC

## 2024-03-24 MED ORDER — SODIUM CHLORIDE 0.9 % IV SOLN: INTRAVENOUS | Status: AC | PRN

## 2024-03-24 MED ORDER — LABETALOL HCL 5 MG/ML IV SOLN
20.0000 mg | INTRAVENOUS | Status: DC | PRN
  Administered 2024-03-24: 20 mg via INTRAVENOUS
  Filled 2024-03-24 (×2): qty 4

## 2024-03-24 MED ORDER — PRENATAL MULTIVITAMIN CH
1.0000 | ORAL_TABLET | Freq: Every day | ORAL | Status: DC
  Administered 2024-03-25 – 2024-04-19 (×24): 1 via ORAL
  Filled 2024-03-24 (×26): qty 1

## 2024-03-24 MED ORDER — ACETAMINOPHEN 325 MG PO TABS
650.0000 mg | ORAL_TABLET | ORAL | Status: DC | PRN
  Administered 2024-03-24 – 2024-03-29 (×5): 650 mg via ORAL
  Filled 2024-03-24 (×5): qty 2

## 2024-03-24 MED ORDER — BETAMETHASONE SOD PHOS & ACET 6 (3-3) MG/ML IJ SUSP
12.0000 mg | INTRAMUSCULAR | Status: AC
  Administered 2024-03-24 – 2024-03-25 (×2): 12 mg via INTRAMUSCULAR
  Filled 2024-03-24: qty 5

## 2024-03-24 MED ORDER — LABETALOL HCL 5 MG/ML IV SOLN
40.0000 mg | INTRAVENOUS | Status: DC | PRN
  Administered 2024-03-24: 40 mg via INTRAVENOUS
  Filled 2024-03-24: qty 8

## 2024-03-24 MED ORDER — NIFEDIPINE ER OSMOTIC RELEASE 30 MG PO TB24: 30.0000 mg | ORAL_TABLET | Freq: Every day | ORAL | Status: DC

## 2024-03-24 MED ORDER — HYDRALAZINE HCL 20 MG/ML IJ SOLN: 10.0000 mg | INTRAMUSCULAR | Status: DC | PRN

## 2024-03-24 MED ORDER — LABETALOL HCL 5 MG/ML IV SOLN: 80.0000 mg | INTRAVENOUS | Status: DC | PRN

## 2024-03-24 MED ORDER — POTASSIUM CHLORIDE 10 MEQ/100ML IV SOLN
10.0000 meq | INTRAVENOUS | Status: AC
  Administered 2024-03-24 (×4): 10 meq via INTRAVENOUS
  Filled 2024-03-24 (×4): qty 100

## 2024-03-24 NOTE — Progress Notes (Signed)
 CRITICAL VALUE STICKER  CRITICAL VALUE: Potassium 2.4  RECEIVER (on-site recipient of call):Alyssandra Hulsebus, RN  DATE & TIME NOTIFIED: 03/24/2024 1715  MESSENGER (representative from lab):  MD NOTIFIED: Dr Avanell Bob  TIME OF NOTIFICATION:1717  RESPONSE: New orders received

## 2024-03-24 NOTE — Plan of Care (Signed)
   Problem: Education: Goal: Knowledge of General Education information will improve Description: Including pain rating scale, medication(s)/side effects and non-pharmacologic comfort measures Outcome: Completed/Met

## 2024-03-24 NOTE — Patient Instructions (Addendum)
 Check MRI/MRA of head.We have sent a referral to [facility] for your MRI and they will call you directly to schedule your appointment. They are located at [address] [facility]. If you need to contact them directly please call [phone].  Continue Tylenol  as needed but Limit use of pain relievers to no more than 9 days out of the month to prevent risk of rebound or medication-overuse headache. Follow up with OBGYN regarding blood pressure Follow up with  me one month after giving birth (14 weeks)

## 2024-03-24 NOTE — H&P (Signed)
 Beth [name] is a 38 y.o. female presenting for elevated blood pressures  38 year old G5 P3-0-1-3 presents at 30+1 weeks after being seen in the office for elevated blood pressures.  Patient was seen by neurologist earlier this morning for a history of headaches throughout this pregnancy.  During that office visit she was noted to have elevated blood pressures.  She was seen in her usual prenatal office this morning and blood pressures were confirmed to be elevated at 162/100 and 158/98.  The patient has a history of gestational hypertension with prior pregnancies.  Her blood pressures have been in the upper limits of normal throughout this pregnancy.  At 11 weeks she did have a diastolic blood pressure of 90.  Of note she has no symptoms of headache today.  Her pregnancy has been complicated by advanced maternal age, infertility with IVF pregnancy, maternal morbid obesity with an initial BMI of 45, anemia, and history of cesarean delivery.  Pregnancy problems: 1) advanced maternal age, 2) maternal obesity: Initial BMI 45 3) IVF pregnancy: CFI transfer:  IVF Single untested FET 09/14/23, [facility] EDD 06/01/2024 - NIPT low risk female 4) history of cesarean delivery: G1 primary section for macrosomia, EFW 10 pounds.  Successful VBAC with G2, desires VBAC 5) history of gestational hypertension: G2 6) history of postpartum hemorrhage: G2 7) maternal anemia: Hemoglobin 9.1 at 28 weeks OB History     Gravida  4   Para  3   Term  3   Preterm      AB      Living  3      SAB      IAB      Ectopic      Multiple  0   Live Births  3          Past Medical History:  Diagnosis Date   Anemia    Anxiety    Bulging lumbar disc    Headache    otc med prn - last one 4 months ago   History of blood transfusion    after 04/02/2016 svd at [facility]   Seizures (HCC)    Childhood   SVD (spontaneous vaginal delivery)    x 1   Past Surgical History:  Procedure Laterality Date   CESAREAN SECTION N/A  05/26/2017   Procedure: CESAREAN SECTION;  Surgeon: Artemisa Bile, MD;  Location: [facility] BIRTHING SUITES;  Service: Obstetrics;  Laterality: N/A;   DILATION AND CURETTAGE OF UTERUS N/A 06/12/2016   Procedure: DILATATION AND CURETTAGE;  Surgeon: Artemisa Bile, MD;  Location: [facility] ORS;  Service: Gynecology;  Laterality: N/A;   TONSILLECTOMY     WISDOM TOOTH EXTRACTION     Family History: family history includes Cancer in an other family member; Dementia in her mother; Hypertension in an other family member. Social History:  reports that she has never smoked. She has never used smokeless tobacco. She reports that she does not drink alcohol and does not use drugs.     Maternal Diabetes: No Genetic Screening: Normal Maternal Ultrasounds/Referrals: Normal Fetal Ultrasounds or other Referrals:  None Maternal Substance Abuse:  No Significant Maternal Medications:  None Significant Maternal Lab Results:  None Number of Prenatal Visits:greater than 3 verified prenatal visits  Other Comments:  None  Review of Systems History   Temperature 97.8 F (36.6 C), temperature source Oral, resp. rate 20, SpO2 99%, unknown if currently breastfeeding. Exam Physical Exam  Alert and oriented x 3, no apparent distress Obese, gravid, soft Fetal heart  rate 130, reactive, category 1 tracing Cervix deferred Toco quiet Prenatal labs: ABO, Rh:  O+ Antibody:  Negative Rubella:  Immune RPR:   Nonreactive HBsAg:   Negative HIV:   Reactive GBS:   Not yet performed  Assessment/Plan: 1) admit to antepartum 2) continuous fetal monitoring 3) check PIH labs, labetalol  per protocol for severe range blood pressures.  Patient is asymptomatic at this time 4) betamethasone for fetal lung maturity 5) consider magnesium  sulfate for neuroprophylaxis and/or seizure prophylaxis. 6) NICU consultation 7) MFM consultation 8) SCDs for DVT prophylaxis 9) check GBS culture    [name] 03/24/2024, 2:29 PM

## 2024-03-25 ENCOUNTER — Inpatient Hospital Stay (HOSPITAL_COMMUNITY)

## 2024-03-25 DIAGNOSIS — Z3A30 30 weeks gestation of pregnancy: Secondary | ICD-10-CM

## 2024-03-25 DIAGNOSIS — O09523 Supervision of elderly multigravida, third trimester: Secondary | ICD-10-CM

## 2024-03-25 DIAGNOSIS — O09293 Supervision of pregnancy with other poor reproductive or obstetric history, third trimester: Secondary | ICD-10-CM

## 2024-03-25 DIAGNOSIS — O133 Gestational [pregnancy-induced] hypertension without significant proteinuria, third trimester: Secondary | ICD-10-CM

## 2024-03-25 DIAGNOSIS — E669 Obesity, unspecified: Secondary | ICD-10-CM | POA: Diagnosis not present

## 2024-03-25 DIAGNOSIS — O99213 Obesity complicating pregnancy, third trimester: Secondary | ICD-10-CM

## 2024-03-25 LAB — CBC
HCT: 27 % — ABNORMAL LOW (ref 36.0–46.0)
Hemoglobin: 8.4 g/dL — ABNORMAL LOW (ref 12.0–15.0)
MCH: 23.5 pg — ABNORMAL LOW (ref 26.0–34.0)
MCHC: 31.1 g/dL (ref 30.0–36.0)
MCV: 75.6 fL — ABNORMAL LOW (ref 80.0–100.0)
Platelets: 255 10*3/uL (ref 150–400)
RBC: 3.57 MIL/uL — ABNORMAL LOW (ref 3.87–5.11)
RDW: 17.1 % — ABNORMAL HIGH (ref 11.5–15.5)
WBC: 8.1 10*3/uL (ref 4.0–10.5)
nRBC: 0 % (ref 0.0–0.2)

## 2024-03-25 LAB — COMPREHENSIVE METABOLIC PANEL WITH GFR
ALT: 18 U/L (ref 0–44)
AST: 23 U/L (ref 15–41)
Albumin: 2.3 g/dL — ABNORMAL LOW (ref 3.5–5.0)
Alkaline Phosphatase: 49 U/L (ref 38–126)
Anion gap: 7 (ref 5–15)
BUN: <5 mg/dL — ABNORMAL LOW (ref 6–20)
CO2: 22 mmol/L (ref 22–32)
Calcium: 9.8 mg/dL (ref 8.9–10.3)
Chloride: 108 mmol/L (ref 98–111)
Creatinine, Ser: 0.5 mg/dL (ref 0.44–1.00)
GFR, Estimated: >60 mL/min (ref >60)
Glucose, Bld: 132 mg/dL — ABNORMAL HIGH (ref 70–99)
Potassium: 2.6 mmol/L — CL (ref 3.5–5.1)
Sodium: 137 mmol/L (ref 135–145)
Total Bilirubin: 0.4 mg/dL (ref 0.0–1.2)
Total Protein: 5.3 g/dL — ABNORMAL LOW (ref 6.5–8.1)

## 2024-03-25 LAB — MAGNESIUM: Magnesium: 1.6 mg/dL — ABNORMAL LOW (ref 1.7–2.4)

## 2024-03-25 LAB — FERRITIN: Ferritin: 6 ng/mL — ABNORMAL LOW (ref 11–307)

## 2024-03-25 MED ORDER — ALBUTEROL SULFATE (2.5 MG/3ML) 0.083% IN NEBU: 2.5000 mg | INHALATION_SOLUTION | Freq: Once | RESPIRATORY_TRACT | Status: DC | PRN

## 2024-03-25 MED ORDER — LABETALOL HCL 5 MG/ML IV SOLN: 80.0000 mg | INTRAVENOUS | Status: DC | PRN

## 2024-03-25 MED ORDER — LABETALOL HCL 200 MG PO TABS
400.0000 mg | ORAL_TABLET | Freq: Two times a day (BID) | ORAL | Status: DC
  Administered 2024-03-25 – 2024-03-27 (×4): 400 mg via ORAL
  Filled 2024-03-25 (×4): qty 2

## 2024-03-25 MED ORDER — SODIUM CHLORIDE 0.9 % IV SOLN
500.0000 mg | Freq: Once | INTRAVENOUS | Status: AC
  Administered 2024-03-25: 500 mg via INTRAVENOUS
  Filled 2024-03-25: qty 25

## 2024-03-25 MED ORDER — EPINEPHRINE 0.3 MG/0.3ML IJ SOAJ
0.3000 mg | Freq: Once | INTRAMUSCULAR | Status: DC | PRN
Start: 2024-03-25 — End: 2024-04-04

## 2024-03-25 MED ORDER — METHYLPREDNISOLONE SODIUM SUCC 125 MG IJ SOLR: 125.0000 mg | Freq: Once | INTRAMUSCULAR | Status: DC | PRN

## 2024-03-25 MED ORDER — LABETALOL HCL 5 MG/ML IV SOLN
20.0000 mg | INTRAVENOUS | Status: DC | PRN
  Administered 2024-03-25: 20 mg via INTRAVENOUS

## 2024-03-25 MED ORDER — SODIUM CHLORIDE 0.9 % IV BOLUS
500.0000 mL | Freq: Once | INTRAVENOUS | Status: DC | PRN
Start: 2024-03-25 — End: 2024-04-04

## 2024-03-25 MED ORDER — SODIUM CHLORIDE 0.9 % IV SOLN
INTRAVENOUS | Status: AC | PRN
  Administered 2024-03-25 (×2): 10 mL via INTRAVENOUS

## 2024-03-25 MED ORDER — MAGNESIUM SULFATE BOLUS VIA INFUSION
4.0000 g | Freq: Once | INTRAVENOUS | Status: AC
  Administered 2024-03-25: 4 g via INTRAVENOUS
  Filled 2024-03-25: qty 1000

## 2024-03-25 MED ORDER — MAGNESIUM SULFATE 40 GM/1000ML IV SOLN
2.0000 g/h | INTRAVENOUS | Status: AC
  Administered 2024-03-25 – 2024-03-26 (×3): 2 g/h via INTRAVENOUS
  Filled 2024-03-25 (×2): qty 1000

## 2024-03-25 MED ORDER — DIPHENHYDRAMINE HCL 50 MG/ML IJ SOLN
25.0000 mg | Freq: Once | INTRAMUSCULAR | Status: DC | PRN
Start: 2024-03-25 — End: 2024-04-04
  Filled 2024-03-25: qty 1

## 2024-03-25 MED ORDER — HYDRALAZINE HCL 20 MG/ML IJ SOLN: 10.0000 mg | INTRAMUSCULAR | Status: DC | PRN

## 2024-03-25 MED ORDER — POTASSIUM CHLORIDE 10 MEQ/100ML IV SOLN
10.0000 meq | INTRAVENOUS | Status: AC
  Administered 2024-03-25 (×4): 10 meq via INTRAVENOUS
  Filled 2024-03-25 (×4): qty 100

## 2024-03-25 MED ORDER — LACTATED RINGERS IV SOLN
INTRAVENOUS | Status: AC
Start: 2024-03-25 — End: 2024-03-26

## 2024-03-25 MED ORDER — MAGNESIUM OXIDE -MG SUPPLEMENT 400 (240 MG) MG PO TABS
400.0000 mg | ORAL_TABLET | Freq: Every day | ORAL | Status: DC
  Administered 2024-03-25 – 2024-03-30 (×5): 400 mg via ORAL
  Filled 2024-03-25 (×6): qty 1

## 2024-03-25 MED ORDER — LABETALOL HCL 5 MG/ML IV SOLN: 40.0000 mg | INTRAVENOUS | Status: DC | PRN

## 2024-03-25 NOTE — Consult Note (Addendum)
 MFM Consult Note  Beth [name] is a 38 year old gravida 4 para 3 currently at 30 weeks and 2 days.  She was admitted yesterday due to a persistent headache and severe range blood pressures that required IV push labetalol  for blood pressure control.  The patient was seen by neurology who believes that her headache is most likely due to a change in her migraine.  She has a MRI/MRA of the head pending to evaluate for aneurysm or secondary intracranial etiology.  The patient reports that she was diagnosed with gestational hypertension in both of her prior pregnancies.  Overnight, the patient was started on labetalol  200 mg twice a day for blood pressure control.  She is also receiving a complete course of antenatal corticosteroids.   Her PIH labs on admission were all within normal limits.  Her P/C ratio (0.15) did not indicate significant proteinuria and therefore indicating that she most likely does not have preeclampsia at this time.  The patient reports that her severe headache resolved overnight.  She is feeling fine now.  An ultrasound performed today shows that the fetus is in the transverse/breech presentation.    The overall EFW of 4 pounds 12 ounces measures at greater than the 99th percentile for her gestational age.    There was normal amniotic fluid noted with a total AFI of 15.5 cm.    The views of the fetal anatomy were limited today due to her advanced gestational age.  The patient was advised that we will await the results of her MRI/MRA. She may get the MRI/MRA performed as an outpatient if it cannot be performed while she is hospitalized.  She should be observed in the hospital for another day or two to ensure that her blood pressures are mostly in the 140 over 90s range or lower.  The patient was advised that she most likely has developed gestational hypertension again in her current pregnancy.  Should she remain asymptomatic, she may be discharged home with twice weekly  fetal testing and blood pressure checks in your office.  She may continue labetalol  to help with the management of her blood pressures.  The goal for her delivery would be at around 37 weeks.  The patient was advised that although she probably does not have preeclampsia at this time, due to gestational hypertension, she is still at risk for developing preeclampsia later in her pregnancy.    Preeclampsia precautions were reviewed today.  She should have another growth scan performed in 4 weeks.    The patient stated that all of her questions were answered today.

## 2024-03-25 NOTE — Progress Notes (Signed)
 Beth [name] 38 y.o. [phone] at [redacted]w[redacted]d HD#2 admitted with severe range blood pressures  S: Feeling well this morning, no headache/VC/RUQ pain. Active FM. No contractions/LOF/VB.    O: Vitals:   03/24/24 1948 03/25/24 0005 03/25/24 0420 03/25/24 0741  BP: (!) 143/78 108/64 128/77 (!) 149/91  Pulse: 82 96 94 89  Resp:  19 19 18   Temp:  97.9 F (36.6 C) 97.9 F (36.6 C) 97.9 F (36.6 C)  TempSrc:  Oral Oral Oral  SpO2: 99% 98% 99%   Weight:      Height:       Gen: well appearing CVS: normal pulses Lungs: nonlabored respirations Abd: gravid Ext: no calf edema or tenderness  NST: 120bpm, mod variability, + accels, no decels Toco: quiet     Latest Ref Rng & Units 03/25/2024    5:47 AM 03/24/2024    4:13 PM 05/19/2021    5:21 AM  CBC  WBC 4.0 - 10.5 K/uL 8.1  6.4  9.0   Hemoglobin 12.0 - 15.0 g/dL 8.4  8.5  9.9   Hematocrit 36.0 - 46.0 % 27.0  27.8  29.4   Platelets 150 - 400 K/uL 255  253  216       Latest Ref Rng & Units 03/25/2024    5:47 AM 03/24/2024    4:13 PM 05/16/2021    2:51 PM  CMP  Glucose 70 - 99 mg/dL 454  98  82   BUN 6 - 20 mg/dL <5  <5  5   Creatinine 0.44 - 1.00 mg/dL 0.98  1.19  1.47   Sodium 135 - 145 mmol/L 137  136  137   Potassium 3.5 - 5.1 mmol/L 2.6  2.4  3.5   Chloride 98 - 111 mmol/L 108  104  107   CO2 22 - 32 mmol/L 22  25  22    Calcium  8.9 - 10.3 mg/dL 9.8  9.8  82.9   Total Protein 6.5 - 8.1 g/dL 5.3  5.5  6.0   Total Bilirubin 0.0 - 1.2 mg/dL 0.4  0.3  0.8   Alkaline Phos 38 - 126 U/L 49  51  122   AST 15 - 41 U/L 23  28  17    ALT 0 - 44 U/L 18  19  11      Latest Reference Range & Units 03/24/24 15:57  Protein Creatinine Ratio 0.00 - 0.15 mg/mgCre 0.15     A/p: Beth [name] 37 y.o. G4P3003 at [redacted]w[redacted]d HD#2 admitted with severe range BPs Severe range blood pressures: required IV labetalol  upon admission, now normal to mild range blood pressures on PO labetalol  200mg  BID. Titrate PRN. Preeclampsia labs normal, urine p/c 0.15. Did not  receive IV magnesium  on admission. She is asymptomatic. MFM consult requested, spoke with Dr. Grayland Le, will obtain growth US  and then he will make formal recommendations.  Preterm: betamethasone  course started yesterday, second dose today at 1515. NICU consult requested.  Hypokalemia, unclear etiology: IV repletion ordered, repeat CMP tomorrow Iron  deficiency anemia: hgb 8.4, ferritin 6. OB provider had requested IV iron  as outpatient, will arrange to give first dose today.  NST q shift  Theodoro Fisherman 03/25/24 10:59 AM

## 2024-03-26 ENCOUNTER — Inpatient Hospital Stay (HOSPITAL_COMMUNITY)

## 2024-03-26 LAB — COMPREHENSIVE METABOLIC PANEL WITH GFR
ALT: 19 U/L (ref 0–44)
AST: 29 U/L (ref 15–41)
Albumin: 2.5 g/dL — ABNORMAL LOW (ref 3.5–5.0)
Alkaline Phosphatase: 52 U/L (ref 38–126)
Anion gap: 7 (ref 5–15)
BUN: <5 mg/dL — ABNORMAL LOW (ref 6–20)
CO2: 24 mmol/L (ref 22–32)
Calcium: 9.9 mg/dL (ref 8.9–10.3)
Chloride: 109 mmol/L (ref 98–111)
Creatinine, Ser: 0.68 mg/dL (ref 0.44–1.00)
GFR, Estimated: >60 mL/min (ref >60)
Glucose, Bld: 150 mg/dL — ABNORMAL HIGH (ref 70–99)
Potassium: 2.5 mmol/L — CL (ref 3.5–5.1)
Sodium: 140 mmol/L (ref 135–145)
Total Bilirubin: 0.6 mg/dL (ref 0.0–1.2)
Total Protein: 5.9 g/dL — ABNORMAL LOW (ref 6.5–8.1)

## 2024-03-26 LAB — NA AND K (SODIUM & POTASSIUM), RAND UR
Potassium Urine: 12 mmol/L
Sodium, Ur: 49 mmol/L

## 2024-03-26 LAB — CBC
HCT: 27.4 % — ABNORMAL LOW (ref 36.0–46.0)
Hemoglobin: 8.4 g/dL — ABNORMAL LOW (ref 12.0–15.0)
MCH: 23.5 pg — ABNORMAL LOW (ref 26.0–34.0)
MCHC: 30.7 g/dL (ref 30.0–36.0)
MCV: 76.5 fL — ABNORMAL LOW (ref 80.0–100.0)
Platelets: 285 10*3/uL (ref 150–400)
RBC: 3.58 MIL/uL — ABNORMAL LOW (ref 3.87–5.11)
RDW: 17.7 % — ABNORMAL HIGH (ref 11.5–15.5)
WBC: 10.9 10*3/uL — ABNORMAL HIGH (ref 4.0–10.5)
nRBC: 0.2 % (ref 0.0–0.2)

## 2024-03-26 LAB — CULTURE, BETA STREP (GROUP B ONLY)

## 2024-03-26 LAB — MAGNESIUM: Magnesium: 3.5 mg/dL — ABNORMAL HIGH (ref 1.7–2.4)

## 2024-03-26 MED ORDER — POTASSIUM CHLORIDE 10 MEQ/100ML IV SOLN
10.0000 meq | INTRAVENOUS | Status: AC
  Administered 2024-03-26 (×4): 10 meq via INTRAVENOUS
  Filled 2024-03-26 (×4): qty 100

## 2024-03-26 MED ORDER — BUTALBITAL-APAP-CAFFEINE 50-325-40 MG PO TABS
1.0000 | ORAL_TABLET | Freq: Four times a day (QID) | ORAL | Status: DC | PRN
  Administered 2024-03-26 – 2024-04-01 (×4): 1 via ORAL
  Filled 2024-03-26 (×5): qty 1

## 2024-03-26 MED ORDER — OXYCODONE HCL 5 MG PO TABS
5.0000 mg | ORAL_TABLET | Freq: Once | ORAL | Status: AC
  Administered 2024-03-26: 5 mg via ORAL
  Filled 2024-03-26: qty 1

## 2024-03-26 NOTE — Progress Notes (Signed)
 Late Entry Note  Called by RN for persistent severe range blood pressures from 1941-2109. Was given her PO labetalol  400mg  dose at 2015, then IV labetalol  20mg  at 2118.  166/95, 179/103, 150/79, 162/82, 160/90, 161/91  After IV labetalol , BP 129-148/70-91.   Given persistent severe range Bps and need for IV antihypertensives, she was started on IV magnesium  at 2226, will complete 24 hours for seizure prophylaxis. Will switch back to continuous fetal monitoring while on magnesium .   Repeat labs ordered for AM. MRI/MRA (per neurologist orders) ordered to be completed while inpatient.   [name] MD 03/26/24 3:02 AM

## 2024-03-26 NOTE — Progress Notes (Addendum)
 Patient ID: Beth [name], female   DOB: Dec 20, 1985, 38 y.o.   MRN: [id number]  Beth [name] 38 y.o. [id number] at [redacted]w[redacted]d HD#3 admitted with severe range blood pressures   S: Magnesium  started overnight due to persistent severe range BP. Feeling down today due to clinical progress. Normal fetal movement. Denies CTX, LOF, HA.      O: Vitals:   03/26/24 0406 03/26/24 0515 03/26/24 0620 03/26/24 0802  BP: 137/75 (!) 159/96 (!) 143/60 (!) 149/94  Pulse: 93 88 93 97  Resp: 16 18 16 18   Temp:    97.8 F (36.6 C)  TempSrc:    Oral  SpO2:    98%  Weight:      Height:          Gen: obese, NAD Lungs: nonlabored respirations Abd: gravid Ext: no calf edema or tenderness   NST: Broken tracing. 130bpm, mod variability, + accels, no decels Toco: quiet  Results for orders placed or performed during the hospital encounter of 03/24/24 (from the past 24 hours)  CBC     Status: Abnormal   Collection Time: 03/26/24  5:37 AM  Result Value Ref Range   WBC 10.9 (H) 4.0 - 10.5 K/uL   RBC 3.58 (L) 3.87 - 5.11 MIL/uL   Hemoglobin 8.4 (L) 12.0 - 15.0 g/dL   HCT 78.4 (L) 69.6 - 29.5 %   MCV 76.5 (L) 80.0 - 100.0 fL   MCH 23.5 (L) 26.0 - 34.0 pg   MCHC 30.7 30.0 - 36.0 g/dL   RDW 28.4 (H) 13.2 - 44.0 %   Platelets 285 150 - 400 K/uL   nRBC 0.2 0.0 - 0.2 %  Comprehensive metabolic panel     Status: Abnormal   Collection Time: 03/26/24  5:37 AM  Result Value Ref Range   Sodium 140 135 - 145 mmol/L   Potassium 2.5 (LL) 3.5 - 5.1 mmol/L   Chloride 109 98 - 111 mmol/L   CO2 24 22 - 32 mmol/L   Glucose, Bld 150 (H) 70 - 99 mg/dL   BUN <5 (L) 6 - 20 mg/dL   Creatinine, Ser 1.02 0.44 - 1.00 mg/dL   Calcium  9.9 8.9 - 10.3 mg/dL   Total Protein 5.9 (L) 6.5 - 8.1 g/dL   Albumin 2.5 (L) 3.5 - 5.0 g/dL   AST 29 15 - 41 U/L   ALT 19 0 - 44 U/L   Alkaline Phosphatase 52 38 - 126 U/L   Total Bilirubin 0.6 0.0 - 1.2 mg/dL   GFR, Estimated >72 >53 mL/min   Anion gap 7 5 - 15  Magnesium      Status: Abnormal    Collection Time: 03/26/24  5:37 AM  Result Value Ref Range   Magnesium  3.5 (H) 1.7 - 2.4 mg/dL      A/p: Geneal Evelyn 37 y.o. G4P3003 at [redacted]w[redacted]d HD#3 admitted with severe range BPs Severe range blood pressures: Labetalol  up-titrated to 400 BID. Required IV pushes overnight. On magnesium  sulfate, plan to DC tonight. She is asymptomatic. Discussed case with Dr. Grayland Le: recommends nifedipine  but patient reports severe HA with use in past. Will continue Labetalol  at current dose and titrate PRN. 24 hour urine Preterm: s/p BMZ x 2. Neonatology team to see patient today Hypokalemia, unclear etiology: Refractory to repletion so far. Magnesium  currently being given. Will also obtain urine K+ to rule out urinary losses. Continue to replete per protocol Iron  deficiency anemia: hgb 8.4, ferritin 6. IV iron  yesterday.  HA: Was unable to  complete MRI last night due to metal in hair pieces, deferred to outpatient. Currently no HA cEFM as able while on magnesium 

## 2024-03-27 DIAGNOSIS — E876 Hypokalemia: Secondary | ICD-10-CM | POA: Diagnosis not present

## 2024-03-27 LAB — CBC WITH DIFFERENTIAL/PLATELET
Abs Immature Granulocytes: 0.33 10*3/uL — ABNORMAL HIGH (ref 0.00–0.07)
Basophils Absolute: 0 10*3/uL (ref 0.0–0.1)
Basophils Relative: 1 %
Eosinophils Absolute: 0.1 10*3/uL (ref 0.0–0.5)
Eosinophils Relative: 1 %
HCT: 26 % — ABNORMAL LOW (ref 36.0–46.0)
Hemoglobin: 8 g/dL — ABNORMAL LOW (ref 12.0–15.0)
Immature Granulocytes: 4 %
Lymphocytes Relative: 19 %
Lymphs Abs: 1.5 10*3/uL (ref 0.7–4.0)
MCH: 23.5 pg — ABNORMAL LOW (ref 26.0–34.0)
MCHC: 30.8 g/dL (ref 30.0–36.0)
MCV: 76.5 fL — ABNORMAL LOW (ref 80.0–100.0)
Monocytes Absolute: 0.8 10*3/uL (ref 0.1–1.0)
Monocytes Relative: 11 %
Neutro Abs: 4.9 10*3/uL (ref 1.7–7.7)
Neutrophils Relative %: 64 %
Platelets: 247 10*3/uL (ref 150–400)
RBC: 3.4 MIL/uL — ABNORMAL LOW (ref 3.87–5.11)
RDW: 18 % — ABNORMAL HIGH (ref 11.5–15.5)
WBC: 7.6 10*3/uL (ref 4.0–10.5)
nRBC: 0.3 % — ABNORMAL HIGH (ref 0.0–0.2)

## 2024-03-27 LAB — COMPREHENSIVE METABOLIC PANEL WITH GFR
ALT: 17 U/L (ref 0–44)
AST: 21 U/L (ref 15–41)
Albumin: 2.4 g/dL — ABNORMAL LOW (ref 3.5–5.0)
Alkaline Phosphatase: 52 U/L (ref 38–126)
Anion gap: 8 (ref 5–15)
BUN: <5 mg/dL — ABNORMAL LOW (ref 6–20)
CO2: 26 mmol/L (ref 22–32)
Calcium: 10.3 mg/dL (ref 8.9–10.3)
Chloride: 106 mmol/L (ref 98–111)
Creatinine, Ser: 0.61 mg/dL (ref 0.44–1.00)
GFR, Estimated: >60 mL/min (ref >60)
Glucose, Bld: 89 mg/dL (ref 70–99)
Potassium: 2.2 mmol/L — CL (ref 3.5–5.1)
Sodium: 140 mmol/L (ref 135–145)
Total Bilirubin: 0.5 mg/dL (ref 0.0–1.2)
Total Protein: 5.4 g/dL — ABNORMAL LOW (ref 6.5–8.1)

## 2024-03-27 LAB — PROTEIN, URINE, 24 HOUR
Collection Interval-UPROT: 24 h
Protein, 24H Urine: 372 mg/d — ABNORMAL HIGH (ref 50–100)
Protein, Urine: 6 mg/dL
Urine Total Volume-UPROT: 6200 mL

## 2024-03-27 LAB — MAGNESIUM: Magnesium: 2.4 mg/dL (ref 1.7–2.4)

## 2024-03-27 MED ORDER — POTASSIUM CHLORIDE CRYS ER 20 MEQ PO TBCR
40.0000 meq | EXTENDED_RELEASE_TABLET | Freq: Three times a day (TID) | ORAL | Status: DC
  Administered 2024-03-27 (×2): 40 meq via ORAL
  Filled 2024-03-27 (×2): qty 2

## 2024-03-27 MED ORDER — SODIUM CHLORIDE 0.9 % IV SOLN: INTRAVENOUS | Status: DC | PRN

## 2024-03-27 MED ORDER — POTASSIUM CHLORIDE 10 MEQ/100ML IV SOLN
10.0000 meq | INTRAVENOUS | Status: AC
  Administered 2024-03-27 (×4): 10 meq via INTRAVENOUS
  Filled 2024-03-27 (×4): qty 100

## 2024-03-27 MED ORDER — DIPHENHYDRAMINE HCL 50 MG/ML IJ SOLN: 25.0000 mg | Freq: Once | INTRAMUSCULAR | Status: DC | PRN

## 2024-03-27 MED ORDER — LABETALOL HCL 200 MG PO TABS
400.0000 mg | ORAL_TABLET | Freq: Three times a day (TID) | ORAL | Status: DC
  Administered 2024-03-27 – 2024-03-28 (×3): 400 mg via ORAL
  Filled 2024-03-27 (×3): qty 2

## 2024-03-27 MED ORDER — FERROUS SULFATE 325 (65 FE) MG PO TABS
325.0000 mg | ORAL_TABLET | ORAL | Status: DC
  Administered 2024-03-27 – 2024-04-18 (×11): 325 mg via ORAL
  Filled 2024-03-27 (×12): qty 1

## 2024-03-27 MED ORDER — IRON SUCROSE 500 MG IVPB - SIMPLE MED: 500.0000 mg | INTRAVENOUS | Status: DC

## 2024-03-27 MED ORDER — SODIUM CHLORIDE 0.9 % IV BOLUS
500.0000 mL | Freq: Once | INTRAVENOUS | Status: DC | PRN
Start: 2024-03-27 — End: 2024-04-04

## 2024-03-27 NOTE — Progress Notes (Addendum)
 Patient ID: Beth [name], female   DOB: [birth year], 38 y.o.   MRN: [id number]  Beth [name] 38 y.o. [id number] at [redacted]w[redacted]d HD#4 admitted with severe range blood pressures   S: Magnesium  completed overnight. HA refractory to tylenol  during day, fioricet  at night helped   O: Vitals:   03/26/24 1947 03/26/24 2205 03/27/24 0337 03/27/24 0810  BP: 129/77 124/69 136/78 (!) 140/77  Pulse: 70 72 80 81  Resp: 18 17 18 17   Temp: 97.8 F (36.6 C)  97.8 F (36.6 C) 98.1 F (36.7 C)  TempSrc: Oral  Oral Oral  SpO2: 99% 98% 98% 99%  Weight:      Height:           Gen: obese, NAD Lungs: nonlabored respirations Abd: gravid, soft, non-tender Ext: no calf edema or tenderness   NST: Significantly broken tracing 2/2 habitus. 120bpm, mod variability, no significant decelerations visualized Toco: quiet   Results for orders placed or performed during the hospital encounter of 03/24/24 (from the past 24 hours)  Na and K (sodium & potassium), rand urine     Status: None   Collection Time: 03/26/24  9:49 AM  Result Value Ref Range   Sodium, Ur 49 mmol/L   Potassium Urine 12 mmol/L  CBC with Differential/Platelet     Status: Abnormal   Collection Time: 03/27/24  4:31 AM  Result Value Ref Range   WBC 7.6 4.0 - 10.5 K/uL   RBC 3.40 (L) 3.87 - 5.11 MIL/uL   Hemoglobin 8.0 (L) 12.0 - 15.0 g/dL   HCT 32.4 (L) 40.1 - 02.7 %   MCV 76.5 (L) 80.0 - 100.0 fL   MCH 23.5 (L) 26.0 - 34.0 pg   MCHC 30.8 30.0 - 36.0 g/dL   RDW 25.3 (H) 66.4 - 40.3 %   Platelets 247 150 - 400 K/uL   nRBC 0.3 (H) 0.0 - 0.2 %   Neutrophils Relative % 64 %   Neutro Abs 4.9 1.7 - 7.7 K/uL   Lymphocytes Relative 19 %   Lymphs Abs 1.5 0.7 - 4.0 K/uL   Monocytes Relative 11 %   Monocytes Absolute 0.8 0.1 - 1.0 K/uL   Eosinophils Relative 1 %   Eosinophils Absolute 0.1 0.0 - 0.5 K/uL   Basophils Relative 1 %   Basophils Absolute 0.0 0.0 - 0.1 K/uL   Immature Granulocytes 4 %   Abs Immature Granulocytes 0.33 (H) 0.00 - 0.07 K/uL   Comprehensive metabolic panel     Status: Abnormal   Collection Time: 03/27/24  4:31 AM  Result Value Ref Range   Sodium 140 135 - 145 mmol/L   Potassium 2.2 (LL) 3.5 - 5.1 mmol/L   Chloride 106 98 - 111 mmol/L   CO2 26 22 - 32 mmol/L   Glucose, Bld 89 70 - 99 mg/dL   BUN <5 (L) 6 - 20 mg/dL   Creatinine, Ser 4.74 0.44 - 1.00 mg/dL   Calcium  10.3 8.9 - 10.3 mg/dL   Total Protein 5.4 (L) 6.5 - 8.1 g/dL   Albumin 2.4 (L) 3.5 - 5.0 g/dL   AST 21 15 - 41 U/L   ALT 17 0 - 44 U/L   Alkaline Phosphatase 52 38 - 126 U/L   Total Bilirubin 0.5 0.0 - 1.2 mg/dL   GFR, Estimated >25 >95 mL/min   Anion gap 8 5 - 15  Magnesium      Status: None   Collection Time: 03/27/24  4:31 AM  Result Value Ref Range  Magnesium  2.4 1.7 - 2.4 mg/dL         A/p: Beth [name] 37 y.o. G4P3003 at [redacted]w[redacted]d HD#4 admitted with severe range BPs PreE w/ SF: s/p magnesium  sulfate, completed overnight. BP mild range on Labetalol  400 BID. 24 hour urine 372 Preterm: s/p BMZ x 2. Neonatology team unable to see patient 5/31, will see today  Hypokalemia, unclear etiology: Refractory to repletion so far. Magnesium  level normal, urine K normal. No emesis/diarrhea. IM consulted, appreciate additional recommendations. Patient states her uncle had a potassium disorder which resulted in heart failure so she has been worried that levels remain low.  Iron  deficiency anemia: hgb 8.4, ferritin 6. IV iron  5/30. Start PO iron , too soon for IV HA: MRI completed yesterday, normal. Fioricet /tylenol  PRN, reviewed risk of rebound with fioricet , use sparingly NST qshift

## 2024-03-27 NOTE — Consult Note (Addendum)
 Neonatology Consult  Note:  At the request of the patients obstetrician Dr. Jeneane Miracle I met with [name] who is a 38 y.o. G4P3003 at [redacted]w[redacted]d HD#4 admitted with severe range blood pressures.  Pregnancy complicated by:  1) advanced maternal age, 2) maternal obesity: Initial BMI 45 3) IVF pregnancy: CFI transfer:  IVF Single untested FET 09/14/23, [facility] EDD 06/01/2024 - NIPT low risk female 4) history of cesarean delivery: G1 primary section for macrosomia, EFW 10 pounds.  Successful VBAC with G2, desires VBAC 5) history of gestational hypertension: G2 6) history of postpartum hemorrhage: G2 7) maternal anemia: Hemoglobin 9.1 at 28 weeks  We reviewed initial delivery room management, including CPAP, [place], and low but certainly possible need for intubation for surfactant administration.  We discussed feeding immaturity and need for full po intake with multiple days of good weight gain and no apnea or bradycardia before discharge.  We reviewed increased risk of jaundice, infection, and temperature instability.   Discussed likely length of stay.  Thank you for allowing us  to participate in her care.  Please call with questions.  Total clinician time devoted to the patient on the day of this visit excluding time spent on other separately reported services was 40 minutes.  Ronnie Colander, DO  Neonatologist

## 2024-03-27 NOTE — Consult Note (Signed)
 Initial Consultation Note   Patient: Beth [name] ZOX:[id number] DOB: [birth year] PCP: Patient, No Pcp Per DOA: 03/24/2024 DOS: the patient was seen and examined on 03/27/2024 Primary service: [name], MD  Referring physician: Jeneane Miracle, NP Reason for consult: Hypokalemia  Assessment/Plan: 8F h/o G4P3003 at [redacted]w[redacted]d, and anxiety p/w hypokalemia.  Hypokalemia Mg >2.4 since 5/31 but 1.6 on 5/30; Presumably related to hypomagnesemia/ROMK channel and gentle repletion per OB team  -Replete K orally with 40mEq TID (allow atleast 4-6 house between each dose) x3 doses -D/c IV K repletion -F/u BMP BID until K wnl (presumably tomorrow morning)   TRH will sign off at present, please call us  again when needed.  HPI: Beth [name] is a 38 y.o. female with past medical history of G4P3003 at [redacted]w[redacted]d, and anxiety p/w hypokalemia.  Pt states that she was in her USOH until she was evaluated by her OP neurologist on 5/29 for migraines and was found to have elevated BP 178/90 per pt; as such, she was referred to her Op OBGYN who evaluated her later that day and admitted her after finding continued elevated BP. Since admission, pt has been persistently hypokalemic despite Mg and K repletion. Medicine consulted to address hypokalemia.  Review of Systems: As mentioned in the history of present illness. All other systems reviewed and are negative. Past Medical History:  Diagnosis Date   Anemia    Anxiety    Bulging lumbar disc    Headache    otc med prn - last one 4 months ago   History of blood transfusion    after 04/02/2016 svd at [facility]   Seizures (HCC)    Childhood   SVD (spontaneous vaginal delivery)    x 1   Past Surgical History:  Procedure Laterality Date   CESAREAN SECTION N/A 05/26/2017   Procedure: CESAREAN SECTION;  Surgeon: Artemisa Bile, MD;  Location: [facility] BIRTHING SUITES;  Service: Obstetrics;  Laterality: N/A;   DILATION AND CURETTAGE OF UTERUS N/A 06/12/2016   Procedure: DILATATION AND  CURETTAGE;  Surgeon: Artemisa Bile, MD;  Location: [facility] ORS;  Service: Gynecology;  Laterality: N/A;   TONSILLECTOMY     WISDOM TOOTH EXTRACTION     Social History:  reports that she has never smoked. She has never used smokeless tobacco. She reports that she does not drink alcohol and does not use drugs.  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    Penicillins Rash and Other (See Comments)    Has patient had a PCN reaction causing immediate rash, facial/tongue/throat swelling, SOB or lightheadedness with hypotension: No Has patient had a PCN reaction causing severe rash involving mucus membranes or skin necrosis: No Has patient had a PCN reaction that required hospitalization: No Has patient had a PCN reaction occurring within the last 10 years: Yes If all of the above answers are "NO", then may proceed with Cephalosporin use.    Strawberry (Diagnostic) Itching    Family History  Problem Relation Age of Onset   Dementia Mother    Hypertension Other    Cancer Other     Prior to Admission medications   Medication Sig Start Date End Date Taking? Authorizing Provider  ibuprofen  (ADVIL ) 800 MG tablet Take 1 tablet (800 mg total) by mouth every 8 (eight) hours as needed. Patient not taking: Reported on 03/24/2024 05/20/21   Shivaji, Martin Slay, MD  Iron -FA-B Cmp-C-Biot-Probiotic (FUSION PLUS) CAPS Take 1 capsule  by mouth daily.    [provider]  NIFEdipine  (ADALAT  CC) 60 MG 24 hr tablet Take 1 tablet (60 mg total) by mouth in the morning and at bedtime. Patient not taking: Reported on 03/24/2024 05/20/21   Shivaji, Martin Slay, MD  Prenatal MV-Min-FA-Omega-3 (PRENATAL GUMMIES/DHA & FA) 0.4-32.5 MG CHEW Chew 2 each by mouth daily.    [provider]  tirzepatide  (ZEPBOUND ) 7.5 MG/0.5ML Pen Inject 7.5 mg into the skin every 7 (seven) days. Patient not taking: Reported on 03/24/2024 12/10/22       Physical Exam: Vitals:    03/26/24 2205 03/27/24 0337 03/27/24 0810 03/27/24 1208  BP: 124/69 136/78 (!) 140/77 (!) 143/69  Pulse: 72 80 81 79  Resp: 17 18 17 18   Temp:  97.8 F (36.6 C) 98.1 F (36.7 C) 98.4 F (36.9 C)  TempSrc:  Oral Oral Oral  SpO2: 98% 98% 99% 100%  Weight:      Height:       General: Alert, oriented x3, resting comfortably in no acute distress Respiratory: Lungs clear to auscultation bilaterally with normal respiratory effort; no w/r/r Cardiovascular: Regular rate and rhythm w/o m/r/g  Data Reviewed:   Lab Results  Component Value Date   WBC 7.6 03/27/2024   HGB 8.0 (L) 03/27/2024   HCT 26.0 (L) 03/27/2024   MCV 76.5 (L) 03/27/2024   PLT 247 03/27/2024   Lab Results  Component Value Date   GLUCOSE 89 03/27/2024   CALCIUM  10.3 03/27/2024   NA 140 03/27/2024   K 2.2 (LL) 03/27/2024   CO2 26 03/27/2024   CL 106 03/27/2024   BUN <5 (L) 03/27/2024   CREATININE 0.61 03/27/2024   Lab Results  Component Value Date   ALT 17 03/27/2024   AST 21 03/27/2024   ALKPHOS 52 03/27/2024   BILITOT 0.5 03/27/2024   Lab Results  Component Value Date   INR 1.16 04/02/2016   INR 1.13 04/02/2016    Radiology: MR ANGIO HEAD WO CONTRAST Result Date: 03/26/2024 CLINICAL DATA:  Initial evaluation for headache, new or worsening, currently pregnant. EXAM: MRI HEAD WITHOUT CONTRAST MRA HEAD WITHOUT CONTRAST TECHNIQUE: Multiplanar, multi-echo pulse sequences of the brain and surrounding structures were acquired without intravenous contrast. Angiographic images of the Circle of Willis were acquired using MRA technique without intravenous contrast. COMPARISON:  None Available. FINDINGS: MRI HEAD FINDINGS Brain: Cerebral volume within normal limits. No focal parenchymal signal abnormality or significant cerebral white matter disease. No evidence for acute or subacute infarct. No areas of chronic cortical infarction or other insult. No acute or chronic intracranial blood products. No mass lesion,  midline shift or mass effect. No hydrocephalus or extra-axial fluid collection. Pituitary gland within normal limits. Vascular: Major intracranial vascular flow voids are maintained. Skull and upper cervical spine: Craniocervical junction within normal limits. Decreased T1 signal intensity noted within the visualized bone marrow, suspected to be related to current pregnancy. No scalp soft tissue abnormality. Sinuses/Orbits: Globes and orbital soft tissues within normal limits. Scattered mucosal thickening present about the frontoethmoidal and maxillary sinuses. No mastoid effusion. Other: None. MRA HEAD FINDINGS Examination mildly degraded by motion artifact. Anterior circulation: Both internal carotid arteries are patent to the siphons without stenosis or other abnormality. A1 segments patent bilaterally. Normal anterior communicating artery complex. Anterior cerebral arteries are patent without stenosis. No M1 stenosis or occlusion. Distal MCA branches perfused and symmetric. Posterior circulation: Both V4 segments are patent to the vertebrobasilar junction without significant stenosis. Right PICA patent.  Left PICA origin not well seen. Short-segment fenestration at the vertebrobasilar junction noted. Basilar patent without stenosis. Superior cerebral arteries patent bilaterally. Predominant fetal type origin of the PCAs bilaterally. Both PCAs patent without significant stenosis. Anatomic variants: As above. No intracranial aneurysm or other vascular malformation. IMPRESSION: MRI HEAD: 1. Normal brain MRI. No acute intracranial abnormality. 2. Mild frontoethmoidal and maxillary sinusitis. MRA HEAD: Normal intracranial MRA. Electronically Signed   By: Virgia Griffins M.D.   On: 03/26/2024 21:55   MR BRAIN WO CONTRAST Result Date: 03/26/2024 CLINICAL DATA:  Initial evaluation for headache, new or worsening, currently pregnant. EXAM: MRI HEAD WITHOUT CONTRAST MRA HEAD WITHOUT CONTRAST TECHNIQUE: Multiplanar,  multi-echo pulse sequences of the brain and surrounding structures were acquired without intravenous contrast. Angiographic images of the Circle of Willis were acquired using MRA technique without intravenous contrast. COMPARISON:  None Available. FINDINGS: MRI HEAD FINDINGS Brain: Cerebral volume within normal limits. No focal parenchymal signal abnormality or significant cerebral white matter disease. No evidence for acute or subacute infarct. No areas of chronic cortical infarction or other insult. No acute or chronic intracranial blood products. No mass lesion, midline shift or mass effect. No hydrocephalus or extra-axial fluid collection. Pituitary gland within normal limits. Vascular: Major intracranial vascular flow voids are maintained. Skull and upper cervical spine: Craniocervical junction within normal limits. Decreased T1 signal intensity noted within the visualized bone marrow, suspected to be related to current pregnancy. No scalp soft tissue abnormality. Sinuses/Orbits: Globes and orbital soft tissues within normal limits. Scattered mucosal thickening present about the frontoethmoidal and maxillary sinuses. No mastoid effusion. Other: None. MRA HEAD FINDINGS Examination mildly degraded by motion artifact. Anterior circulation: Both internal carotid arteries are patent to the siphons without stenosis or other abnormality. A1 segments patent bilaterally. Normal anterior communicating artery complex. Anterior cerebral arteries are patent without stenosis. No M1 stenosis or occlusion. Distal MCA branches perfused and symmetric. Posterior circulation: Both V4 segments are patent to the vertebrobasilar junction without significant stenosis. Right PICA patent. Left PICA origin not well seen. Short-segment fenestration at the vertebrobasilar junction noted. Basilar patent without stenosis. Superior cerebral arteries patent bilaterally. Predominant fetal type origin of the PCAs bilaterally. Both PCAs patent  without significant stenosis. Anatomic variants: As above. No intracranial aneurysm or other vascular malformation. IMPRESSION: MRI HEAD: 1. Normal brain MRI. No acute intracranial abnormality. 2. Mild frontoethmoidal and maxillary sinusitis. MRA HEAD: Normal intracranial MRA. Electronically Signed   By: Virgia Griffins M.D.   On: 03/26/2024 21:55   Family Communication: N/A Primary team communication: Secure chat messaged Jeneane Miracle.  Thank you very much for involving us  in the care of your patient.  ------- I spent 55 minutes reviewing previous labs/notes, obtaining separate history at the bedside, counseling/discussing the treatment plan outlined above, ordering medications/tests, and performing clinical documentation.   Author: Arne Langdon, MD 03/27/2024 2:36 PM  For on call review [email or web address].

## 2024-03-28 DIAGNOSIS — O99213 Obesity complicating pregnancy, third trimester: Secondary | ICD-10-CM

## 2024-03-28 DIAGNOSIS — O1493 Unspecified pre-eclampsia, third trimester: Secondary | ICD-10-CM | POA: Diagnosis not present

## 2024-03-28 DIAGNOSIS — Z3A30 30 weeks gestation of pregnancy: Secondary | ICD-10-CM | POA: Diagnosis not present

## 2024-03-28 DIAGNOSIS — E876 Hypokalemia: Secondary | ICD-10-CM | POA: Diagnosis not present

## 2024-03-28 DIAGNOSIS — O09523 Supervision of elderly multigravida, third trimester: Secondary | ICD-10-CM | POA: Diagnosis not present

## 2024-03-28 LAB — BASIC METABOLIC PANEL WITH GFR
Anion gap: 6 (ref 5–15)
Anion gap: 10 (ref 5–15)
BUN: 5 mg/dL — ABNORMAL LOW (ref 6–20)
BUN: 5 mg/dL — ABNORMAL LOW (ref 6–20)
CO2: 26 mmol/L (ref 22–32)
CO2: 24 mmol/L (ref 22–32)
Calcium: 9.7 mg/dL (ref 8.9–10.3)
Calcium: 10.5 mg/dL — ABNORMAL HIGH (ref 8.9–10.3)
Chloride: 106 mmol/L (ref 98–111)
Chloride: 105 mmol/L (ref 98–111)
Creatinine, Ser: 0.6 mg/dL (ref 0.44–1.00)
Creatinine, Ser: 0.64 mg/dL (ref 0.44–1.00)
GFR, Estimated: >60 mL/min (ref >60)
GFR, Estimated: >60 mL/min (ref >60)
Glucose, Bld: 85 mg/dL (ref 70–99)
Glucose, Bld: 118 mg/dL — ABNORMAL HIGH (ref 70–99)
Potassium: 2.3 mmol/L — CL (ref 3.5–5.1)
Potassium: 2.8 mmol/L — ABNORMAL LOW (ref 3.5–5.1)
Sodium: 138 mmol/L (ref 135–145)
Sodium: 139 mmol/L (ref 135–145)

## 2024-03-28 LAB — MAGNESIUM: Magnesium: 1.5 mg/dL — ABNORMAL LOW (ref 1.7–2.4)

## 2024-03-28 LAB — NA AND K (SODIUM & POTASSIUM), RAND UR
Potassium Urine: 17 mmol/L
Sodium, Ur: 58 mmol/L

## 2024-03-28 LAB — TSH: TSH: 1.263 u[IU]/mL (ref 0.350–4.500)

## 2024-03-28 MED ORDER — SODIUM CHLORIDE 0.9% FLUSH
3.0000 mL | Freq: Two times a day (BID) | INTRAVENOUS | Status: DC
  Administered 2024-03-28 – 2024-04-18 (×32): 3 mL via INTRAVENOUS

## 2024-03-28 MED ORDER — LABETALOL HCL 200 MG PO TABS
600.0000 mg | ORAL_TABLET | Freq: Three times a day (TID) | ORAL | Status: DC
  Administered 2024-03-28 – 2024-04-01 (×12): 600 mg via ORAL
  Filled 2024-03-28 (×8): qty 3
  Filled 2024-03-28: qty 6
  Filled 2024-03-28 (×3): qty 3

## 2024-03-28 MED ORDER — SODIUM CHLORIDE 0.9 % IV SOLN: INTRAVENOUS | Status: AC | PRN

## 2024-03-28 MED ORDER — MENTHOL 3 MG MT LOZG
1.0000 | LOZENGE | OROMUCOSAL | Status: DC | PRN
Start: 2024-03-28 — End: 2024-04-20

## 2024-03-28 MED ORDER — POTASSIUM CHLORIDE CRYS ER 20 MEQ PO TBCR
40.0000 meq | EXTENDED_RELEASE_TABLET | Freq: Three times a day (TID) | ORAL | Status: AC
  Administered 2024-03-28 (×3): 40 meq via ORAL
  Filled 2024-03-28 (×3): qty 2

## 2024-03-28 MED ORDER — GUAIFENESIN 100 MG/5ML PO LIQD
5.0000 mL | ORAL | Status: DC | PRN
Start: 2024-03-28 — End: 2024-04-20
  Administered 2024-03-28 – 2024-03-29 (×2): 5 mL via ORAL
  Filled 2024-03-28 (×2): qty 15

## 2024-03-28 MED ORDER — LABETALOL HCL 200 MG PO TABS
200.0000 mg | ORAL_TABLET | Freq: Once | ORAL | Status: AC
  Administered 2024-03-28: 200 mg via ORAL
  Filled 2024-03-28: qty 1

## 2024-03-28 MED ORDER — MAGNESIUM SULFATE 2 GM/50ML IV SOLN
2.0000 g | Freq: Once | INTRAVENOUS | Status: AC
  Administered 2024-03-28: 2 g via INTRAVENOUS
  Filled 2024-03-28 (×4): qty 50

## 2024-03-28 MED ORDER — POTASSIUM CHLORIDE CRYS ER 20 MEQ PO TBCR
40.0000 meq | EXTENDED_RELEASE_TABLET | Freq: Every day | ORAL | Status: DC
  Administered 2024-03-30: 40 meq via ORAL
  Filled 2024-03-28: qty 2

## 2024-03-28 NOTE — Progress Notes (Addendum)
  Progress Note   Patient: Beth [name] ZOX:[id number] DOB: 1986/06/07 DOA: 03/24/2024     4 DOS: the patient was seen and examined on 03/28/2024   Brief hospital course: Beth [name] is a 38 year old with pmh of anxiety and G4P3003 at [redacted]w[redacted]d who presents with hypokalemia.   Assessment and Plan:  Preeclampsia -per Ob/gyn  Hypokalemia Acute.  Potassium still noted to be low at 2.3 today.  Urinary potassium have been 12 which indicated appropriate renal potassium conservation.  Patient without reported nausea, vomiting, or diarrhea as the cause.  Previously magnesium  levels have been low which could have been a cause.  Notes family history of renal potassium wasting disorder.  Patient currently not not appear to have characteristics fitting with Gitelman or Bartter syndrome.  Working diagnosis is poor absorption related to hypomagnesemia. - Recheck urine potassium(17 consistent with potassium conservation and not wasting) - Check magnesium  daily.( Magnesium  1.5. Order placed for replacement) - Check renin aldosterone activity - Check TSH(1.263 wnl) - Continue with potassium chloride  40 meq p.o. 3 times daily.  Reassess in a.m. and discontinue being medically appropriate - Serial monitoring of potassium levels - TRH will continue to follow.     Subjective: She initially had gone to the neurologist for her history of migraines, during which her blood pressure was found to be significantly elevated.  She was referred to her OB who subsequently referred her to hospital   She has been taking potassium supplements three times daily without difficulty.  She experiences constipation with one bowel movement per day, which is not watery but rather difficult. No nausea or vomiting.  There is a family history of renal issues related to potassium, although she is not well-informed about the specifics.  Physical Exam: Vitals:   03/27/24 2017 03/27/24 2319 03/28/24 0411 03/28/24 0747  BP: (!) 151/85  (!) 141/80 116/77 (!) 149/83  Pulse: 79 73 72 70  Resp: 18 17 16 17   Temp: 98 F (36.7 C) 98 F (36.7 C) 98.2 F (36.8 C) (!) 97.5 F (36.4 C)  TempSrc: Oral Oral Oral Oral  SpO2:  99% 99% 99%  Weight:      Height:        Constitutional: Middle-aged female currently in no acute distress Eyes: PERRL, lids and conjunctivae normal ENMT: Mucous membranes are moist. .Normal dentition.  Neck: normal, supple, no masses, no thyromegaly Respiratory: clear to auscultation bilaterally, no wheezing, no crackles. Normal respiratory effort. No accessory muscle use.  Cardiovascular: Regular rate and rhythm, no murmurs / rubs / gallops.   Abdomen: Gravid abdomen. Psychiatric: Normal judgment and insight. Alert and oriented x 3. Normal mood.   Data Reviewed:  Reviewed labs, imaging, and pertinent records as documented. Family Communication: None  Disposition: Status is: Inpatient Remains inpatient appropriate because: Continue hypokalemia and see     Time spent: 20 minutes  Author: Lena Qualia, MD 03/28/2024 8:17 AM  For on call review [email or web address].

## 2024-03-28 NOTE — Consult Note (Addendum)
 MFM Consult Note  Beth [name] is currently at 30 weeks and 5 days.  She has been admitted due to preeclampsia.    She has received a complete course of antenatal corticosteroids and magnesium  for maternal seizure prophylaxis.  Her 24-hour urine showed 372 mg of protein, indicating that she has preeclampsia. Her PIH labs have all been within normal limits.  However, she continues to have a low potassium level.  The patient reports that she is feeling fine today.  Her headache has resolved.  Her MRI/MRA of the brain did not show any acute pathology.  Her blood pressures remain mildly elevated in the 140s to 150s over 80s to 90s range.  Her labetalol  dose was increased to 400 mg 3 times a day.  She should be observed in the hospital for a few more days to complete the workup as to why her potassium levels are low and to observe her blood pressures.    Should she remain stable later this week and her blood pressures are under good control, outpatient management with twice weekly fetal testing and blood pressure checks along with weekly PIH labs may be considered.    The patient understands that delivery may be recommended should she experience any signs or symptoms of preeclampsia or should her blood pressures be in the severe range.    Due to preeclampsia, delivery is recommended at around 34 weeks.    She should receive a rescue course of steroids at 33 weeks and 5 days.    The patient stated that all of her questions were answered today and she is comfortable with this management plan.

## 2024-03-28 NOTE — Progress Notes (Signed)
 Patient ID: Beth [name], female   DOB: 1986/06/24, 38 y.o.   MRN: [id number]  Beth [name] 38 y.o. [id number] at [redacted]w[redacted]d HD#5 admitted with severe preeclampsia   S: Feeling well this morning. No headache. Does report some dry cough, no other URI symptoms.    O: Vitals:   03/27/24 2319 03/28/24 0411 03/28/24 0747 03/28/24 0748  BP: (!) 141/80 116/77 (!) 149/83 (!) 146/95  Pulse: 73 72 70 70  Resp: 17 16 17    Temp: 98 F (36.7 C) 98.2 F (36.8 C) (!) 97.5 F (36.4 C)   TempSrc: Oral Oral Oral   SpO2: 99% 99% 99%   Weight:      Height:           Gen: obese, NAD Lungs: nonlabored respirations Abd: gravid, soft, non-tender Ext: no calf edema or tenderness   NST: pending this AM, reactive yesterday PM Toco: quiet      Latest Ref Rng & Units 03/28/2024    4:45 AM 03/27/2024    4:31 AM 03/26/2024    5:37 AM  CMP  Glucose 70 - 99 mg/dL 85  89  132   BUN 6 - 20 mg/dL 5  <5  <5   Creatinine 0.44 - 1.00 mg/dL 4.40  1.02  7.25   Sodium 135 - 145 mmol/L 138  140  140   Potassium 3.5 - 5.1 mmol/L 2.3  2.2  2.5   Chloride 98 - 111 mmol/L 106  106  109   CO2 22 - 32 mmol/L 26  26  24    Calcium  8.9 - 10.3 mg/dL 9.7  36.6  9.9   Total Protein 6.5 - 8.1 g/dL  5.4  5.9   Total Bilirubin 0.0 - 1.2 mg/dL  0.5  0.6   Alkaline Phos 38 - 126 U/L  52  52   AST 15 - 41 U/L  21  29   ALT 0 - 44 U/L  17  19       Latest Reference Range & Units 03/26/24 11:19  Protein, 24H Urine 50 - 100 mg/day 372 (H)  (H): Data is abnormally high      A/p: Beth [name] 37 y.o. [id number] at [redacted]w[redacted]d HD#5 admitted with severe range BPs PreE w/ SF: s/p magnesium  sulfate, blood pressures still mildly elevated so labetalol  increased to 400mg  TID. Will titrate PRN.  Preterm: s/p BMZ x 2. S/p NICU consult 6/1. Growth scan on 5/30: EFW 99% (2167g = 4lb12oz), repeat in 3-4 weeks.  Hypokalemia, unclear etiology: Refractory to repletion so far. Magnesium  level normal, urine K normal. No emesis/diarrhea. Hospitalist consulted  yesterday and recommended PO Kcl 40mEq x 3 doses, without improvement this AM. Hospitalist service consulted again this AM, appreciate recommendations. Patient reports uncle with potassium wasting disorder, which resulted in heart failure.  Iron  deficiency anemia: hgb 8.4, ferritin 6. IV iron  5/30. Continue PO iron   HA: MRI completed yesterday, normal. Fioricet /tylenol  PRN, reviewed risk of rebound with fioricet , use sparingly NST qshift MOD: plan delivery at 34 weeks. Desires VBAC (h/o SVD, followed by primary cesarean, followed by successful VBAC, pelvis proven to 10lbs. Baby was transverse/breech on 5/30 scan.   [name] MD 03/28/24 10:23 AM

## 2024-03-29 DIAGNOSIS — E876 Hypokalemia: Secondary | ICD-10-CM | POA: Diagnosis not present

## 2024-03-29 LAB — BASIC METABOLIC PANEL WITH GFR
Anion gap: 8 (ref 5–15)
BUN: 7 mg/dL (ref 6–20)
CO2: 23 mmol/L (ref 22–32)
Calcium: 10.4 mg/dL — ABNORMAL HIGH (ref 8.9–10.3)
Chloride: 109 mmol/L (ref 98–111)
Creatinine, Ser: 0.48 mg/dL (ref 0.44–1.00)
GFR, Estimated: >60 mL/min (ref >60)
Glucose, Bld: 81 mg/dL (ref 70–99)
Potassium: 3 mmol/L — ABNORMAL LOW (ref 3.5–5.1)
Sodium: 140 mmol/L (ref 135–145)

## 2024-03-29 LAB — COMPREHENSIVE METABOLIC PANEL WITH GFR
ALT: 14 U/L (ref 0–44)
AST: 19 U/L (ref 15–41)
Albumin: 2.2 g/dL — ABNORMAL LOW (ref 3.5–5.0)
Alkaline Phosphatase: 49 U/L (ref 38–126)
Anion gap: 5 (ref 5–15)
BUN: 5 mg/dL — ABNORMAL LOW (ref 6–20)
CO2: 26 mmol/L (ref 22–32)
Calcium: 10.2 mg/dL (ref 8.9–10.3)
Chloride: 109 mmol/L (ref 98–111)
Creatinine, Ser: 0.54 mg/dL (ref 0.44–1.00)
GFR, Estimated: >60 mL/min (ref >60)
Glucose, Bld: 96 mg/dL (ref 70–99)
Potassium: 2.7 mmol/L — CL (ref 3.5–5.1)
Sodium: 140 mmol/L (ref 135–145)
Total Bilirubin: 0.5 mg/dL (ref 0.0–1.2)
Total Protein: 5 g/dL — ABNORMAL LOW (ref 6.5–8.1)

## 2024-03-29 LAB — CORTISOL: Cortisol, Plasma: 1.6 ug/dL

## 2024-03-29 LAB — CALCIUM, URINE, RANDOM: Calcium, Ur: 13.6 mg/dL

## 2024-03-29 LAB — MAGNESIUM: Magnesium: 1.6 mg/dL — ABNORMAL LOW (ref 1.7–2.4)

## 2024-03-29 MED ORDER — MAGNESIUM SULFATE 2 GM/50ML IV SOLN
2.0000 g | INTRAVENOUS | Status: AC
  Administered 2024-03-29 (×3): 2 g via INTRAVENOUS
  Filled 2024-03-29 (×3): qty 50

## 2024-03-29 MED ORDER — POTASSIUM CHLORIDE CRYS ER 20 MEQ PO TBCR
40.0000 meq | EXTENDED_RELEASE_TABLET | ORAL | Status: AC
  Administered 2024-03-29 (×2): 40 meq via ORAL
  Filled 2024-03-29 (×2): qty 2

## 2024-03-29 MED ORDER — POTASSIUM CHLORIDE CRYS ER 20 MEQ PO TBCR
40.0000 meq | EXTENDED_RELEASE_TABLET | Freq: Once | ORAL | Status: AC
  Administered 2024-03-29: 40 meq via ORAL
  Filled 2024-03-29: qty 2

## 2024-03-29 MED ORDER — DIPHENHYDRAMINE HCL 50 MG/ML IJ SOLN
50.0000 mg | Freq: Once | INTRAMUSCULAR | Status: AC
  Administered 2024-03-29: 50 mg via INTRAVENOUS

## 2024-03-29 NOTE — Consult Note (Signed)
 Initial Consultation Note   Patient: Beth [name] WUJ:[id number] DOB: 1986/05/04 PCP: Patient, No Pcp Per DOA: 03/24/2024 DOS: the patient was seen and examined on 03/29/2024 Primary service: [name], MD  Referring physician: Avanell Bob Reason for consult: hypokalemia   Assessment and Plan:  Preeclampsia Per Ob/gyn BP improving, now on labetalol  Aiming for EGA [redacted] weeks prior to delivery   Hypokalemia Acute and refractory Potassium still noted to be low at 2.7 today Spot urinary potassium is appropriate and appears to rule out renal potassium wasting disorder May also be associated with poor absorption related to hypomagnesemia Will also check a cortisol level to evaluate for adrenal insufficiency Continue to check and replete K+ and Mag++ Renin aldosterone testing pending      TRH will continue to follow the patient.  HPI: Beth [name] is a 38 y.o. female G4P3 at 18+5 with past medical history of anxiety who presented on 5/29 with hypokalemia.  + FH of renal potassium wasting d/o.  Working diagnosis is poor absorption related to hypomagnesemia.  She is feeling ok but Mag++ gives her a headache.  She reports that if K+/Mag++ and symptoms can be controlled, she may be discharged with plan for delivery at 71 weeks' gestation; otherwise, she will remain hospitalized until delivery.  She has never had these issues with prior 3 pregnancies.  Review of Systems: As mentioned in the history of present illness. All other systems reviewed and are negative. Past Medical History:  Diagnosis Date   Anemia    Anxiety    Bulging lumbar disc    Headache    otc med prn - last one 4 months ago   History of blood transfusion    after 04/02/2016 svd at [facility]   Seizures (HCC)    Childhood   SVD (spontaneous vaginal delivery)    x 1   Past Surgical History:  Procedure Laterality Date   CESAREAN SECTION N/A 05/26/2017   Procedure: CESAREAN SECTION;  Surgeon: Artemisa Bile, MD;  Location: [facility] BIRTHING  SUITES;  Service: Obstetrics;  Laterality: N/A;   DILATION AND CURETTAGE OF UTERUS N/A 06/12/2016   Procedure: DILATATION AND CURETTAGE;  Surgeon: Artemisa Bile, MD;  Location: [facility] ORS;  Service: Gynecology;  Laterality: N/A;   TONSILLECTOMY     WISDOM TOOTH EXTRACTION     Social History:  reports that she has never smoked. She has never used smokeless tobacco. She reports that she does not drink alcohol and does not use drugs.  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    Penicillins Rash and Other (See Comments)    Has patient had a PCN reaction causing immediate rash, facial/tongue/throat swelling, SOB or lightheadedness with hypotension: No Has patient had a PCN reaction causing severe rash involving mucus membranes or skin necrosis: No Has patient had a PCN reaction that required hospitalization: No Has patient had a PCN reaction occurring within the last 10 years: Yes If all of the above answers are "NO", then may proceed with Cephalosporin use.    Strawberry (Diagnostic) Itching    Family History  Problem Relation Age of Onset   Dementia Mother    Hypertension Other    Cancer Other     Prior to Admission medications   Medication Sig Start Date End Date Taking? Authorizing Provider  ibuprofen  (ADVIL ) 800 MG tablet Take 1 tablet (800 mg total) by mouth every 8 (eight) hours as needed. Patient not  taking: Reported on 03/24/2024 05/20/21   Shivaji, Martin Slay, MD  Iron -FA-B Cmp-C-Biot-Probiotic (FUSION PLUS) CAPS Take 1 capsule by mouth daily.    [provider]  NIFEdipine  (ADALAT  CC) 60 MG 24 hr tablet Take 1 tablet (60 mg total) by mouth in the morning and at bedtime. Patient not taking: Reported on 03/24/2024 05/20/21   Shivaji, Martin Slay, MD  Prenatal MV-Min-FA-Omega-3 (PRENATAL GUMMIES/DHA & FA) 0.4-32.5 MG CHEW Chew 2 each by mouth daily.    [provider]  tirzepatide  (ZEPBOUND ) 7.5 MG/0.5ML Pen Inject  7.5 mg into the skin every 7 (seven) days. Patient not taking: Reported on 03/24/2024 12/10/22       Physical Exam: Vitals:   03/28/24 1834 03/28/24 2019 03/28/24 2321 03/29/24 0400  BP: (!) 148/80 (!) 155/79 128/68 120/60  Pulse: 79 83 72 73  Resp:  19 18 18   Temp:  98.2 F (36.8 C) 98.1 F (36.7 C) 98 F (36.7 C)  TempSrc:  Oral Oral Oral  SpO2:   98% 99%  Weight:      Height:       No intake or output data in the 24 hours ending 03/29/24 0735 Filed Weights   03/24/24 1417  Weight: (!) 161.5 kg    Exam:  General:  Appears calm and comfortable and is in NAD Eyes:   normal lids, iris ENT:  grossly normal hearing, lips & tongue, mmm Cardiovascular:  RRR. No LE edema.  Respiratory:   CTA bilaterally with no wheezes/rales/rhonchi.  Normal respiratory effort. Abdomen:  gravid, obese, on toco Skin:  no rash or induration seen on limited exam Musculoskeletal:  grossly normal tone BUE/BLE, good ROM, no bony abnormality Psychiatric:  grossly normal mood and affect, speech fluent and appropriate, AOx3 Neurologic:  CN 2-12 grossly intact, moves all extremities in coordinated fashion  Data Reviewed: I have reviewed the patient's lab results since admission.  Pertinent labs for today include:  K+ 2.7 Mag++ 1.6 Albumin 2.2     Family Communication: None present  Thank you very much for involving us  in the care of your patient.  Author: Lorita Rosa, MD 03/29/2024 7:33 AM  For on call review [email or web address].

## 2024-03-29 NOTE — Progress Notes (Signed)
 Patient ID: Beth [name], female   DOB: 08-14-1986, 38 y.o.   MRN: [id number]  [name] 38 y.o. [id number] at [redacted]w[redacted]d HD#6 admitted with severe preeclampsia   S: Feeling well this morning. No headache. +FM, denies VB, LOF, cramping   O: Vitals:   03/28/24 2019 03/28/24 2321 03/29/24 0400 03/29/24 0807  BP: (!) 155/79 128/68 120/60 139/61  Pulse: 83 72 73 74  Resp: 19 18 18 17   Temp: 98.2 F (36.8 C) 98.1 F (36.7 C) 98 F (36.7 C) 98.1 F (36.7 C)  TempSrc: Oral Oral Oral Oral  SpO2:  98% 99% 100%  Weight:      Height:           Gen: obese, NAD Lungs: nonlabored respirations Abd: gravid, soft, non-tender Ext: no calf edema or tenderness   NST: pending this AM, reactive yesterday PM Toco: quiet      Latest Ref Rng & Units 03/29/2024    4:23 AM 03/28/2024    8:03 PM 03/28/2024    4:45 AM  CMP  Glucose 70 - 99 mg/dL 96  147  85   BUN 6 - 20 mg/dL 5  5  5    Creatinine 0.44 - 1.00 mg/dL 8.29  5.62  1.30   Sodium 135 - 145 mmol/L 140  139  138   Potassium 3.5 - 5.1 mmol/L 2.7  2.8  2.3   Chloride 98 - 111 mmol/L 109  105  106   CO2 22 - 32 mmol/L 26  24  26    Calcium  8.9 - 10.3 mg/dL 86.5  78.4  9.7   Total Protein 6.5 - 8.1 g/dL 5.0     Total Bilirubin 0.0 - 1.2 mg/dL 0.5     Alkaline Phos 38 - 126 U/L 49     AST 15 - 41 U/L 19     ALT 0 - 44 U/L 14         Latest Reference Range & Units 03/26/24 11:19  Protein, 24H Urine 50 - 100 mg/day 372 (H)  (H): Data is abnormally high      A/p: [name] 37 y.o. [id number] at [redacted]w[redacted]d HD#6 admitted with severe range Bps. New-onset hypokalemia PreE w/ SF: s/p magnesium  sulfate, BP now seem improved and in-control on labetalol  600mg  TID. Continue to monitor Preterm: s/p BMZ x 2. S/p NICU consult 6/1. Growth scan on 5/30: EFW 99% (2167g = 4lb12oz), repeat in 3-4 weeks.  Hypokalemia, unclear etiology: Refractory to repletion so far. Magnesium  level low normal to normal, urine K and Na normal. No emesis/diarrhea. Hospitalist consulted  HD#4 and recommended PO Kcl 40mEq x 3 doses, without improvement this AM. Hospitalist now following and appreciate recommendations. Patient reports uncle with potassium wasting disorder, which resulted in heart failure.  Iron  deficiency anemia: hgb 8.4, ferritin 6. IV iron  5/30. Continue PO iron   HA: MRI completed HD#4, normal. Fioricet /tylenol  PRN, reviewed risk of rebound with fioricet , use sparingly NST qshift MOD: plan delivery at 34 weeks. Desires VBAC (h/o SVD, followed by primary cesarean, followed by successful VBAC, pelvis proven to 10lbs. Baby was transverse/breech on 5/30 scan.   [name] MD 03/29/24 10:10 AM

## 2024-03-30 DIAGNOSIS — E876 Hypokalemia: Secondary | ICD-10-CM | POA: Diagnosis not present

## 2024-03-30 LAB — CBC WITH DIFFERENTIAL/PLATELET
Abs Immature Granulocytes: 0.15 10*3/uL — ABNORMAL HIGH (ref 0.00–0.07)
Basophils Absolute: 0 10*3/uL (ref 0.0–0.1)
Basophils Relative: 0 %
Eosinophils Absolute: 0.3 10*3/uL (ref 0.0–0.5)
Eosinophils Relative: 3 %
HCT: 30.6 % — ABNORMAL LOW (ref 36.0–46.0)
Hemoglobin: 9.1 g/dL — ABNORMAL LOW (ref 12.0–15.0)
Immature Granulocytes: 2 %
Lymphocytes Relative: 20 %
Lymphs Abs: 1.7 10*3/uL (ref 0.7–4.0)
MCH: 23.5 pg — ABNORMAL LOW (ref 26.0–34.0)
MCHC: 29.7 g/dL — ABNORMAL LOW (ref 30.0–36.0)
MCV: 78.9 fL — ABNORMAL LOW (ref 80.0–100.0)
Monocytes Absolute: 0.7 10*3/uL (ref 0.1–1.0)
Monocytes Relative: 8 %
Neutro Abs: 5.7 10*3/uL (ref 1.7–7.7)
Neutrophils Relative %: 67 %
Platelets: 233 10*3/uL (ref 150–400)
RBC: 3.88 MIL/uL (ref 3.87–5.11)
RDW: 19.3 % — ABNORMAL HIGH (ref 11.5–15.5)
WBC: 8.6 10*3/uL (ref 4.0–10.5)
nRBC: 0.2 % (ref 0.0–0.2)

## 2024-03-30 LAB — COMPREHENSIVE METABOLIC PANEL WITH GFR
ALT: 15 U/L (ref 0–44)
AST: 19 U/L (ref 15–41)
Albumin: 2.3 g/dL — ABNORMAL LOW (ref 3.5–5.0)
Alkaline Phosphatase: 52 U/L (ref 38–126)
Anion gap: 6 (ref 5–15)
BUN: 5 mg/dL — ABNORMAL LOW (ref 6–20)
CO2: 24 mmol/L (ref 22–32)
Calcium: 10.3 mg/dL (ref 8.9–10.3)
Chloride: 108 mmol/L (ref 98–111)
Creatinine, Ser: 0.77 mg/dL (ref 0.44–1.00)
GFR, Estimated: >60 mL/min (ref >60)
Glucose, Bld: 87 mg/dL (ref 70–99)
Potassium: 3.1 mmol/L — ABNORMAL LOW (ref 3.5–5.1)
Sodium: 138 mmol/L (ref 135–145)
Total Bilirubin: 0.7 mg/dL (ref 0.0–1.2)
Total Protein: 5.3 g/dL — ABNORMAL LOW (ref 6.5–8.1)

## 2024-03-30 LAB — BASIC METABOLIC PANEL WITH GFR
Anion gap: 10 (ref 5–15)
BUN: 5 mg/dL — ABNORMAL LOW (ref 6–20)
CO2: 22 mmol/L (ref 22–32)
Calcium: 10.2 mg/dL (ref 8.9–10.3)
Chloride: 108 mmol/L (ref 98–111)
Creatinine, Ser: 0.53 mg/dL (ref 0.44–1.00)
GFR, Estimated: >60 mL/min (ref >60)
Glucose, Bld: 87 mg/dL (ref 70–99)
Potassium: 2.8 mmol/L — ABNORMAL LOW (ref 3.5–5.1)
Sodium: 140 mmol/L (ref 135–145)

## 2024-03-30 LAB — ALDOSTERONE + RENIN ACTIVITY W/ RATIO
ALDO / PRA Ratio: <1.1 (ref 0.0–30.0)
Aldosterone: <1 ng/dL (ref 0.0–30.0)
PRA LC/MS/MS: 0.901 ng/mL/h (ref 0.167–5.380)

## 2024-03-30 LAB — CORTISOL-AM, BLOOD: Cortisol - AM: 3.3 ug/dL — ABNORMAL LOW (ref 6.7–22.6)

## 2024-03-30 MED ORDER — POTASSIUM CHLORIDE CRYS ER 20 MEQ PO TBCR
40.0000 meq | EXTENDED_RELEASE_TABLET | Freq: Three times a day (TID) | ORAL | Status: DC
  Administered 2024-03-30 – 2024-03-31 (×2): 40 meq via ORAL
  Filled 2024-03-30 (×3): qty 2

## 2024-03-30 NOTE — Consult Note (Signed)
 Initial Consultation Note   Patient: Beth [name] QIH:[id number] DOB: 10-18-1986 PCP: Patient, No Pcp Per DOA: 03/24/2024 DOS: the patient was seen and examined on 03/30/2024 Primary service: [name], MD  Referring physician: Avanell Bob Reason for consult: Hypokalemia   Assessment and Plan:  Preeclampsia Per Ob/gyn BP improving, now on labetalol  Aiming for EGA [redacted] weeks prior to delivery   Hypokalemia Acute and refractory Potassium still noted to be low at 2.7 today Spot urinary potassium is appropriate and appears to rule out renal potassium wasting disorder May also be associated with poor absorption related to hypomagnesemia Will also check a cortisol level to evaluate for adrenal insufficiency; level was low, will recheck fasting level in AM Continue to check and replete K+ and Mag++ Renin aldosterone testing pending           TRH will continue to follow the patient.   HPI: Beth [name] is a 38 y.o. female G4P3 at 43+5 with past medical history of anxiety who presented on 5/29 with hypokalemia.  + FH of renal potassium wasting d/o.  Working diagnosis is poor absorption related to hypomagnesemia.  She is feeling ok but Mag++ gives her a headache.  She reports that if K+/Mag++ and symptoms can be controlled, she may be discharged with plan for delivery at 28 weeks' gestation; otherwise, she will remain hospitalized until delivery.  She has never had these issues with prior 3 pregnancies.   Review of Systems: As mentioned in the history of present illness. All other systems reviewed and are negative.   Past Medical History:  Diagnosis Date   Anemia    Anxiety    Bulging lumbar disc    Headache    otc med prn - last one 4 months ago   History of blood transfusion    after 04/02/2016 svd at [facility]   Seizures (HCC)    Childhood   SVD (spontaneous vaginal delivery)    x 1   Past Surgical History:  Procedure Laterality Date   CESAREAN SECTION N/A 05/26/2017   Procedure: CESAREAN  SECTION;  Surgeon: Artemisa Bile, MD;  Location: [facility] BIRTHING SUITES;  Service: Obstetrics;  Laterality: N/A;   DILATION AND CURETTAGE OF UTERUS N/A 06/12/2016   Procedure: DILATATION AND CURETTAGE;  Surgeon: Artemisa Bile, MD;  Location: [facility] ORS;  Service: Gynecology;  Laterality: N/A;   TONSILLECTOMY     WISDOM TOOTH EXTRACTION     Social History:  reports that she has never smoked. She has never used smokeless tobacco. She reports that she does not drink alcohol and does not use drugs.  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    Penicillins Rash and Other (See Comments)    Has patient had a PCN reaction causing immediate rash, facial/tongue/throat swelling, SOB or lightheadedness with hypotension: No Has patient had a PCN reaction causing severe rash involving mucus membranes or skin necrosis: No Has patient had a PCN reaction that required hospitalization: No Has patient had a PCN reaction occurring within the last 10 years: Yes If all of the above answers are "NO", then may proceed with Cephalosporin use.    Strawberry (Diagnostic) Itching    Family History  Problem Relation Age of Onset   Dementia Mother    Hypertension Other    Cancer Other     Prior to Admission medications   Medication Sig Start Date End Date Taking? Authorizing Provider  ibuprofen  (ADVIL ) 800  MG tablet Take 1 tablet (800 mg total) by mouth every 8 (eight) hours as needed. Patient not taking: Reported on 03/24/2024 05/20/21   Shivaji, Martin Slay, MD  Iron -FA-B Cmp-C-Biot-Probiotic (FUSION PLUS) CAPS Take 1 capsule by mouth daily.    [provider]  NIFEdipine  (ADALAT  CC) 60 MG 24 hr tablet Take 1 tablet (60 mg total) by mouth in the morning and at bedtime. Patient not taking: Reported on 03/24/2024 05/20/21   Shivaji, Martin Slay, MD  Prenatal MV-Min-FA-Omega-3 (PRENATAL GUMMIES/DHA & FA) 0.4-32.5 MG CHEW Chew 2 each by mouth daily.    [provider]  tirzepatide  (ZEPBOUND ) 7.5 MG/0.5ML Pen Inject 7.5 mg into the skin every 7 (seven) days. Patient not taking: Reported on 03/24/2024 12/10/22       Physical Exam: Vitals:   03/29/24 1519 03/29/24 1917 03/29/24 2331 03/30/24 0345  BP: 135/68 129/60 107/72 (!) 127/50  Pulse: 72 72 70 72  Resp: 16 17 16 16   Temp: 98 F (36.7 C) 97.8 F (36.6 C) 97.6 F (36.4 C) 98 F (36.7 C)  TempSrc: Oral Oral Oral Oral  SpO2:  100% 97% 93%  Weight:      Height:         Subjective: Feels well, no issues.  Wants to get home by the weekend for her son's 8th birthday.    Intake/Output Summary (Last 24 hours) at 03/30/2024 0717 Last data filed at 03/29/2024 1900 Gross per 24 hour  Intake 0 ml  Output --  Net 0 ml   Filed Weights   03/24/24 1417  Weight: (!) 161.5 kg    Exam:  General:  Appears calm and comfortable and is in NAD Eyes:   normal lids, iris ENT:  grossly normal hearing, lips & tongue, mmm Cardiovascular:  RRR. No LE edema.  Respiratory:   CTA bilaterally with no wheezes/rales/rhonchi.  Normal respiratory effort. Abdomen:  gravid, obese, on toco Skin:  no rash or induration seen on limited exam Musculoskeletal:  grossly normal tone BUE/BLE, good ROM, no bony abnormality Psychiatric:  grossly normal mood and affect, speech fluent and appropriate, AOx3 Neurologic:  CN 2-12 grossly intact, moves all extremities in coordinated fashion  Data Reviewed: I have reviewed the patient's lab results since admission.  Pertinent labs for today include:  K+ 2.8 Hgb 9.1, stable     Family Communication: None present  Thank you very much for involving us  in the care of your patient.  Author: Lorita Rosa, MD 03/30/2024 7:16 AM  For on call review [email or web address].

## 2024-03-30 NOTE — Progress Notes (Signed)
 Patient ID: Beth [name], female   DOB: 27-Apr-1986, 38 y.o.   MRN: [id number] BP remains stable, Pt denies HA K+ improved to 3.1 I recommend consideration for repeat iron  infusion once labs stable  Continue current plan

## 2024-03-30 NOTE — Progress Notes (Addendum)
 Patient ID: Beth [name], female   DOB: 1986/04/28, 38 y.o.   MRN: [id number] Pt reports feels well today. HA resolved, +FMs. Denies contractions   Today's Vitals   03/30/24 0612 03/30/24 0745 03/30/24 0830 03/30/24 1110  BP:  116/62  135/67  Pulse:  69  77  Resp:  15  (P) 14  Temp:  98.3 F (36.8 C)  (P) 98.2 F (36.8 C)  TempSrc:  Oral  (P) Oral  SpO2:  96%  (P) 98%  Weight:      Height:      PainSc: 0-No pain  2     Body mass index is 49.65 kg/m.  GEN - NAD ABD - cw GA  EXT - no homans  SVE - deferred  NST - 120s, +accels, no decels , moderate variability TOCO - no contractions   Potassium - 2.8 BUN 5 Hg 9.1  A/P: 40JW [id number] female at 52 0/[redacted]weeks gestation on HD#7 admitted for mgmt of severe preE ; now with hypokalemia and anaemia of pregnancy   PreE w/ SF: s/p magnesium  sulfate, BP reasonably well controlled now on labetalol  600mg  TID. Check q 4hrs Preterm gestation:  s/p BMZ x 2. S/p NICU consult 6/1. Growth scan on 5/30: EFW 99% (2167g = 4lb12oz), repeat in 3 weeks. Order placed for 6/20 inpt if still here Hypokalemia, unclear etiology: Refractory to repletion so far. Magnesium  level low normal to normal, urine K and Na normal. No emesis/diarrhea. Hospitalist consulted  on HD#4 - workup being conducted and managing replacement.  Appreciate recommendations. Patient reports uncle with potassium wasting disorder, which resulted in heart failure.  Iron  deficiency anemia: hgb 9.1, ferritin 6. IV iron  5/30. Consider repeat infusion  HA resolved:  MRI completed HD#4, normal. Fioricet /tylenol  PRN,suspect due to Mg infusion vs correction of BP NST qshift MOD: Breech/transverse on 5/30: plan delivery at 34 weeks. Desires VBAC  if baby vertex. Has h/o SVD, followed by primary cesarean, followed by successful VBAC  pelvis proven to 10lbs.

## 2024-03-31 DIAGNOSIS — E038 Other specified hypothyroidism: Secondary | ICD-10-CM | POA: Diagnosis not present

## 2024-03-31 LAB — GLUCOSE, CAPILLARY: Glucose-Capillary: 75 mg/dL (ref 70–99)

## 2024-03-31 LAB — BASIC METABOLIC PANEL WITH GFR
Anion gap: 8 (ref 5–15)
BUN: <5 mg/dL — ABNORMAL LOW (ref 6–20)
CO2: 23 mmol/L (ref 22–32)
Calcium: 10.2 mg/dL (ref 8.9–10.3)
Chloride: 109 mmol/L (ref 98–111)
Creatinine, Ser: 0.52 mg/dL (ref 0.44–1.00)
GFR, Estimated: >60 mL/min (ref >60)
Glucose, Bld: 90 mg/dL (ref 70–99)
Potassium: 3 mmol/L — ABNORMAL LOW (ref 3.5–5.1)
Sodium: 140 mmol/L (ref 135–145)

## 2024-03-31 LAB — CORTISOL-AM, BLOOD: Cortisol - AM: 9.7 ug/dL (ref 6.7–22.6)

## 2024-03-31 MED ORDER — ONDANSETRON HCL 4 MG/2ML IJ SOLN
4.0000 mg | Freq: Four times a day (QID) | INTRAMUSCULAR | Status: DC
  Filled 2024-03-31: qty 2

## 2024-03-31 MED ORDER — POTASSIUM CHLORIDE CRYS ER 20 MEQ PO TBCR
40.0000 meq | EXTENDED_RELEASE_TABLET | Freq: Four times a day (QID) | ORAL | Status: DC
  Administered 2024-03-31 – 2024-04-05 (×23): 40 meq via ORAL
  Filled 2024-03-31 (×24): qty 2

## 2024-03-31 MED ORDER — ONDANSETRON HCL 4 MG/2ML IJ SOLN
4.0000 mg | Freq: Four times a day (QID) | INTRAMUSCULAR | Status: DC | PRN
  Administered 2024-03-31 – 2024-04-19 (×16): 4 mg via INTRAVENOUS
  Filled 2024-03-31 (×17): qty 2

## 2024-03-31 MED ORDER — MAGNESIUM OXIDE -MG SUPPLEMENT 400 (240 MG) MG PO TABS
400.0000 mg | ORAL_TABLET | Freq: Two times a day (BID) | ORAL | Status: DC
  Administered 2024-03-31 (×2): 400 mg via ORAL
  Filled 2024-03-31 (×2): qty 1

## 2024-03-31 MED ORDER — COSYNTROPIN 0.25 MG IJ SOLR
0.2500 mg | Freq: Once | INTRAMUSCULAR | Status: AC
  Administered 2024-04-01: 0.25 mg via INTRAVENOUS
  Filled 2024-03-31: qty 0.25

## 2024-03-31 NOTE — Plan of Care (Signed)
 Care plan

## 2024-03-31 NOTE — Progress Notes (Addendum)
 Nutrition Assessment: Antenatal LOS > 7 days  Nutrition Recommendations: Continue Regular Diet Patient may order snacks TID from the menu and double protein portions with meals Continue prenatal vitamins Current diet prescription will provide for increased needs of pregnancy   38 year old patient, at 81 1/[redacted] weeks gestation. Admitted for management of severe preeclampsia, now with hypokalemia (unclear etiology) and anemia of pregnancy.  Patient Active Problem List   Diagnosis Date Noted   VBAC, delivered 05/18/2021   PIH (pregnancy induced hypertension) 05/16/2021   S/P cesarean section 05/26/2017   Pre-eclampsia in third trimester 05/25/2017   Active labor at term 04/02/2016   Postpartum hemorrhage, delivered 04/02/2016    Patient reports diet tolerance  as good. She reports good appetite and intake. Reports allergy to strawberry and kiwi (already entered in chart).  Weight History: Reports pre-pregnancy wt 299 lbs (135.9 kg) Pre-pregnancy BMI 41.8 kg/m2 Weight gain to date 57 lbs (25.9 kg)  Estimated needs: 3000 - 3200 Kcal/day 150 - 165 g protein/day 3 L fluid requirements   Labs and medications reviewed Hospitalist consulted and managing replacement of potassium Received IV iron  5/30. Team monitoring for need for repeat infusion   Nutrition Dx: Increased nutrient needs related pregnancy and fetal growth requirements as evidenced by [redacted] weeks gestation.  Patient is assessed at low nutritional risk. Consult Registered Dietitian if concerns arise.

## 2024-03-31 NOTE — Consult Note (Signed)
 Initial Consultation Note   Patient: Beth [name] EAV:[id number] DOB: 1986/08/29 PCP: Patient, No Pcp Per DOA: 03/24/2024 DOS: the patient was seen and examined on 03/31/2024 Primary service: [name], MD  Referring physician: Avanell Bob Reason for consult: Hypokalemia   Assessment and Plan:  Preeclampsia Per Ob/gyn BP improving, now on labetalol  Aiming for EGA [redacted] weeks prior to delivery   Hypokalemia Acute and refractory Potassium still noted to be low at 3 today Spot urinary potassium is appropriate and appears to rule out renal potassium wasting disorder May also be associated with poor absorption related to hypomagnesemia Will also check a cortisol level to evaluate for adrenal insufficiency; levels in AM and PM are both low-normal Continue to check and replete K+ and Mag++ Renin aldosterone testing pending     Patient was discussed with Dr. Kathyanne Parkers, attempted to reach [facility], and [facility] without support.  Possible ideas:  Black licorice? Pseudohyperaldosteronism - cortisol degraded quickly and doesn't reach the receptors - ?link to pregnancy Urine potassium to creatinine ratio - ordered Could do cosyntropin stim test - ordered For low K+, could look for GI or kidney losses - look at [facility], fractional excretion of potassium Based on results of above, consider renal consult, possibly RTA    TRH will continue to follow the patient.    HPI: Beth [name] is a 38 y.o. female G4P3 at 31+1 with past medical history of anxiety who presented on 5/29 with hypokalemia.  + FH of renal potassium wasting d/o.  Working diagnosis is poor absorption related to hypomagnesemia.  She is feeling ok but Mag++ gives her a headache.  She reports that if K+/Mag++ and symptoms can be controlled, she may be discharged with plan for delivery at 86 weeks' gestation; otherwise, she will remain hospitalized until delivery.  She has never had these issues with prior 3 pregnancies.   Review of  Systems: As mentioned in the history of present illness. All other systems reviewed and are negative.    Past Medical History:  Diagnosis Date   Anemia    Anxiety    Bulging lumbar disc    Headache    otc med prn - last one 4 months ago   History of blood transfusion    after 04/02/2016 svd at [facility]   Seizures (HCC)    Childhood   SVD (spontaneous vaginal delivery)    x 1   Past Surgical History:  Procedure Laterality Date   CESAREAN SECTION N/A 05/26/2017   Procedure: CESAREAN SECTION;  Surgeon: Artemisa Bile, MD;  Location: [facility] BIRTHING SUITES;  Service: Obstetrics;  Laterality: N/A;   DILATION AND CURETTAGE OF UTERUS N/A 06/12/2016   Procedure: DILATATION AND CURETTAGE;  Surgeon: Artemisa Bile, MD;  Location: [facility] ORS;  Service: Gynecology;  Laterality: N/A;   TONSILLECTOMY     WISDOM TOOTH EXTRACTION     Social History:  reports that she has never smoked. She has never used smokeless tobacco. She reports that she does not drink alcohol and does not use drugs.  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    Penicillins Rash and Other (See Comments)    Has patient had a PCN reaction causing immediate rash, facial/tongue/throat swelling, SOB or lightheadedness with hypotension: No Has patient had a PCN reaction causing severe rash involving mucus membranes or skin necrosis: No Has patient had a PCN reaction that required hospitalization: No Has patient had a PCN  reaction occurring within the last 10 years: Yes If all of the above answers are "NO", then may proceed with Cephalosporin use.    Strawberry (Diagnostic) Itching    Family History  Problem Relation Age of Onset   Dementia Mother    Hypertension Other    Cancer Other     Prior to Admission medications   Medication Sig Start Date End Date Taking? Authorizing Provider  ibuprofen  (ADVIL ) 800 MG tablet Take 1 tablet (800 mg total) by mouth every 8 (eight) hours as  needed. Patient not taking: Reported on 03/24/2024 05/20/21   Shivaji, Martin Slay, MD  Iron -FA-B Cmp-C-Biot-Probiotic (FUSION PLUS) CAPS Take 1 capsule by mouth daily.    [provider]  NIFEdipine  (ADALAT  CC) 60 MG 24 hr tablet Take 1 tablet (60 mg total) by mouth in the morning and at bedtime. Patient not taking: Reported on 03/24/2024 05/20/21   Shivaji, Martin Slay, MD  Prenatal MV-Min-FA-Omega-3 (PRENATAL GUMMIES/DHA & FA) 0.4-32.5 MG CHEW Chew 2 each by mouth daily.    [provider]  tirzepatide  (ZEPBOUND ) 7.5 MG/0.5ML Pen Inject 7.5 mg into the skin every 7 (seven) days. Patient not taking: Reported on 03/24/2024 12/10/22       Physical Exam: Vitals:   03/30/24 1642 03/30/24 1931 03/31/24 0002 03/31/24 0413  BP: 137/78 (!) 149/73 (!) 124/52 128/63  Pulse: 74 75 73 69  Resp: 17   (!) 22  Temp: 98.5 F (36.9 C) 98.2 F (36.8 C) 98 F (36.7 C) 98.3 F (36.8 C)  TempSrc: Oral   Oral  SpO2: 98%   98%  Weight:      Height:        Subjective: Feels well but getting down about still being in the hospital.  No intake or output data in the 24 hours ending 03/31/24 0738 Filed Weights   03/24/24 1417  Weight: (!) 161.5 kg    Exam:  General:  Appears calm and comfortable and is in NAD Eyes:   normal lids, iris ENT:  grossly normal hearing, lips & tongue, mmm Cardiovascular:  RRR. No LE edema.  Respiratory:   CTA bilaterally with no wheezes/rales/rhonchi.  Normal respiratory effort. Abdomen:  gravid, obese, on toco Skin:  no rash or induration seen on limited exam Musculoskeletal:  grossly normal tone BUE/BLE, good ROM, no bony abnormality Psychiatric:  grossly normal mood and affect, speech fluent and appropriate, AOx3 Neurologic:  CN 2-12 grossly intact, moves all extremities in coordinated fashion  Data Reviewed: I have reviewed the patient's lab results since admission.  Pertinent labs for today include:   Cortisol 3.3 -> 9.7 K+ 3    Family  Communication: None present Primary team communication: Discussed with Dr. Emma Hardy Thank you very much for involving us  in the care of your patient.  Author: Lorita Rosa, MD 03/31/2024 7:37 AM  For on call review [email or web address].

## 2024-03-31 NOTE — Progress Notes (Signed)
 Patient ID: Beth [name], female   DOB: 1986-03-22, 38 y.o.   MRN: [id number] Pt reports feels well today. No headache, +FMs. Denies contractions   Today's Vitals   03/31/24 0810 03/31/24 0820 03/31/24 1023 03/31/24 1108  BP:  123/76    Pulse:  77    Resp:  18    Temp:  98.2 F (36.8 C)    TempSrc:  Oral    SpO2:  99%    Weight:      Height:      PainSc: 0-No pain  0-No pain 0-No pain   Body mass index is 49.65 kg/m.  GEN - NAD ABD - cw GA  EXT - no homans  SVE - deferred  NST - 130s, +accels, no decels , moderate variability TOCO - no contractions      Latest Ref Rng & Units 03/31/2024    5:36 AM 03/30/2024    4:40 PM 03/30/2024    5:04 AM  CMP  Glucose 70 - 99 mg/dL 90  87  87   BUN 6 - 20 mg/dL 5  5  5    Creatinine 0.44 - 1.00 mg/dL 7.82  9.56  2.13   Sodium 135 - 145 mmol/L 140  138  140   Potassium 3.5 - 5.1 mmol/L 3.0  3.1  2.8   Chloride 98 - 111 mmol/L 109  108  108   CO2 22 - 32 mmol/L 23  24  22    Calcium  8.9 - 10.3 mg/dL 08.6  57.8  46.9   Total Protein 6.5 - 8.1 g/dL  5.3    Total Bilirubin 0.0 - 1.2 mg/dL  0.7    Alkaline Phos 38 - 126 U/L  52    AST 15 - 41 U/L  19    ALT 0 - 44 U/L  15       A/P: 37yo [id number] female at 86 1/[redacted]weeks gestation on HD#8 admitted for mgmt of severe preE ; now with hypokalemia and anaemia of pregnancy   PreE w/ SF: s/p magnesium  sulfate, BP reasonably well controlled now on labetalol  600mg  TID. Check q 4hrs Preterm gestation:  s/p BMZ x 2. S/p NICU consult 6/1. Growth scan on 5/30: EFW 99% (2167g = 4lb12oz), repeat in 3 weeks. Order placed for 6/20 inpt if still here Hypokalemia, unclear etiology: Minimal response to repletion so far. Magnesium  level low normal to normal, urine K and Na normal. No emesis/diarrhea. Hospitalist consulted  on HD#4 - workup being conducted and managing replacement.  Appreciate recommendations. Patient reports uncle with potassium wasting disorder, which resulted in heart failure.  Iron  deficiency  anemia: hgb 9.1, ferritin 6. IV iron  5/30. Consider repeat infusion  HA resolved:  MRI completed HD#4, normal. Fioricet /tylenol  PRN,suspect due to Mg infusion vs correction of BP NST qshift MOD: Breech/transverse on 5/30: plan delivery at 34 weeks. Desires VBAC  if baby vertex. Has h/o SVD, followed by primary cesarean, followed by successful VBAC  pelvis proven to 10lbs. [name] MD 03/31/24 12:03 PM

## 2024-04-01 DIAGNOSIS — E038 Other specified hypothyroidism: Secondary | ICD-10-CM | POA: Diagnosis not present

## 2024-04-01 LAB — CBC
HCT: 30.3 % — ABNORMAL LOW (ref 36.0–46.0)
Hemoglobin: 9.3 g/dL — ABNORMAL LOW (ref 12.0–15.0)
MCH: 24.6 pg — ABNORMAL LOW (ref 26.0–34.0)
MCHC: 30.7 g/dL (ref 30.0–36.0)
MCV: 80.2 fL (ref 80.0–100.0)
Platelets: 200 10*3/uL (ref 150–400)
RBC: 3.78 MIL/uL — ABNORMAL LOW (ref 3.87–5.11)
RDW: 20.9 % — ABNORMAL HIGH (ref 11.5–15.5)
WBC: 8.2 10*3/uL (ref 4.0–10.5)
nRBC: 0 % (ref 0.0–0.2)

## 2024-04-01 LAB — ACTH STIMULATION, 3 TIME POINTS
Cortisol, 30 Min: 19.6 ug/dL
Cortisol, 60 Min: 22 ug/dL
Cortisol, Base: 8.2 ug/dL

## 2024-04-01 LAB — BASIC METABOLIC PANEL WITH GFR
Anion gap: 8 (ref 5–15)
BUN: <5 mg/dL — ABNORMAL LOW (ref 6–20)
CO2: 23 mmol/L (ref 22–32)
Calcium: 10.5 mg/dL — ABNORMAL HIGH (ref 8.9–10.3)
Chloride: 110 mmol/L (ref 98–111)
Creatinine, Ser: 0.54 mg/dL (ref 0.44–1.00)
GFR, Estimated: >60 mL/min (ref >60)
Glucose, Bld: 87 mg/dL (ref 70–99)
Potassium: 3.3 mmol/L — ABNORMAL LOW (ref 3.5–5.1)
Sodium: 141 mmol/L (ref 135–145)

## 2024-04-01 LAB — NA AND K (SODIUM & POTASSIUM), RAND UR
Potassium Urine: 29 mmol/L
Sodium, Ur: 79 mmol/L

## 2024-04-01 LAB — COMPREHENSIVE METABOLIC PANEL WITH GFR
ALT: 13 U/L (ref 0–44)
AST: 18 U/L (ref 15–41)
Albumin: 2.6 g/dL — ABNORMAL LOW (ref 3.5–5.0)
Alkaline Phosphatase: 59 U/L (ref 38–126)
Anion gap: 8 (ref 5–15)
BUN: <5 mg/dL — ABNORMAL LOW (ref 6–20)
CO2: 22 mmol/L (ref 22–32)
Calcium: 10.8 mg/dL — ABNORMAL HIGH (ref 8.9–10.3)
Chloride: 109 mmol/L (ref 98–111)
Creatinine, Ser: 0.6 mg/dL (ref 0.44–1.00)
GFR, Estimated: >60 mL/min (ref >60)
Glucose, Bld: 95 mg/dL (ref 70–99)
Potassium: 3.7 mmol/L (ref 3.5–5.1)
Sodium: 139 mmol/L (ref 135–145)
Total Bilirubin: 0.5 mg/dL (ref 0.0–1.2)
Total Protein: 5.7 g/dL — ABNORMAL LOW (ref 6.5–8.1)

## 2024-04-01 LAB — CREATININE, URINE, RANDOM: Creatinine, Urine: 45 mg/dL

## 2024-04-01 LAB — MAGNESIUM: Magnesium: 1.5 mg/dL — ABNORMAL LOW (ref 1.7–2.4)

## 2024-04-01 LAB — FERRITIN: Ferritin: 61 ng/mL (ref 11–307)

## 2024-04-01 MED ORDER — SODIUM CHLORIDE 0.9 % IV SOLN
500.0000 mg | Freq: Once | INTRAVENOUS | Status: AC
  Administered 2024-04-01: 500 mg via INTRAVENOUS
  Filled 2024-04-01: qty 25

## 2024-04-01 MED ORDER — MAGNESIUM OXIDE -MG SUPPLEMENT 400 (240 MG) MG PO TABS
400.0000 mg | ORAL_TABLET | Freq: Three times a day (TID) | ORAL | Status: DC
  Administered 2024-04-01 (×3): 400 mg via ORAL
  Filled 2024-04-01 (×3): qty 1

## 2024-04-01 MED ORDER — SODIUM CHLORIDE 0.9 % IV SOLN: INTRAVENOUS | Status: AC | PRN

## 2024-04-01 MED ORDER — LABETALOL HCL 200 MG PO TABS
800.0000 mg | ORAL_TABLET | Freq: Three times a day (TID) | ORAL | Status: DC
  Administered 2024-04-01 – 2024-04-05 (×11): 800 mg via ORAL
  Filled 2024-04-01 (×11): qty 4

## 2024-04-01 NOTE — Progress Notes (Signed)
 Brief Update Note  Vitals and labs reviewed.   -hypoK: now resolved! Continue PO replacement per Medicine. -pre-E: repeated labs today, all wnl. However, multiple elevated blood pressures today. Continues to be asymptomatic (this morning's mild headache resolved with fioricet ). Will uptitrate to max labetalol  (800mg  TID) in hopes of good BP control. Upon discussion with MFM Dr. Grayland Le, due to need to uptitrate to max dose of labetalol , plan for continued inpatient monitoring through delivery. -anemia: Hgb 9.1-> 9.3, ferritin 6->61 after 1x iron  infusion. 2nd iron  infusion given this AM.   Discussed above findings and plans with patient. All questions answered.

## 2024-04-01 NOTE — Progress Notes (Signed)
 Patient ID: Beth [name], female   DOB: 09/20/1986, 38 y.o.   MRN: [id number] Pt reports feels well today. +FMs. Denies contractions, vaginal bleeding, or LOF. Mild headache, 2/10, just took fioricet  for treatment. Denies vision changes, CP, SOB, and RUQ pain.     04/01/2024    9:37 AM 04/01/2024    6:44 AM 04/01/2024    6:42 AM  Vitals with BMI  Systolic 142 136 829  Diastolic 73 62 62  Pulse 77 71 71     Physical Exam:  General: no acute distress Pulm: normal work of breathing on room air Card: well perfused MSK: normal ROM Neuro: no focal deficits, oriented x3 Psych: normal mood, normal thought SVE - deferred  NST - 135 baseline, +accels, no decels , moderate variability TOCO - no contractions      Latest Ref Rng & Units 04/01/2024    6:30 AM 03/31/2024    5:36 AM 03/30/2024    4:40 PM  CMP  Glucose 70 - 99 mg/dL 87  90  87   BUN 6 - 20 mg/dL <5  <5  5   Creatinine 0.44 - 1.00 mg/dL 5.62  1.30  8.65   Sodium 135 - 145 mmol/L 141  140  138   Potassium 3.5 - 5.1 mmol/L 3.3  3.0  3.1   Chloride 98 - 111 mmol/L 110  109  108   CO2 22 - 32 mmol/L 23  23  24    Calcium  8.9 - 10.3 mg/dL 78.4  69.6  29.5   Total Protein 6.5 - 8.1 g/dL   5.3   Total Bilirubin 0.0 - 1.2 mg/dL   0.7   Alkaline Phos 38 - 126 U/L   52   AST 15 - 41 U/L   19   ALT 0 - 44 U/L   15      A/P: 29UX [id number] female at 69 2/[redacted]weeks gestation on HD#9 admitted for mgmt of severe preE ; now with hypokalemia and anemia of pregnancy   PreE w/ SF: Diagnosed based on severe range blood pressures. s/p magnesium  sulfate. BP reasonably well controlled now on labetalol  600mg  TID. Check q 4hrs. Most recent AST/ALT and platelets normal on 6/4, will repeat this AM. Cr wnl this AM. Patient with mild 2/10 headache, not yet treated, otherwise asymptomatic. Preterm gestation:  s/p BMZ x 2 (5/29&5/30), consider rescue BMZ 48 hours prior to delivery. S/p NICU consult 6/1. Growth scan on 5/30: EFW 99% (2167g = 4lb12oz). Order placed  for 6/20 inpt if still here Hypokalemia and hypomagnesemia, unclear etiology: K+3.3 and Mag 1.5 this AM after significant repletion, currently receiving PO mag and PO K+ replacement. Will continue inpatient admission in attempt to reach normal K+ and Mag by PO repletion. No emesis/diarrhea. Hospitalist consulted  on HD#4 - workup being conducted and managing replacement.  Appreciate recommendations. Patient reports uncle with potassium wasting disorder, which resulted in heart failure.  Iron  deficiency anemia: hgb 9.1 (6/4), ferritin 6 (5/30). Received IV iron  5/30. Every other day PO iron  and repeat infusion ordered for today. Repeat CBC and ferritin ordered this AM. HA resolved:  MRI completed HD#4, normal. Fioricet /tylenol  PRN, suspect due to Mg infusion vs correction of BP NST qshift MOD: Breech/transverse on 5/30: plan delivery at 34 weeks. Desires VBAC  if baby vertex. Has h/o SVD, followed by primary cesarean, followed by successful VBAC  pelvis proven to 10lbs. Alroy Jericho, MD 04/01/24 8:58 AM

## 2024-04-01 NOTE — Consult Note (Signed)
 Initial Consultation Note   Patient: Beth [name] WUJ:[id number] DOB: 1985-11-05 PCP: Patient, No Pcp Per DOA: 03/24/2024 DOS: the patient was seen and examined on 04/01/2024 Primary service: [name], MD  Referring physician: Avanell Bob Reason for consult: Hypokalemia   Assessment and Plan:  Preeclampsia Per Ob/gyn BP improving, now on labetalol  Aiming for EGA [redacted] weeks prior to delivery   Hypokalemia Acute and refractory Potassium still noted to be low at 3 today Spot urinary potassium is appropriate and appears to rule out renal potassium wasting disorder May also be associated with poor absorption related to hypomagnesemia Cortisol level low-normal, cosyntropin stimulation test with cortisol >20 at 1 hour (22) - still low-normal Mag++ has remained low and is now repleted with 400 mg TID K+ supplementation is now at 40 mEq QID Will need close outpatient f/u with labs at least twice weekly Will refer to endocrinology as an outpatient Given normalization of K+ (finally!), she is likely ok for dc today or tomorrow with very close outpatient f/u     [facility] will continue to follow the patient.     HPI: Beth [name] is a 38 y.o. female G4P3 at 31+1 with past medical history of anxiety who presented on 5/29 with hypokalemia.  + FH of renal potassium wasting d/o.  Working diagnosis is poor absorption related to hypomagnesemia.  She is feeling ok but Mag++ gives her a headache.  She reports that if K+/Mag++ and symptoms can be controlled, she may be discharged with plan for delivery at 10 weeks' gestation; otherwise, she will remain hospitalized until delivery.  She has never had these issues with prior 3 pregnancies.   Review of Systems: As mentioned in the history of present illness. All other systems reviewed and are negative.  Past Medical History:  Diagnosis Date   Anemia    Anxiety    Bulging lumbar disc    Headache    otc med prn - last one 4 months ago   History of blood  transfusion    after 04/02/2016 svd at [facility]   Seizures (HCC)    Childhood   SVD (spontaneous vaginal delivery)    x 1   Past Surgical History:  Procedure Laterality Date   CESAREAN SECTION N/A 05/26/2017   Procedure: CESAREAN SECTION;  Surgeon: Artemisa Bile, MD;  Location: [facility] BIRTHING SUITES;  Service: Obstetrics;  Laterality: N/A;   DILATION AND CURETTAGE OF UTERUS N/A 06/12/2016   Procedure: DILATATION AND CURETTAGE;  Surgeon: Artemisa Bile, MD;  Location: [facility] ORS;  Service: Gynecology;  Laterality: N/A;   TONSILLECTOMY     WISDOM TOOTH EXTRACTION     Social History:  reports that she has never smoked. She has never used smokeless tobacco. She reports that she does not drink alcohol and does not use drugs.  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    Penicillins Rash and Other (See Comments)    Has patient had a PCN reaction causing immediate rash, facial/tongue/throat swelling, SOB or lightheadedness with hypotension: No Has patient had a PCN reaction causing severe rash involving mucus membranes or skin necrosis: No Has patient had a PCN reaction that required hospitalization: No Has patient had a PCN reaction occurring within the last 10 years: Yes If all of the above answers are "NO", then may proceed with Cephalosporin use.    Strawberry (Diagnostic) Itching    Family History  Problem Relation Age of Onset  Dementia Mother    Hypertension Other    Cancer Other     Prior to Admission medications   Medication Sig Start Date End Date Taking? Authorizing Provider  ibuprofen  (ADVIL ) 800 MG tablet Take 1 tablet (800 mg total) by mouth every 8 (eight) hours as needed. Patient not taking: Reported on 03/24/2024 05/20/21   Shivaji, Martin Slay, MD  Iron -FA-B Cmp-C-Biot-Probiotic (FUSION PLUS) CAPS Take 1 capsule by mouth daily.    [provider]  NIFEdipine  (ADALAT  CC) 60 MG 24 hr tablet Take 1 tablet (60 mg total) by  mouth in the morning and at bedtime. Patient not taking: Reported on 03/24/2024 05/20/21   Shivaji, Martin Slay, MD  Prenatal MV-Min-FA-Omega-3 (PRENATAL GUMMIES/DHA & FA) 0.4-32.5 MG CHEW Chew 2 each by mouth daily.    [provider]  tirzepatide  (ZEPBOUND ) 7.5 MG/0.5ML Pen Inject 7.5 mg into the skin every 7 (seven) days. Patient not taking: Reported on 03/24/2024 12/10/22       Physical Exam: Vitals:   03/31/24 2249 04/01/24 0338 04/01/24 0642 04/01/24 0644  BP: (!) 151/67 125/72 136/62 136/62  Pulse: 77 69 71 71  Resp: 17 (P) 17 17   Temp: 98.3 F (36.8 C) (P) 98 F (36.7 C) 98.3 F (36.8 C)   TempSrc: Oral (P) Oral Oral   SpO2: 99% (P) 98% 97%   Weight:      Height:         Subjective: Feeling fine.  Less blue today.  No concerns.   No intake or output data in the 24 hours ending 04/01/24 0740 Filed Weights   03/24/24 1417  Weight: (!) 161.5 kg    Exam:  General:  Appears calm and comfortable and is in NAD Eyes:   normal lids, iris ENT:  grossly normal hearing, lips & tongue, mmm Cardiovascular:  RRR. No LE edema.  Respiratory:   CTA bilaterally with no wheezes/rales/rhonchi.  Normal respiratory effort. Abdomen:  gravid, obese, on toco Skin:  no rash or induration seen on limited exam Musculoskeletal:  grossly normal tone BUE/BLE, good ROM, no bony abnormality Psychiatric:  grossly normal mood and affect, speech fluent and appropriate, AOx3 Neurologic:  CN 2-12 grossly intact, moves all extremities in coordinated fashion  Data Reviewed: I have reviewed the patient's lab results since admission.  Pertinent labs for today include:   K+ 3.3 -> 3.7 Ca++ 10.5 Mag++ 1.5    Family Communication: None present Primary team communication: I have been in touch with Dr. Maurie Southern via Secure Chat Thank you very much for involving us  in the care of your patient.  Author: Lorita Rosa, MD 04/01/2024 7:39 AM  For on call review [email or web address].

## 2024-04-01 NOTE — Plan of Care (Signed)
  Problem: Health Behavior/Discharge Planning: Goal: Ability to manage health-related needs will improve Outcome: Progressing   Problem: Clinical Measurements: Goal: Ability to maintain clinical measurements within normal limits will improve Outcome: Progressing Goal: Will remain free from infection Outcome: Progressing Goal: Diagnostic test results will improve Outcome: Progressing Goal: Respiratory complications will improve Outcome: Progressing   Problem: Activity: Goal: Risk for activity intolerance will decrease Outcome: Progressing   Problem: Nutrition: Goal: Adequate nutrition will be maintained Outcome: Progressing   Problem: Coping: Goal: Level of anxiety will decrease Outcome: Progressing   Problem: Elimination: Goal: Will not experience complications related to bowel motility Outcome: Progressing   Problem: Pain Managment: Goal: General experience of comfort will improve and/or be controlled Outcome: Progressing   Problem: Safety: Goal: Ability to remain free from injury will improve Outcome: Progressing   Problem: Education: Goal: Knowledge of the prescribed therapeutic regimen will improve Outcome: Progressing   Problem: Fluid Volume: Goal: Peripheral tissue perfusion will improve Outcome: Progressing

## 2024-04-02 DIAGNOSIS — E876 Hypokalemia: Secondary | ICD-10-CM | POA: Diagnosis not present

## 2024-04-02 LAB — COMPREHENSIVE METABOLIC PANEL WITH GFR
ALT: 10 U/L (ref 0–44)
AST: 15 U/L (ref 15–41)
Albumin: 2.3 g/dL — ABNORMAL LOW (ref 3.5–5.0)
Alkaline Phosphatase: 51 U/L (ref 38–126)
Anion gap: 7 (ref 5–15)
BUN: <5 mg/dL — ABNORMAL LOW (ref 6–20)
CO2: 22 mmol/L (ref 22–32)
Calcium: 10 mg/dL (ref 8.9–10.3)
Chloride: 107 mmol/L (ref 98–111)
Creatinine, Ser: 0.68 mg/dL (ref 0.44–1.00)
GFR, Estimated: >60 mL/min (ref >60)
Glucose, Bld: 92 mg/dL (ref 70–99)
Potassium: 3.6 mmol/L (ref 3.5–5.1)
Sodium: 136 mmol/L (ref 135–145)
Total Bilirubin: 0.7 mg/dL (ref 0.0–1.2)
Total Protein: 5.3 g/dL — ABNORMAL LOW (ref 6.5–8.1)

## 2024-04-02 LAB — CBC
HCT: 28 % — ABNORMAL LOW (ref 36.0–46.0)
Hemoglobin: 8.5 g/dL — ABNORMAL LOW (ref 12.0–15.0)
MCH: 24.3 pg — ABNORMAL LOW (ref 26.0–34.0)
MCHC: 30.4 g/dL (ref 30.0–36.0)
MCV: 80 fL (ref 80.0–100.0)
Platelets: 181 10*3/uL (ref 150–400)
RBC: 3.5 MIL/uL — ABNORMAL LOW (ref 3.87–5.11)
RDW: 20.8 % — ABNORMAL HIGH (ref 11.5–15.5)
WBC: 7.7 10*3/uL (ref 4.0–10.5)
nRBC: 0 % (ref 0.0–0.2)

## 2024-04-02 LAB — TYPE AND SCREEN
ABO/RH(D): O POS
Antibody Screen: NEGATIVE

## 2024-04-02 LAB — MAGNESIUM: Magnesium: 1.6 mg/dL — ABNORMAL LOW (ref 1.7–2.4)

## 2024-04-02 LAB — ALDOSTERONE + RENIN ACTIVITY W/ RATIO
ALDO / PRA Ratio: <1 (ref 0.0–30.0)
Aldosterone: <1 ng/dL (ref 0.0–30.0)
PRA LC/MS/MS: 1.007 ng/mL/h (ref 0.167–5.380)

## 2024-04-02 MED ORDER — ORAL CARE MOUTH RINSE: 15.0000 mL | OROMUCOSAL | Status: DC | PRN

## 2024-04-02 MED ORDER — FAMOTIDINE 20 MG PO TABS
20.0000 mg | ORAL_TABLET | Freq: Two times a day (BID) | ORAL | Status: DC
  Administered 2024-04-02 – 2024-04-19 (×33): 20 mg via ORAL
  Filled 2024-04-02 (×37): qty 1

## 2024-04-02 MED ORDER — MAGNESIUM OXIDE -MG SUPPLEMENT 400 (240 MG) MG PO TABS
400.0000 mg | ORAL_TABLET | Freq: Four times a day (QID) | ORAL | Status: DC
  Administered 2024-04-02 – 2024-04-19 (×64): 400 mg via ORAL
  Filled 2024-04-02 (×74): qty 1

## 2024-04-02 NOTE — Consult Note (Signed)
 Initial Consultation Note   Patient: Beth [name] [id number]:[id number] DOB: 1986-04-03 PCP: Patient, No Pcp Per DOA: 03/24/2024 DOS: the patient was seen and examined on 04/02/2024 Primary service: [name], MD  Referring physician: Avanell Bob Reason for consult: Hypokalemia   Assessment and Plan:  Preeclampsia Per Ob/gyn Unfortunately, this issue worsened yesterday and she is on max labetaloll and will need to remain hospitalized until delivery Aiming for EGA [redacted] weeks prior to delivery Management per OB   Hypokalemia Acute and refractory Potassium has finally corrected! Spot urinary potassium is appropriate and appears to rule out renal potassium wasting disorder May also be associated with poor absorption related to hypomagnesemia Cortisol level low-normal, cosyntropin  stimulation test with cortisol >20 at 1 hour (22) - still low-normal Mag++ has remained low and is now repleted with 400 mg QID K+ supplementation is now at 40 mEq QID Will need ongoing close monitoring with likely daily labs while hospitalized If this is related to pseudohyperaldosteronism from pregnancy, it may resolve after delivery Will refer to endocrinology as an outpatient Given normalization of K+ (finally!), TRH will sign off Should electrolytes become abnormal again or other new medical problems, please re-consult     Thank you for the opportunity to co-manage this patient.  TRH will sign off at this time.       HPI: Beth [name] is a 38 y.o. female G4P3 at 31+1 with past medical history of anxiety who presented on 5/29 with hypokalemia.  + FH of renal potassium wasting d/o.  Working diagnosis is poor absorption related to hypomagnesemia.  She is feeling ok but Mag++ gives her a headache.  She reports that if K+/Mag++ and symptoms can be controlled, she may be discharged with plan for delivery at 58 weeks' gestation; otherwise, she will remain hospitalized until delivery.  She has never had these issues with  prior 3 pregnancies.   Review of Systems: As mentioned in the history of present illness. All other systems reviewed and are negative.     Past Medical History:  Diagnosis Date   Anemia    Anxiety    Bulging lumbar disc    Headache    otc med prn - last one 4 months ago   History of blood transfusion    after 04/02/2016 svd at [facility]   Seizures (HCC)    Childhood   SVD (spontaneous vaginal delivery)    x 1   Past Surgical History:  Procedure Laterality Date   CESAREAN SECTION N/A 05/26/2017   Procedure: CESAREAN SECTION;  Surgeon: Artemisa Bile, MD;  Location: [facility] BIRTHING SUITES;  Service: Obstetrics;  Laterality: N/A;   DILATION AND CURETTAGE OF UTERUS N/A 06/12/2016   Procedure: DILATATION AND CURETTAGE;  Surgeon: Artemisa Bile, MD;  Location: [facility] ORS;  Service: Gynecology;  Laterality: N/A;   TONSILLECTOMY     WISDOM TOOTH EXTRACTION     Social History:  reports that she has never smoked. She has never used smokeless tobacco. She reports that she does not drink alcohol and does not use drugs.  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    Penicillins Rash and Other (See Comments)    Has patient had a PCN reaction causing immediate rash, facial/tongue/throat swelling, SOB or lightheadedness with hypotension: No Has patient had a PCN reaction causing severe rash involving mucus membranes or skin necrosis: No Has patient had a PCN reaction that required hospitalization: No Has patient had  a PCN reaction occurring within the last 10 years: Yes If all of the above answers are "NO", then may proceed with Cephalosporin use.    Strawberry (Diagnostic) Itching    Family History  Problem Relation Age of Onset   Dementia Mother    Hypertension Other    Cancer Other     Prior to Admission medications   Medication Sig Start Date End Date Taking? Authorizing Provider  ibuprofen  (ADVIL ) 800 MG tablet Take 1 tablet (800 mg total) by  mouth every 8 (eight) hours as needed. Patient not taking: Reported on 03/24/2024 05/20/21   Shivaji, Martin Slay, MD  Iron -FA-B Cmp-C-Biot-Probiotic (FUSION PLUS) CAPS Take 1 capsule by mouth daily.    [provider]  NIFEdipine  (ADALAT  CC) 60 MG 24 hr tablet Take 1 tablet (60 mg total) by mouth in the morning and at bedtime. Patient not taking: Reported on 03/24/2024 05/20/21   Shivaji, Martin Slay, MD  Prenatal MV-Min-FA-Omega-3 (PRENATAL GUMMIES/DHA & FA) 0.4-32.5 MG CHEW Chew 2 each by mouth daily.    [provider]  tirzepatide  (ZEPBOUND ) 7.5 MG/0.5ML Pen Inject 7.5 mg into the skin every 7 (seven) days. Patient not taking: Reported on 03/24/2024 12/10/22       Physical Exam: Vitals:   04/01/24 1936 04/01/24 1939 04/01/24 2346 04/02/24 0445  BP: (!) 145/73  126/66 128/71  Pulse: 74  73 66  Resp: 18  16 18   Temp:  98.4 F (36.9 C) 97.8 F (36.6 C) 97.8 F (36.6 C)  TempSrc:  Axillary Oral Oral  SpO2:   100%   Weight:      Height:         Subjective: Feels fine.  Wants the baby to "cook" as long as possible so she is content to remain hospitalized for the duration of the pregnancy.    Intake/Output Summary (Last 24 hours) at 04/02/2024 0717 Last data filed at 04/01/2024 1750 Gross per 24 hour  Intake 66.02 ml  Output --  Net 66.02 ml   Filed Weights   03/24/24 1417  Weight: (!) 161.5 kg    Exam:  General:  Appears calm and comfortable and is in NAD Eyes:   normal lids, iris ENT:  grossly normal hearing, lips & tongue, mmm Cardiovascular:  RRR. No LE edema.  Respiratory:   CTA bilaterally with no wheezes/rales/rhonchi.  Normal respiratory effort. Abdomen:  gravid, obese, on toco Skin:  no rash or induration seen on limited exam Musculoskeletal:  grossly normal tone BUE/BLE, good ROM, no bony abnormality Psychiatric:  grossly normal mood and affect, speech fluent and appropriate, AOx3 Neurologic:  CN 2-12 grossly intact, moves all extremities in coordinated  fashion  Data Reviewed: I have reviewed the patient's lab results since admission.  Pertinent labs for today include:   K+ 3.6 Mag++ 1.6 Albumin 2.3 WBC 7.7 Hgb 8.5    Family Communication: None present  Thank you very much for involving us  in the care of your patient.  Author: Lorita Rosa, MD 04/02/2024 7:16 AM  For on call review [email or web address].

## 2024-04-02 NOTE — Plan of Care (Signed)

## 2024-04-02 NOTE — Plan of Care (Signed)
  Problem: Health Behavior/Discharge Planning: Goal: Ability to manage health-related needs will improve Outcome: Progressing   Problem: Clinical Measurements: Goal: Ability to maintain clinical measurements within normal limits will improve Outcome: Progressing Goal: Will remain free from infection Outcome: Progressing Goal: Diagnostic test results will improve Outcome: Progressing Goal: Respiratory complications will improve Outcome: Progressing Goal: Cardiovascular complication will be avoided Outcome: Progressing   Problem: Activity: Goal: Risk for activity intolerance will decrease Outcome: Progressing   Problem: Nutrition: Goal: Adequate nutrition will be maintained Outcome: Progressing   Problem: Coping: Goal: Level of anxiety will decrease Outcome: Progressing   Problem: Elimination: Goal: Will not experience complications related to bowel motility Outcome: Progressing Goal: Will not experience complications related to urinary retention Outcome: Progressing   Problem: Pain Managment: Goal: General experience of comfort will improve and/or be controlled Outcome: Progressing   Problem: Safety: Goal: Ability to remain free from injury will improve Outcome: Progressing   Problem: Skin Integrity: Goal: Risk for impaired skin integrity will decrease Outcome: Progressing   Problem: Education: Goal: Knowledge of disease or condition will improve Outcome: Progressing Goal: Knowledge of the prescribed therapeutic regimen will improve Outcome: Progressing Goal: Individualized Educational Video(s) Outcome: Progressing   Problem: Clinical Measurements: Goal: Complications related to the disease process, condition or treatment will be avoided or minimized Outcome: Progressing   Problem: Education: Goal: Knowledge of disease or condition will improve Outcome: Progressing Goal: Knowledge of the prescribed therapeutic regimen will improve Outcome: Progressing    Problem: Fluid Volume: Goal: Peripheral tissue perfusion will improve Outcome: Progressing   Problem: Clinical Measurements: Goal: Complications related to disease process, condition or treatment will be avoided or minimized Outcome: Progressing

## 2024-04-02 NOTE — Progress Notes (Signed)
 Patient ID: Beth [name], female   DOB: [birth year], 38 y.o.   MRN: [id number]  Hospital day #33  38 year old G4 P3-0-0-3 at 31+3 admitted with severe preeclampsia  S: Patient feeling well today with no complaints.  Active fetal movement, no leaking fluid or bleeding.  No contractions.  Denies headache  O: Vitals:   04/01/24 2346 04/02/24 0445 04/02/24 0806 04/02/24 1150  BP: 126/66 128/71 (!) 142/72 133/65  Pulse: 73 66 72 68  Resp: 16 18 17 18   Temp: 97.8 F (36.6 C) 97.8 F (36.6 C) 98.1 F (36.7 C) 97.9 F (36.6 C)  TempSrc: Oral Oral Oral Oral  SpO2: 100%  100% 99%  Weight:      Height:       Alert and orient x 3, no apparent distress Gravid, soft, nontender Fetal heart rate 140s, reactive, 1 tracing Cervix deferred Toco quiet  Results for orders placed or performed during the hospital encounter of 03/24/24 (from the past 24 hours)  Type and screen [facility]     Status: None   Collection Time: 04/02/24  5:26 AM  Result Value Ref Range   ABO/RH(D) O POS    Antibody Screen NEG    Sample Expiration      04/05/2024,2359 Performed at [facility], [address], [place], [place] [address]   Magnesium      Status: Abnormal   Collection Time: 04/02/24  5:29 AM  Result Value Ref Range   Magnesium  1.6 (L) 1.7 - 2.4 mg/dL  Comprehensive metabolic panel     Status: Abnormal   Collection Time: 04/02/24  5:29 AM  Result Value Ref Range   Sodium 136 135 - 145 mmol/L   Potassium 3.6 3.5 - 5.1 mmol/L   Chloride 107 98 - 111 mmol/L   CO2 22 22 - 32 mmol/L   Glucose, Bld 92 70 - 99 mg/dL   BUN <5 (L) 6 - 20 mg/dL   Creatinine, Ser 2.53 0.44 - 1.00 mg/dL   Calcium  10.0 8.9 - 10.3 mg/dL   Total Protein 5.3 (L) 6.5 - 8.1 g/dL   Albumin 2.3 (L) 3.5 - 5.0 g/dL   AST 15 15 - 41 U/L   ALT 10 0 - 44 U/L   Alkaline Phosphatase 51 38 - 126 U/L   Total Bilirubin 0.7 0.0 - 1.2 mg/dL   GFR, Estimated >66 >44 mL/min   Anion gap 7 5 - 15  CBC     Status:  Abnormal   Collection Time: 04/02/24  5:29 AM  Result Value Ref Range   WBC 7.7 4.0 - 10.5 K/uL   RBC 3.50 (L) 3.87 - 5.11 MIL/uL   Hemoglobin 8.5 (L) 12.0 - 15.0 g/dL   HCT 03.4 (L) 74.2 - 59.5 %   MCV 80.0 80.0 - 100.0 fL   MCH 24.3 (L) 26.0 - 34.0 pg   MCHC 30.4 30.0 - 36.0 g/dL   RDW 63.8 (H) 75.6 - 43.3 %   Platelets 181 150 - 400 K/uL   nRBC 0.0 0.0 - 0.2 %   Assessment and plan: 38 year old G4 P3-0-0-3 at 31+3 with severe preeclampsia 1) Preeclampsia: Blood pressures in the mild range.  Currently on labetalol  800 mg 3 times daily.  Patient has a history of severe headaches with Procardia  therefore labetalol  was the medication of choice for antihypertensive.  Labetalol  was titrated to maximum dose yesterday. - Continue inpatient management through delivery - Given severe preeclampsia will proceed with delivery at 34  weeks - Status post course of betamethasone  on 5/29 and 5/30.  Plan rescue course of betamethasone  at 33+5 and 33+6  2) Hypokalemia: Potassium now in normal range on potassium 40 mEq 4 times daily.  Hospitalist service assisted with management, appreciate their assistance. -Potassium 3.6 today -From hospitalist consult note 6/6: Spot urinary potassium is appropriate and appears to rule out renal potassium wasting disorder May also be associated with poor absorption related to hypomagnesemia Cortisol level low-normal, cosyntropin  stimulation test with cortisol >20 at 1 hour (22) - still low-normal Mag++ has remained low and is now repleted with 400 mg QID K+ supplementation is now at 40 mEq QID Will need ongoing close monitoring with likely daily labs while hospitalized If this is related to pseudohyperaldosteronism from pregnancy, it may resolve after delivery Will refer to endocrinology as an outpatient Given normalization of K+ (finally!), TRH will sign off Should electrolytes become abnormal again or other new medical problems, please re-consult  3)  Hypomagnesia: Patient on magnesium  oxide 400 mg 4 times daily -Magnesium  1.6 today  4) Anemia of pregnancy: Status post IV iron  x 2.  First infusion on 5/30, second infusion 6/6    Latest Ref Rng & Units 04/02/2024    5:29 AM 04/01/2024   10:13 AM 03/30/2024    5:04 AM  CBC  WBC 4.0 - 10.5 K/uL 7.7  8.2  8.6   Hemoglobin 12.0 - 15.0 g/dL 8.5  9.3  9.1   Hematocrit 36.0 - 46.0 % 28.0  30.3  30.6   Platelets 150 - 400 K/uL 181  200  233   - Ferritin 6 on admission, 61 on 6/6 - Discussed possibility of need for blood transfusion in emergency situation  5) History of persistent headache: Has been having persistent headaches since early in the pregnancy.  Saw neurology in consultation on the day of admission for persistent headache.  Now resolved.  Status post normal MRI of brain on hospital day #4. - Tylenol  and Fioricet  as needed  6) MOD: Desires VBAC.  History of 2 prior successful vaginal deliveries 1 of which was a VBAC.  Pelvis proven to 10 pounds.  Baby breech/transverse on last imaging study

## 2024-04-03 LAB — CBC
HCT: 28.7 % — ABNORMAL LOW (ref 36.0–46.0)
Hemoglobin: 8.8 g/dL — ABNORMAL LOW (ref 12.0–15.0)
MCH: 24.6 pg — ABNORMAL LOW (ref 26.0–34.0)
MCHC: 30.7 g/dL (ref 30.0–36.0)
MCV: 80.2 fL (ref 80.0–100.0)
Platelets: 166 10*3/uL (ref 150–400)
RBC: 3.58 MIL/uL — ABNORMAL LOW (ref 3.87–5.11)
RDW: 21.1 % — ABNORMAL HIGH (ref 11.5–15.5)
WBC: 8.4 10*3/uL (ref 4.0–10.5)
nRBC: 0 % (ref 0.0–0.2)

## 2024-04-03 LAB — COMPREHENSIVE METABOLIC PANEL WITH GFR
ALT: 12 U/L (ref 0–44)
AST: 15 U/L (ref 15–41)
Albumin: 2.4 g/dL — ABNORMAL LOW (ref 3.5–5.0)
Alkaline Phosphatase: 52 U/L (ref 38–126)
Anion gap: 5 (ref 5–15)
BUN: 5 mg/dL — ABNORMAL LOW (ref 6–20)
CO2: 23 mmol/L (ref 22–32)
Calcium: 10.6 mg/dL — ABNORMAL HIGH (ref 8.9–10.3)
Chloride: 110 mmol/L (ref 98–111)
Creatinine, Ser: 0.54 mg/dL (ref 0.44–1.00)
GFR, Estimated: >60 mL/min (ref >60)
Glucose, Bld: 85 mg/dL (ref 70–99)
Potassium: 3.8 mmol/L (ref 3.5–5.1)
Sodium: 138 mmol/L (ref 135–145)
Total Bilirubin: 0.5 mg/dL (ref 0.0–1.2)
Total Protein: 5.2 g/dL — ABNORMAL LOW (ref 6.5–8.1)

## 2024-04-03 LAB — MAGNESIUM: Magnesium: 1.5 mg/dL — ABNORMAL LOW (ref 1.7–2.4)

## 2024-04-03 NOTE — Plan of Care (Signed)
  Problem: Health Behavior/Discharge Planning: Goal: Ability to manage health-related needs will improve Outcome: Progressing   Problem: Clinical Measurements: Goal: Ability to maintain clinical measurements within normal limits will improve Outcome: Progressing Goal: Will remain free from infection Outcome: Progressing Goal: Diagnostic test results will improve Outcome: Progressing Goal: Respiratory complications will improve Outcome: Progressing Goal: Cardiovascular complication will be avoided Outcome: Progressing   Problem: Activity: Goal: Risk for activity intolerance will decrease Outcome: Progressing   Problem: Nutrition: Goal: Adequate nutrition will be maintained Outcome: Progressing   Problem: Coping: Goal: Level of anxiety will decrease Outcome: Progressing   Problem: Elimination: Goal: Will not experience complications related to bowel motility Outcome: Progressing Goal: Will not experience complications related to urinary retention Outcome: Progressing   Problem: Pain Managment: Goal: General experience of comfort will improve and/or be controlled Outcome: Progressing   Problem: Safety: Goal: Ability to remain free from injury will improve Outcome: Progressing   Problem: Skin Integrity: Goal: Risk for impaired skin integrity will decrease Outcome: Progressing   Problem: Education: Goal: Knowledge of disease or condition will improve Outcome: Progressing Goal: Knowledge of the prescribed therapeutic regimen will improve Outcome: Progressing Goal: Individualized Educational Video(s) Outcome: Progressing   Problem: Clinical Measurements: Goal: Complications related to the disease process, condition or treatment will be avoided or minimized Outcome: Progressing   Problem: Education: Goal: Knowledge of disease or condition will improve Outcome: Progressing Goal: Knowledge of the prescribed therapeutic regimen will improve Outcome: Progressing    Problem: Fluid Volume: Goal: Peripheral tissue perfusion will improve Outcome: Progressing   Problem: Clinical Measurements: Goal: Complications related to disease process, condition or treatment will be avoided or minimized Outcome: Progressing

## 2024-04-03 NOTE — Progress Notes (Addendum)
 Patient ID: Beth [name], female   DOB: 04-02-1986, 38 y.o.   MRN: [id number]  Hospital day #32  38 year old G4 P3-0-0-3 at 31+4 admitted with severe preeclampsia  S: Patient doing well, no complaints.  Active fetal movement, no vaginal bleeding or leaking.  Denies headaches.  No concerns  O: Vitals:   04/02/24 2025 04/02/24 2324 04/03/24 0600 04/03/24 0834  BP: 129/70 136/66 121/60 139/71  Pulse: 73 67 60 73  Resp: 18 18 19 18   Temp: 98.6 F (37 C) 98 F (36.7 C) 98.3 F (36.8 C) 98.2 F (36.8 C)  TempSrc: Oral Oral Oral Oral  SpO2: 98%   97%  Weight:      Height:       Alert and orient x 3, no apparent distress Gravid, soft, nontender Fetal heart rate 140s, reactive, 1 tracing Cervix deferred Toco quiet  Results for orders placed or performed during the hospital encounter of 03/24/24 (from the past 24 hours)  Magnesium      Status: Abnormal   Collection Time: 04/03/24  4:36 AM  Result Value Ref Range   Magnesium  1.5 (L) 1.7 - 2.4 mg/dL  Comprehensive metabolic panel     Status: Abnormal   Collection Time: 04/03/24  4:36 AM  Result Value Ref Range   Sodium 138 135 - 145 mmol/L   Potassium 3.8 3.5 - 5.1 mmol/L   Chloride 110 98 - 111 mmol/L   CO2 23 22 - 32 mmol/L   Glucose, Bld 85 70 - 99 mg/dL   BUN 5 (L) 6 - 20 mg/dL   Creatinine, Ser 9.51 0.44 - 1.00 mg/dL   Calcium  10.6 (H) 8.9 - 10.3 mg/dL   Total Protein 5.2 (L) 6.5 - 8.1 g/dL   Albumin 2.4 (L) 3.5 - 5.0 g/dL   AST 15 15 - 41 U/L   ALT 12 0 - 44 U/L   Alkaline Phosphatase 52 38 - 126 U/L   Total Bilirubin 0.5 0.0 - 1.2 mg/dL   GFR, Estimated >88 >41 mL/min   Anion gap 5 5 - 15  CBC     Status: Abnormal   Collection Time: 04/03/24  4:36 AM  Result Value Ref Range   WBC 8.4 4.0 - 10.5 K/uL   RBC 3.58 (L) 3.87 - 5.11 MIL/uL   Hemoglobin 8.8 (L) 12.0 - 15.0 g/dL   HCT 66.0 (L) 63.0 - 16.0 %   MCV 80.2 80.0 - 100.0 fL   MCH 24.6 (L) 26.0 - 34.0 pg   MCHC 30.7 30.0 - 36.0 g/dL   RDW 10.9 (H) 32.3 - 55.7 %    Platelets 166 150 - 400 K/uL   nRBC 0.0 0.0 - 0.2 %   Assessment and plan: 38 year old G4 P3-0-0-3 at 31+4 with severe preeclampsia 1) Preeclampsia: Blood pressures in the mild range.  Currently on labetalol  800 mg 3 times daily.  Patient has a history of severe headaches with Procardia  therefore labetalol  was the medication of choice for antihypertensive.  Labetalol  was titrated to maximum dose yesterday. - Continue inpatient management through delivery - Given severe preeclampsia will proceed with delivery at 34 weeks - Status post course of betamethasone  on 5/29 and 5/30.  Plan rescue course of betamethasone  at 33+5 and 33+6  2) Hypokalemia: Potassium now in normal range on potassium 40 mEq 4 times daily.  Hospitalist service assisted with management, appreciate their assistance. -Potassium 3.8 today -From hospitalist consult note 6/6: Spot urinary potassium is appropriate and appears to rule out renal potassium  wasting disorder May also be associated with poor absorption related to hypomagnesemia Cortisol level low-normal, cosyntropin  stimulation test with cortisol >20 at 1 hour (22) - still low-normal Mag++ has remained low and is now repleted with 400 mg QID K+ supplementation is now at 40 mEq QID Will need ongoing close monitoring with likely daily labs while hospitalized If this is related to pseudohyperaldosteronism from pregnancy, it may resolve after delivery Will refer to endocrinology as an outpatient Given normalization of K+ (finally!), TRH will sign off Should electrolytes become abnormal again or other new medical problems, please re-consult  3) Hypomagnesia: Patient on magnesium  oxide 400 mg 4 times daily -Magnesium  1.5 today  4) Anemia of pregnancy: Status post IV iron  x 2.  First infusion on 5/30, second infusion 6/6    Latest Ref Rng & Units 04/03/2024    4:36 AM 04/02/2024    5:29 AM 04/01/2024   10:13 AM  CBC  WBC 4.0 - 10.5 K/uL 8.4  7.7  8.2   Hemoglobin 12.0 -  15.0 g/dL 8.8  8.5  9.3   Hematocrit 36.0 - 46.0 % 28.7  28.0  30.3   Platelets 150 - 400 K/uL 166  181  200   - Ferritin 6 on admission, 61 on 6/6 - Discussed possibility of need for blood transfusion in emergency situation  5) History of persistent headache: Has been having persistent headaches since early in the pregnancy.  Saw neurology in consultation on the day of admission for persistent headache.  Now resolved.  Status post normal MRI of brain on hospital day #4. - Tylenol  and Fioricet  as needed  6) MOD: Desires VBAC.  History of 2 prior successful vaginal deliveries 1 of which was a VBAC.  Pelvis proven to 10 pounds.  Baby breech/transverse on last imaging study

## 2024-04-04 LAB — COMPREHENSIVE METABOLIC PANEL WITH GFR
ALT: 11 U/L (ref 0–44)
AST: 14 U/L — ABNORMAL LOW (ref 15–41)
Albumin: 2.4 g/dL — ABNORMAL LOW (ref 3.5–5.0)
Alkaline Phosphatase: 66 U/L (ref 38–126)
Anion gap: 5 (ref 5–15)
BUN: <5 mg/dL — ABNORMAL LOW (ref 6–20)
CO2: 23 mmol/L (ref 22–32)
Calcium: 10.6 mg/dL — ABNORMAL HIGH (ref 8.9–10.3)
Chloride: 109 mmol/L (ref 98–111)
Creatinine, Ser: 0.56 mg/dL (ref 0.44–1.00)
GFR, Estimated: >60 mL/min (ref >60)
Glucose, Bld: 83 mg/dL (ref 70–99)
Potassium: 3.9 mmol/L (ref 3.5–5.1)
Sodium: 137 mmol/L (ref 135–145)
Total Bilirubin: 0.6 mg/dL (ref 0.0–1.2)
Total Protein: 5.3 g/dL — ABNORMAL LOW (ref 6.5–8.1)

## 2024-04-04 LAB — CBC
HCT: 29.1 % — ABNORMAL LOW (ref 36.0–46.0)
Hemoglobin: 8.8 g/dL — ABNORMAL LOW (ref 12.0–15.0)
MCH: 24.4 pg — ABNORMAL LOW (ref 26.0–34.0)
MCHC: 30.2 g/dL (ref 30.0–36.0)
MCV: 80.6 fL (ref 80.0–100.0)
Platelets: 166 10*3/uL (ref 150–400)
RBC: 3.61 MIL/uL — ABNORMAL LOW (ref 3.87–5.11)
RDW: 21.3 % — ABNORMAL HIGH (ref 11.5–15.5)
WBC: 7.7 10*3/uL (ref 4.0–10.5)
nRBC: 0 % (ref 0.0–0.2)

## 2024-04-04 LAB — MAGNESIUM: Magnesium: 1.7 mg/dL (ref 1.7–2.4)

## 2024-04-04 NOTE — Progress Notes (Signed)
 Social visit:  I provided care for several days earlier this week.  She is now medically stable from a K+/Mag++ standpoint.  I stopped by to briefly visit with the patient, who reports doing well.  Labs are stabilized at this time.  Consider weaning down of potassium supplementation over the next few days if she remains medically stable (currently uptrending but only mildly so).   Kathyanne Parkers, M.D.

## 2024-04-04 NOTE — Progress Notes (Signed)
 Patient ID: Beth [name], female   DOB: 1986/06/25, 38 y.o.   MRN: [id number]  Hospital day #37  38 year old G4 P3-0-0-3 at 31+5 admitted with severe preeclampsia  S: Patient doing well, no complaints.  Active fetal movement, no vaginal bleeding or leaking.  Denies headaches.  No concerns  O: Vitals:   04/03/24 1955 04/04/24 0020 04/04/24 0435 04/04/24 0746  BP: (!) 129/59 (!) 98/57 (!) 121/47 120/73  Pulse: 71 64 68 75  Resp: 18 18 18 19   Temp: 98.3 F (36.8 C) 98.4 F (36.9 C) 98.3 F (36.8 C) 98.4 F (36.9 C)  TempSrc: Oral Oral Oral Oral  SpO2: 100% 100% 99% 99%  Weight:      Height:       Alert and orient x 3, no apparent distress Gravid, soft, nontender Fetal heart rate pending this AM but reactive overnight without fetal hear rate decelerations Cervix deferred Toco quiet  Results for orders placed or performed during the hospital encounter of 03/24/24 (from the past 24 hours)  Magnesium      Status: None   Collection Time: 04/04/24  5:19 AM  Result Value Ref Range   Magnesium  1.7 1.7 - 2.4 mg/dL  Comprehensive metabolic panel     Status: Abnormal   Collection Time: 04/04/24  5:19 AM  Result Value Ref Range   Sodium 137 135 - 145 mmol/L   Potassium 3.9 3.5 - 5.1 mmol/L   Chloride 109 98 - 111 mmol/L   CO2 23 22 - 32 mmol/L   Glucose, Bld 83 70 - 99 mg/dL   BUN <5 (L) 6 - 20 mg/dL   Creatinine, Ser 2.13 0.44 - 1.00 mg/dL   Calcium  10.6 (H) 8.9 - 10.3 mg/dL   Total Protein 5.3 (L) 6.5 - 8.1 g/dL   Albumin 2.4 (L) 3.5 - 5.0 g/dL   AST 14 (L) 15 - 41 U/L   ALT 11 0 - 44 U/L   Alkaline Phosphatase 66 38 - 126 U/L   Total Bilirubin 0.6 0.0 - 1.2 mg/dL   GFR, Estimated >08 >65 mL/min   Anion gap 5 5 - 15  CBC     Status: Abnormal   Collection Time: 04/04/24  5:19 AM  Result Value Ref Range   WBC 7.7 4.0 - 10.5 K/uL   RBC 3.61 (L) 3.87 - 5.11 MIL/uL   Hemoglobin 8.8 (L) 12.0 - 15.0 g/dL   HCT 78.4 (L) 69.6 - 29.5 %   MCV 80.6 80.0 - 100.0 fL   MCH 24.4 (L)  26.0 - 34.0 pg   MCHC 30.2 30.0 - 36.0 g/dL   RDW 28.4 (H) 13.2 - 44.0 %   Platelets 166 150 - 400 K/uL   nRBC 0.0 0.0 - 0.2 %   Assessment and plan: 38 year old G4 P3-0-0-3 at 31+5 with severe preeclampsia 1) Preeclampsia: Blood pressures in the mild range.  Currently on labetalol  800 mg 3 times daily.  Patient has a history of severe headaches with Procardia  therefore labetalol  was the medication of choice for antihypertensive.  Labetalol  was titrated to maximum dose on 04/02/24. - Continue inpatient management through delivery - Given severe preeclampsia will proceed with delivery at 34 weeks - Status post course of betamethasone  on 5/29 and 5/30.  Plan rescue course of betamethasone  at 33+5 and 33+6  2) Hypokalemia: Potassium now in normal range on potassium 40 mEq 4 times daily.  Hospitalist service assisted with management, appreciate their assistance. -Potassium 3.9 today, magnesium  finally at goal x 4  days--continue current replacement dose today.   -From hospitalist consult note 6/6: Spot urinary potassium is appropriate and appears to rule out renal potassium wasting disorder May also be associated with poor absorption related to hypomagnesemia Cortisol level low-normal, cosyntropin  stimulation test with cortisol >20 at 1 hour (22) - still low-normal Mag++ has remained low and is now repleted with 400 mg QID K+ supplementation is now at 40 mEq QID Will need ongoing close monitoring with likely daily labs while hospitalized If this is related to pseudohyperaldosteronism from pregnancy, it may resolve after delivery Will refer to endocrinology as an outpatient Given normalization of K+ (finally!), TRH will sign off Should electrolytes become abnormal again or other new medical problems, please re-consult  3) Hypomagnesia: Patient on magnesium  oxide 400 mg 4 times daily -Magnesium  1.7 today  4) Anemia of pregnancy: Status post IV iron  x 2.  First infusion on 5/30, second infusion  6/6    Latest Ref Rng & Units 04/04/2024    5:19 AM 04/03/2024    4:36 AM 04/02/2024    5:29 AM  CBC  WBC 4.0 - 10.5 K/uL 7.7  8.4  7.7   Hemoglobin 12.0 - 15.0 g/dL 8.8  8.8  8.5   Hematocrit 36.0 - 46.0 % 29.1  28.7  28.0   Platelets 150 - 400 K/uL 166  166  181   - Ferritin 6 on admission, 61 on 6/6 - Discussed possibility of need for blood transfusion in emergency situation  5) History of persistent headache: Has been having persistent headaches since early in the pregnancy.  Saw neurology in consultation on the day of admission for persistent headache.  Now resolved.  Status post normal MRI of brain on hospital day #4. - Tylenol  and Fioricet  as needed  6) MOD: Desires VBAC.  History of 2 prior successful vaginal deliveries 1 of which was a VBAC.  Pelvis proven to 10 pounds.  Baby breech/transverse on last imaging study

## 2024-04-04 NOTE — Plan of Care (Signed)
  Problem: Health Behavior/Discharge Planning: Goal: Ability to manage health-related needs will improve Outcome: Progressing   Problem: Clinical Measurements: Goal: Ability to maintain clinical measurements within normal limits will improve Outcome: Progressing Goal: Will remain free from infection Outcome: Progressing Goal: Diagnostic test results will improve Outcome: Progressing Goal: Respiratory complications will improve Outcome: Progressing Goal: Cardiovascular complication will be avoided Outcome: Progressing   Problem: Activity: Goal: Risk for activity intolerance will decrease Outcome: Progressing   Problem: Nutrition: Goal: Adequate nutrition will be maintained Outcome: Progressing   Problem: Coping: Goal: Level of anxiety will decrease Outcome: Progressing   Problem: Elimination: Goal: Will not experience complications related to bowel motility Outcome: Progressing Goal: Will not experience complications related to urinary retention Outcome: Progressing   Problem: Pain Managment: Goal: General experience of comfort will improve and/or be controlled Outcome: Progressing   Problem: Safety: Goal: Ability to remain free from injury will improve Outcome: Progressing   Problem: Skin Integrity: Goal: Risk for impaired skin integrity will decrease Outcome: Progressing   Problem: Education: Goal: Knowledge of disease or condition will improve Outcome: Progressing Goal: Knowledge of the prescribed therapeutic regimen will improve Outcome: Progressing Goal: Individualized Educational Video(s) Outcome: Progressing   Problem: Clinical Measurements: Goal: Complications related to the disease process, condition or treatment will be avoided or minimized Outcome: Progressing   Problem: Education: Goal: Knowledge of disease or condition will improve Outcome: Progressing Goal: Knowledge of the prescribed therapeutic regimen will improve Outcome: Progressing    Problem: Fluid Volume: Goal: Peripheral tissue perfusion will improve Outcome: Progressing   Problem: Clinical Measurements: Goal: Complications related to disease process, condition or treatment will be avoided or minimized Outcome: Progressing

## 2024-04-05 LAB — COMPREHENSIVE METABOLIC PANEL WITH GFR
ALT: 12 U/L (ref 0–44)
AST: 16 U/L (ref 15–41)
Albumin: 2.6 g/dL — ABNORMAL LOW (ref 3.5–5.0)
Alkaline Phosphatase: 56 U/L (ref 38–126)
Anion gap: 6 (ref 5–15)
BUN: 7 mg/dL (ref 6–20)
CO2: 21 mmol/L — ABNORMAL LOW (ref 22–32)
Calcium: 11 mg/dL — ABNORMAL HIGH (ref 8.9–10.3)
Chloride: 108 mmol/L (ref 98–111)
Creatinine, Ser: 0.6 mg/dL (ref 0.44–1.00)
GFR, Estimated: >60 mL/min (ref >60)
Glucose, Bld: 89 mg/dL (ref 70–99)
Potassium: 4 mmol/L (ref 3.5–5.1)
Sodium: 135 mmol/L (ref 135–145)
Total Bilirubin: 0.8 mg/dL (ref 0.0–1.2)
Total Protein: 6 g/dL — ABNORMAL LOW (ref 6.5–8.1)

## 2024-04-05 LAB — TYPE AND SCREEN
ABO/RH(D): O POS
Antibody Screen: NEGATIVE

## 2024-04-05 LAB — GLUCOSE, CAPILLARY: Glucose-Capillary: 128 mg/dL — ABNORMAL HIGH (ref 70–99)

## 2024-04-05 LAB — MAGNESIUM: Magnesium: 1.8 mg/dL (ref 1.7–2.4)

## 2024-04-05 MED ORDER — LACTATED RINGERS IV BOLUS
500.0000 mL | Freq: Once | INTRAVENOUS | Status: AC
  Administered 2024-04-05: 500 mL via INTRAVENOUS

## 2024-04-05 MED ORDER — LABETALOL HCL 200 MG PO TABS
600.0000 mg | ORAL_TABLET | Freq: Three times a day (TID) | ORAL | Status: DC
  Administered 2024-04-05 – 2024-04-07 (×7): 600 mg via ORAL
  Filled 2024-04-05 (×7): qty 3

## 2024-04-05 NOTE — Progress Notes (Signed)
 Today's Vitals   04/05/24 1352 04/05/24 1643 04/05/24 1919 04/05/24 2045  BP: 133/72 116/60 136/65   Pulse: 79 70 67   Resp: 16 17 18    Temp: 97.7 F (36.5 C) 98.1 F (36.7 C) 98.4 F (36.9 C)   TempSrc: Oral Oral Oral   SpO2: 99% 98% 100%   Weight:      Height:      PainSc:    0-No pain   Body mass index is 49.65 kg/m.  After symptomatic hypotensive episode this morning, labetalol  dose was decreased from 800mg  to 600mg  TID, which has kept BP in better range during the day today. Will continue at this dose and titrate PRN.  [name] MD 04/05/24 9:45 PM

## 2024-04-05 NOTE — Progress Notes (Signed)
 Patient ID: Beth [name], female   DOB: Jan 21, 1986, 38 y.o.   MRN: [id number]  [name] day #2  38 year old G4 P3-0-0-3 at 31+6 admitted with severe preeclampsia  S: Patient was feeling lightheaded/dizzy this AM. BP was 99/47, 96/50, CBG was 128. She had taken her labetalol  800mg  about 2 hours prior. She was given an IV fluid bolus and is feeling better now. Had a mild headache at the time but that has now resolved.   Active fetal movement, no vaginal bleeding or leaking.  O: Vitals:   04/05/24 0810 04/05/24 0823 04/05/24 0835 04/05/24 0900  BP: (!) 99/47 (!) 96/50 (!) 95/47 112/62  Pulse: 79 77 78 74  Resp: 16 20    Temp: 97.7 F (36.5 C) 98 F (36.7 C)    TempSrc: Oral Oral    SpO2: 98% 99%  98%  Weight:      Height:       Alert and orient x 3, no apparent distress Gravid, soft, nontender NST reactive 130bpm, mod variability, + accels, no decels Cervix deferred Toco quiet  Results for orders placed or performed during the [name] encounter of 03/24/24 (from the past 24 hours)  Magnesium      Status: None   Collection Time: 04/05/24  5:42 AM  Result Value Ref Range   Magnesium  1.8 1.7 - 2.4 mg/dL  Comprehensive metabolic panel     Status: Abnormal   Collection Time: 04/05/24  5:42 AM  Result Value Ref Range   Sodium 135 135 - 145 mmol/L   Potassium 4.0 3.5 - 5.1 mmol/L   Chloride 108 98 - 111 mmol/L   CO2 21 (L) 22 - 32 mmol/L   Glucose, Bld 89 70 - 99 mg/dL   BUN 7 6 - 20 mg/dL   Creatinine, Ser 0.86 0.44 - 1.00 mg/dL   Calcium  11.0 (H) 8.9 - 10.3 mg/dL   Total Protein 6.0 (L) 6.5 - 8.1 g/dL   Albumin 2.6 (L) 3.5 - 5.0 g/dL   AST 16 15 - 41 U/L   ALT 12 0 - 44 U/L   Alkaline Phosphatase 56 38 - 126 U/L   Total Bilirubin 0.8 0.0 - 1.2 mg/dL   GFR, Estimated >57 >84 mL/min   Anion gap 6 5 - 15  Type and screen Beth [name]     Status: None   Collection Time: 04/05/24  5:53 AM  Result Value Ref Range   ABO/RH(D) O POS    Antibody Screen NEG     Sample Expiration      04/08/2024,2359 Performed at [facility], [address], [place], [place] [address]   Glucose, capillary     Status: Abnormal   Collection Time: 04/05/24  8:23 AM  Result Value Ref Range   Glucose-Capillary 128 (H) 70 - 99 mg/dL   Assessment and plan: 38 year old G4 P3-0-0-3 at 31+6 with severe preeclampsia 1) Preeclampsia: Currently on labetalol  800 mg 3 times daily, though had an episode of symptomatic hypotension this AM. Will monitor BP this morning and consider decreasing labetalol  dose.  Patient has a history of severe headaches with Procardia  therefore labetalol  was the medication of choice for antihypertensive.  Labetalol  was titrated to maximum dose on 04/02/24. - Continue inpatient management through delivery - Given severe preeclampsia will proceed with delivery at 34 weeks - Status post course of betamethasone  on 5/29 and 5/30.  Plan rescue course of betamethasone  at 33+5 and 33+6  2) Hypokalemia: Potassium now in normal range  on potassium 40 mEq 4 times daily.  Hospitalist service assisted with management, appreciate their assistance. -Potassium 4.0 today, magnesium  finally at goal x 5 days--continue current replacement dose today.   -From hospitalist consult note 6/6: Spot urinary potassium is appropriate and appears to rule out renal potassium wasting disorder May also be associated with poor absorption related to hypomagnesemia Cortisol level low-normal, cosyntropin  stimulation test with cortisol >20 at 1 hour (22) - still low-normal Mag++ has remained low and is now repleted with 400 mg QID K+ supplementation is now at 40 mEq QID Will need ongoing close monitoring with likely daily labs while hospitalized If this is related to pseudohyperaldosteronism from pregnancy, it may resolve after delivery Will refer to endocrinology as an outpatient Given normalization of K+ (finally!), TRH will sign off Should electrolytes become abnormal again or  other new medical problems, please re-consult  3) Hypomagnesia: Patient on magnesium  oxide 400 mg 4 times daily -Magnesium  1.8 today  4) Anemia of pregnancy: Status post IV iron  x 2.  First infusion on 5/30, second infusion 6/6    Latest Ref Rng & Units 04/04/2024    5:19 AM 04/03/2024    4:36 AM 04/02/2024    5:29 AM  CBC  WBC 4.0 - 10.5 K/uL 7.7  8.4  7.7   Hemoglobin 12.0 - 15.0 g/dL 8.8  8.8  8.5   Hematocrit 36.0 - 46.0 % 29.1  28.7  28.0   Platelets 150 - 400 K/uL 166  166  181   - Ferritin 6 on admission, 61 on 6/6 - Discussed possibility of need for blood transfusion in emergency situation  5) History of persistent headache: Has been having persistent headaches since early in the pregnancy.  Saw neurology in consultation on the day of admission for persistent headache.  Now resolved.  Status post normal MRI of brain on [name] day #4. - Tylenol  and Fioricet  as needed  6) MOD: Desires VBAC.  History of 2 prior successful vaginal deliveries 1 of which was a VBAC.  Pelvis proven to 10 pounds.  Baby breech/transverse on last imaging study  Beth Germany MD 04/05/24 10:08 AM

## 2024-04-06 ENCOUNTER — Inpatient Hospital Stay (HOSPITAL_COMMUNITY)

## 2024-04-06 DIAGNOSIS — O99213 Obesity complicating pregnancy, third trimester: Secondary | ICD-10-CM | POA: Diagnosis not present

## 2024-04-06 DIAGNOSIS — O133 Gestational [pregnancy-induced] hypertension without significant proteinuria, third trimester: Secondary | ICD-10-CM | POA: Diagnosis not present

## 2024-04-06 DIAGNOSIS — O09523 Supervision of elderly multigravida, third trimester: Secondary | ICD-10-CM | POA: Diagnosis not present

## 2024-04-06 DIAGNOSIS — O09293 Supervision of pregnancy with other poor reproductive or obstetric history, third trimester: Secondary | ICD-10-CM

## 2024-04-06 DIAGNOSIS — Z3A32 32 weeks gestation of pregnancy: Secondary | ICD-10-CM

## 2024-04-06 LAB — COMPREHENSIVE METABOLIC PANEL WITH GFR
ALT: 12 U/L (ref 0–44)
AST: 16 U/L (ref 15–41)
Albumin: 2.6 g/dL — ABNORMAL LOW (ref 3.5–5.0)
Alkaline Phosphatase: 76 U/L (ref 38–126)
Anion gap: 9 (ref 5–15)
BUN: 6 mg/dL (ref 6–20)
CO2: 20 mmol/L — ABNORMAL LOW (ref 22–32)
Calcium: 11.6 mg/dL — ABNORMAL HIGH (ref 8.9–10.3)
Chloride: 106 mmol/L (ref 98–111)
Creatinine, Ser: 0.63 mg/dL (ref 0.44–1.00)
GFR, Estimated: >60 mL/min (ref >60)
Glucose, Bld: 94 mg/dL (ref 70–99)
Potassium: 4.3 mmol/L (ref 3.5–5.1)
Sodium: 135 mmol/L (ref 135–145)
Total Bilirubin: 0.8 mg/dL (ref 0.0–1.2)
Total Protein: 5.9 g/dL — ABNORMAL LOW (ref 6.5–8.1)

## 2024-04-06 MED ORDER — POTASSIUM CHLORIDE CRYS ER 20 MEQ PO TBCR
40.0000 meq | EXTENDED_RELEASE_TABLET | Freq: Three times a day (TID) | ORAL | Status: DC
  Administered 2024-04-06 – 2024-04-27 (×56): 40 meq via ORAL
  Filled 2024-04-06 (×63): qty 2

## 2024-04-06 NOTE — Progress Notes (Addendum)
 Subjective: HD #13 Patient reports tolerating PO but increased reflux and nausea - relieved with tums and pepcid . She admits to fatigue, muscles feel weak . She denies HA, visual changes or ctxs. +Fms   Objective: I have reviewed patient's vital signs, intake and output, medications, and labs.  General: alert, cooperative, no distress, and easily arousable from sleep on entry to room Extremities: Homans sign is negative, no sign of DVT ABD - cw GA.  SVE - deferred  EFM   Vitals:   04/06/24 0615 04/06/24 0741  BP:  130/73  Pulse: 70 77  Resp:  17  Temp:  98.2 F (36.8 C)  SpO2:  99%    CBC    Component Value Date/Time   WBC 7.7 04/04/2024 0519   RBC 3.61 (L) 04/04/2024 0519   HGB 8.8 (L) 04/04/2024 0519   HCT 29.1 (L) 04/04/2024 0519   PLT 166 04/04/2024 0519   MCV 80.6 04/04/2024 0519   MCH 24.4 (L) 04/04/2024 0519   MCHC 30.2 04/04/2024 0519   RDW 21.3 (H) 04/04/2024 0519   LYMPHSABS 1.7 03/30/2024 0504   MONOABS 0.7 03/30/2024 0504   EOSABS 0.3 03/30/2024 0504   BASOSABS 0.0 03/30/2024 0504   .cmp   Assessment/Plan:  38 year old G4 P3-0-0-3 at 32wks with severe preeclampsia  1) Preeclampsia: Patient has a history of severe headaches with procardia  therefore labetalol  was the medication of choice for antihypertensive. She was increased to max dose labetalol  on 04/02/24 however yesterday, she was symptomatically hypotensive thus her dose was decreased from 800mg  to 600mg  TID. BP well controlled today.    - Continue inpatient management through delivery    - BPP weekly - ordered for today           Last US  was on 5/30//25.   - Given severe preeclampsia, plan delivery at 34 weeks    2.). Anticipated preterm delivery        - S/P course of betamethasone  on 5/29 and 5/30.         - Plan rescue course of betamethasone  at 33+5 and 33+6   3) Hypokalemia: Potassium now in normal range on potassium 40 mEq- decreased from  4 times to 3 times daily.   Hospitalist  service assisted with management, appreciate their assistance. -Potassium 4.3 today, magnesium  finally at goal as of 04/05/24. ( 5 days) -continue current replacement dose today.   - Recheck prn K level changes   -From hospitalist consult note 6/6: Spot urinary potassium is appropriate and appears to rule out renal potassium wasting disorder May also be associated with poor absorption related to hypomagnesemia Cortisol level low-normal, cosyntropin  stimulation test with cortisol >20 at 1 hour (22) - still low-normal Mag++ has remained low and is now repleted with 400 mg QID K+ supplementation is now at 40 mEq QID Will need ongoing close monitoring with likely daily labs while hospitalized If this is related to pseudohyperaldosteronism from pregnancy, it may resolve after delivery Will refer to endocrinology as an outpatient Given normalization of K+ (finally!), TRH will sign off Should electrolytes become abnormal again or other new medical problems, please re-consult   4) Hypomagnesia: Patient on magnesium  oxide 400 mg 4 times daily -Magnesium  1.8 at last check 04/05/24   50 Anemia of pregnancy: Status post IV iron  x 2.  First infusion on 5/30, second infusion 6/6  - Ferritin 6 on admission, 61 on 6/6 - Discussed possibility of need for blood transfusion iif symptomatic and also if an emergency  situation presents   6) History of persistent headache: None today.  Has been having persistent headaches since early in the pregnancy.  Saw neurology in consultation on the day of admission for persistent headache.    Status post normal MRI of brain on hospital day #4. - Tylenol  and Fioricet  as needed   7) MOD: Desires VBAC.  History of 2 prior successful vaginal deliveries 1 of which was a VBAC.  Pelvis proven to 10 pounds.  Baby breech/transverse on last imaging study - Understands will need c/s if baby not vertex at 34 weeks       LOS: 13 days    Levis Reams, DO 04/06/2024, 10:28  AM

## 2024-04-07 LAB — COMPREHENSIVE METABOLIC PANEL WITH GFR
ALT: 11 U/L (ref 0–44)
AST: 15 U/L (ref 15–41)
Albumin: 2.8 g/dL — ABNORMAL LOW (ref 3.5–5.0)
Alkaline Phosphatase: 68 U/L (ref 38–126)
Anion gap: 8 (ref 5–15)
BUN: 8 mg/dL (ref 6–20)
CO2: 22 mmol/L (ref 22–32)
Calcium: 13 mg/dL — ABNORMAL HIGH (ref 8.9–10.3)
Chloride: 108 mmol/L (ref 98–111)
Creatinine, Ser: 0.72 mg/dL (ref 0.44–1.00)
GFR, Estimated: >60 mL/min (ref >60)
Glucose, Bld: 101 mg/dL — ABNORMAL HIGH (ref 70–99)
Potassium: 4.3 mmol/L (ref 3.5–5.1)
Sodium: 138 mmol/L (ref 135–145)
Total Bilirubin: 0.9 mg/dL (ref 0.0–1.2)
Total Protein: 6.4 g/dL — ABNORMAL LOW (ref 6.5–8.1)

## 2024-04-07 LAB — CBC
HCT: 35.4 % — ABNORMAL LOW (ref 36.0–46.0)
Hemoglobin: 11 g/dL — ABNORMAL LOW (ref 12.0–15.0)
MCH: 24.8 pg — ABNORMAL LOW (ref 26.0–34.0)
MCHC: 31.1 g/dL (ref 30.0–36.0)
MCV: 79.9 fL — ABNORMAL LOW (ref 80.0–100.0)
Platelets: 192 10*3/uL (ref 150–400)
RBC: 4.43 MIL/uL (ref 3.87–5.11)
RDW: 22.5 % — ABNORMAL HIGH (ref 11.5–15.5)
WBC: 7 10*3/uL (ref 4.0–10.5)
nRBC: 0 % (ref 0.0–0.2)

## 2024-04-07 LAB — MAGNESIUM: Magnesium: 1.8 mg/dL (ref 1.7–2.4)

## 2024-04-07 MED ORDER — LABETALOL HCL 200 MG PO TABS
400.0000 mg | ORAL_TABLET | Freq: Three times a day (TID) | ORAL | Status: DC
  Administered 2024-04-07 – 2024-04-09 (×5): 400 mg via ORAL
  Filled 2024-04-07 (×5): qty 2

## 2024-04-07 NOTE — Progress Notes (Signed)
 Patient ID: Beth [name], female   DOB: 28-May-1986, 38 y.o.   MRN: [id number]  Hospital day #43  38 year old G4P3 @ [redacted]w[redacted]d admitted with severe preeclampsia  S: Feeling well this AM.  Denies HA, visual changes, RUQ or epigastric pain.  Has been walking the halls at least once daily.  Active fetal movement, no vaginal bleeding or leaking.   BP was low this AM, but patient was lying on side when taken.  She was asymptomatic.  Repeat normal.   O: Vitals:   04/07/24 0342 04/07/24 0751 04/07/24 0758 04/07/24 0905  BP: (!) 109/58 (!) 86/44 (!) 94/52 121/71  Pulse: 72 77 74 84  Resp: 17 17    Temp: 98.1 F (36.7 C) 98 F (36.7 C)    TempSrc: Oral Oral    SpO2: 98% 99% 99%   Weight:      Height:       Alert and orient x 3, no apparent distress Gravid, soft, nontender NST 130s, moderate variability, no decelerations, category 1 Cervix deferred Toco quiet  Results for orders placed or performed during the hospital encounter of 03/24/24 (from the past 24 hours)  Magnesium      Status: None   Collection Time: 04/07/24  6:12 AM  Result Value Ref Range   Magnesium  1.8 1.7 - 2.4 mg/dL  CBC     Status: Abnormal   Collection Time: 04/07/24  7:49 AM  Result Value Ref Range   WBC 7.0 4.0 - 10.5 K/uL   RBC 4.43 3.87 - 5.11 MIL/uL   Hemoglobin 11.0 (L) 12.0 - 15.0 g/dL   HCT 29.5 (L) 28.4 - 13.2 %   MCV 79.9 (L) 80.0 - 100.0 fL   MCH 24.8 (L) 26.0 - 34.0 pg   MCHC 31.1 30.0 - 36.0 g/dL   RDW 44.0 (H) 10.2 - 72.5 %   Platelets 192 150 - 400 K/uL   nRBC 0.0 0.0 - 0.2 %  Comprehensive metabolic panel     Status: Abnormal   Collection Time: 04/07/24  7:49 AM  Result Value Ref Range   Sodium 138 135 - 145 mmol/L   Potassium 4.3 3.5 - 5.1 mmol/L   Chloride 108 98 - 111 mmol/L   CO2 22 22 - 32 mmol/L   Glucose, Bld 101 (H) 70 - 99 mg/dL   BUN 8 6 - 20 mg/dL   Creatinine, Ser 3.66 0.44 - 1.00 mg/dL   Calcium  13.0 (H) 8.9 - 10.3 mg/dL   Total Protein 6.4 (L) 6.5 - 8.1 g/dL   Albumin 2.8 (L)  3.5 - 5.0 g/dL   AST 15 15 - 41 U/L   ALT 11 0 - 44 U/L   Alkaline Phosphatase 68 38 - 126 U/L   Total Bilirubin 0.9 0.0 - 1.2 mg/dL   GFR, Estimated >44 >03 mL/min   Anion gap 8 5 - 15   Assessment and plan: 38 year old G4P3 @ [redacted]w[redacted]d with severe preeclampsia  1) Preeclampsia: Was on abetalol 800 mgTID, but with low BPs two days ago and symptomatic so it was decreased to 600 mg TID.  Again low this AM, though had no symptoms.  Initial low BPs were taken when patient was lying on her side.  Repeat was normal.  Will continue 600 mg TID and monitor closely today Patient has a history of severe headaches with Procardia  therefore labetalol  was the medication of choice for antihypertensive.  Labetalol  was titrated to maximum dose on 04/02/24, then decreased on 04/05/24. - Continue inpatient  management through delivery - Given severe preeclampsia will proceed with delivery at 34 weeks - Status post course of betamethasone  on 5/29 and 5/30.  Plan rescue course of betamethasone  at 33+5 and 33+6  2) Hypokalemia: Potassium now in normal range on potassium 40 mEq TID, decreased from QID and stable at 4.3 over last two days.  Continue current regimen today     -From hospitalist consult note 6/6: Spot urinary potassium is appropriate and appears to rule out renal potassium wasting disorder May also be associated with poor absorption related to hypomagnesemia Cortisol level low-normal, cosyntropin  stimulation test with cortisol >20 at 1 hour (22) - still low-normal Mag++ has remained low and is now repleted with 400 mg QID K+ supplementation is now at 40 mEq QID Will need ongoing close monitoring with likely daily labs while hospitalized If this is related to pseudohyperaldosteronism from pregnancy, it may resolve after delivery Will refer to endocrinology as an outpatient Given normalization of K+ (finally!), TRH will sign off Should electrolytes become abnormal again or other new medical problems, please  re-consult  3) Hypomagnesia: Patient on magnesium  oxide 400 mg 4 times daily -Magnesium  1.8 today  4) Anemia of pregnancy: Status post IV iron  x 2.  First infusion on 5/30, second infusion 6/6    Latest Ref Rng & Units 04/07/2024    7:49 AM 04/04/2024    5:19 AM 04/03/2024    4:36 AM  CBC  WBC 4.0 - 10.5 K/uL 7.0  7.7  8.4   Hemoglobin 12.0 - 15.0 g/dL 81.1  8.8  8.8   Hematocrit 36.0 - 46.0 % 35.4  29.1  28.7   Platelets 150 - 400 K/uL 192  166  166   - Ferritin 6 on admission, 61 on 6/6 - Discussed possibility of need for blood transfusion in emergency situation - Improved to 11 on 04/07/24  5) History of persistent headache: Has been having persistent headaches since early in the pregnancy.  Saw neurology in consultation on the day of admission for persistent headache.  Now resolved.  Status post normal MRI of brain on hospital day #4. - Tylenol  and Fioricet  as needed  6) MOD: Desires VBAC.  History of 2 prior successful vaginal deliveries 1 of which was a VBAC.  Pelvis proven to 10 pounds.  Baby breech/transverse on last imaging study    1220: f/u BP remains at goal.  Will not further decrease labetalol  at this time, but monitor closely BP 122/80 (BP Location: Left Arm)   Pulse 87   Temp 98.4 F (36.9 C) (Oral)   Resp 16   Ht 5' 11 (1.803 m)   Wt (!) 161.5 kg   SpO2 100%   BMI 49.65 kg/m

## 2024-04-07 NOTE — Progress Notes (Signed)
 BPs still running low normal.  Will further decrease labetalol  to 400 TID  Vitals:   04/07/24 0905 04/07/24 1144 04/07/24 1555 04/07/24 1913  BP: 121/71 122/80 108/67 98/84  Pulse: 84 87 81 77  Resp:  16 16 16   Temp:  98.4 F (36.9 C) 97.8 F (36.6 C) 97.9 F (36.6 C)  TempSrc:  Oral Oral Oral  SpO2:  100% 100% 100%  Weight:      Height:         Beth [name] Beth [name]

## 2024-04-07 NOTE — Plan of Care (Deleted)
 Care plan

## 2024-04-07 NOTE — Plan of Care (Signed)
  Problem: Health Behavior/Discharge Planning: Goal: Ability to manage health-related needs will improve Outcome: Progressing   Problem: Clinical Measurements: Goal: Ability to maintain clinical measurements within normal limits will improve Outcome: Progressing Goal: Will remain free from infection Outcome: Progressing Goal: Diagnostic test results will improve Outcome: Progressing   Problem: Activity: Goal: Risk for activity intolerance will decrease Outcome: Progressing   Problem: Nutrition: Goal: Adequate nutrition will be maintained Outcome: Progressing   Problem: Coping: Goal: Level of anxiety will decrease Outcome: Progressing

## 2024-04-08 LAB — COMPREHENSIVE METABOLIC PANEL WITH GFR
ALT: 11 U/L (ref 0–44)
AST: 16 U/L (ref 15–41)
Albumin: 2.8 g/dL — ABNORMAL LOW (ref 3.5–5.0)
Alkaline Phosphatase: 66 U/L (ref 38–126)
Anion gap: 8 (ref 5–15)
BUN: 6 mg/dL (ref 6–20)
CO2: 21 mmol/L — ABNORMAL LOW (ref 22–32)
Calcium: 13.2 mg/dL (ref 8.9–10.3)
Chloride: 108 mmol/L (ref 98–111)
Creatinine, Ser: 0.64 mg/dL (ref 0.44–1.00)
GFR, Estimated: >60 mL/min (ref >60)
Glucose, Bld: 97 mg/dL (ref 70–99)
Potassium: 4.3 mmol/L (ref 3.5–5.1)
Sodium: 137 mmol/L (ref 135–145)
Total Bilirubin: 0.9 mg/dL (ref 0.0–1.2)
Total Protein: 6.4 g/dL — ABNORMAL LOW (ref 6.5–8.1)

## 2024-04-08 LAB — TYPE AND SCREEN
ABO/RH(D): O POS
Antibody Screen: NEGATIVE

## 2024-04-08 LAB — MAGNESIUM: Magnesium: 1.7 mg/dL (ref 1.7–2.4)

## 2024-04-08 NOTE — Progress Notes (Signed)
 Patient ID: Beth [name], female   DOB: 09/11/1986, 38 y.o.   MRN: [id number]   38 yo G4P4 @ 32.2 HD#15 for preE w/ SF   S: Normal FM. No CTX, LOF, VB, HA, CP/SOB  BP normotensive after waking    O: Vitals:   04/08/24 0415 04/08/24 0550 04/08/24 0556 04/08/24 0729  BP: 105/64 128/79 128/79 (!) 120/57  Pulse: 71 87 87 74  Resp: 16   17  Temp: 98 F (36.7 C)   98.2 F (36.8 C)  TempSrc: Oral   Oral  SpO2: 98% 98%  98%  Weight:      Height:         Gen: alert, NAD, morbidly obese Abd: Gravid, soft, non-tender Ext: no edema, non-tender  NST: 125, moderate variability, accels present, no decelerations, category 1 Toco: quiet   Results for orders placed or performed during the hospital encounter of 03/24/24 (from the past 24 hours)  Type and screen [facility]     Status: None   Collection Time: 04/08/24  5:45 AM  Result Value Ref Range   ABO/RH(D) O POS    Antibody Screen NEG    Sample Expiration      04/11/2024,2359 Performed at [facility], [address], [place], [place] [address]   Magnesium      Status: None   Collection Time: 04/08/24  5:45 AM  Result Value Ref Range   Magnesium  1.7 1.7 - 2.4 mg/dL  Comprehensive metabolic panel     Status: Abnormal   Collection Time: 04/08/24  5:45 AM  Result Value Ref Range   Sodium 137 135 - 145 mmol/L   Potassium 4.3 3.5 - 5.1 mmol/L   Chloride 108 98 - 111 mmol/L   CO2 21 (L) 22 - 32 mmol/L   Glucose, Bld 97 70 - 99 mg/dL   BUN 6 6 - 20 mg/dL   Creatinine, Ser 7.82 0.44 - 1.00 mg/dL   Calcium  13.2 (HH) 8.9 - 10.3 mg/dL   Total Protein 6.4 (L) 6.5 - 8.1 g/dL   Albumin 2.8 (L) 3.5 - 5.0 g/dL   AST 16 15 - 41 U/L   ALT 11 0 - 44 U/L   Alkaline Phosphatase 66 38 - 126 U/L   Total Bilirubin 0.9 0.0 - 1.2 mg/dL   GFR, Estimated >95 >62 mL/min   Anion gap 8 5 - 15      A/P: 38 yo G4P3 @ 32.2 admitted for PreE w/ SF   1) Preeclampsia: Current regimen: Labetalol  400 BID. Continue current dose as  BP normotensive today. Do not take BP while side lying or patient sleeping as this can falsely lower BP.  Patient has a history of severe headaches with Procardia  therefore labetalol  was the medication of choice for antihypertensive.  Labetalol  was titrated to maximum dose on 04/02/24 and has been subsequently decrease requirements - Continue inpatient management through delivery - Given severe preeclampsia will proceed with delivery at 34 weeks - Status post course of betamethasone  on 5/29 and 5/30.  Plan rescue course of betamethasone  at 33+5 and 33+6   2) Hypokalemia: Potassium now in normal range on potassium 40 mEq TID.  -From hospitalist consult note 6/6: Spot urinary potassium is appropriate and appears to rule out renal potassium wasting disorder May also be associated with poor absorption related to hypomagnesemia Cortisol level low-normal, cosyntropin  stimulation test with cortisol >20 at 1 hour (22) - still low-normal Mag++ has remained low and is now  repleted with 400 mg QID K+ supplementation is now at 40 mEq QID Will need ongoing close monitoring with likely daily labs while hospitalized If this is related to pseudohyperaldosteronism from pregnancy, it may resolve after delivery Will refer to endocrinology as an outpatient Given normalization of K+ (finally!), TRH will sign off Should electrolytes become abnormal again or other new medical problems, please re-consult   3) Hypomagnesia, resolved: Patient on magnesium  oxide 400 mg 4 times daily   4) Anemia of pregnancy: Status post IV iron  x 2.  First infusion on 5/30, second infusion 6/6 - Improved to 11.0 on 04/07/24   5) History of persistent headache: Has been having persistent headaches since early in the pregnancy.  Saw neurology in consultation on the day of admission for persistent headache.  Now resolved.  Status post normal MRI of brain on hospital day #4. - Tylenol  and Fioricet  as needed  6) Hypercalcemia: Trending up  since 6/7. 13.2 today. Ionized calcium  and PTH ordered.   7) MOD: Desires VBAC.  History of 2 prior successful vaginal deliveries 1 of which was a VBAC.  Pelvis proven to 10 pounds.  Baby breech/transverse on last imaging study

## 2024-04-08 NOTE — Progress Notes (Signed)
 Called by RN re: AM labetalol  as most recent BPs were low.  Asked nurse to repeat making sure patient was sitting up and awake.  Repeat normal range 128/79  Vitals:   04/07/24 2346 04/08/24 0415 04/08/24 0550 04/08/24 0556  BP: (!) 97/57 105/64 128/79 128/79  Pulse: 74 71 87 87  Resp: 17 16    Temp: 97.7 F (36.5 C) 98 F (36.7 C)    TempSrc: Oral Oral    SpO2: 98% 98% 98%   Weight:      Height:       Continue current dose of labetalol  400 mg TID Asked nursing to ensure patient is sitting when taking BP in the future as some BPs are clearly documented as being obtained in the left lateral position  [facility] GEFFEL [place]

## 2024-04-09 DIAGNOSIS — E876 Hypokalemia: Secondary | ICD-10-CM | POA: Diagnosis not present

## 2024-04-09 LAB — COMPREHENSIVE METABOLIC PANEL WITH GFR
ALT: 12 U/L (ref 0–44)
AST: 17 U/L (ref 15–41)
Albumin: 2.7 g/dL — ABNORMAL LOW (ref 3.5–5.0)
Alkaline Phosphatase: 76 U/L (ref 38–126)
Anion gap: 7 (ref 5–15)
BUN: 8 mg/dL (ref 6–20)
CO2: 22 mmol/L (ref 22–32)
Calcium: 13.6 mg/dL (ref 8.9–10.3)
Chloride: 108 mmol/L (ref 98–111)
Creatinine, Ser: 0.65 mg/dL (ref 0.44–1.00)
GFR, Estimated: >60 mL/min (ref >60)
Glucose, Bld: 96 mg/dL (ref 70–99)
Potassium: 4.1 mmol/L (ref 3.5–5.1)
Sodium: 137 mmol/L (ref 135–145)
Total Bilirubin: 0.8 mg/dL (ref 0.0–1.2)
Total Protein: 6.2 g/dL — ABNORMAL LOW (ref 6.5–8.1)

## 2024-04-09 LAB — CALCIUM, IONIZED: Calcium, Ionized, Serum: 7.3 mg/dL — ABNORMAL HIGH (ref 4.5–5.6)

## 2024-04-09 LAB — BASIC METABOLIC PANEL WITH GFR
Anion gap: 9 (ref 5–15)
BUN: 10 mg/dL (ref 6–20)
CO2: 18 mmol/L — ABNORMAL LOW (ref 22–32)
Calcium: 12.7 mg/dL — ABNORMAL HIGH (ref 8.9–10.3)
Chloride: 108 mmol/L (ref 98–111)
Creatinine, Ser: 0.6 mg/dL (ref 0.44–1.00)
GFR, Estimated: >60 mL/min (ref >60)
Glucose, Bld: 113 mg/dL — ABNORMAL HIGH (ref 70–99)
Potassium: 4.1 mmol/L (ref 3.5–5.1)
Sodium: 135 mmol/L (ref 135–145)

## 2024-04-09 LAB — MAGNESIUM: Magnesium: 1.9 mg/dL (ref 1.7–2.4)

## 2024-04-09 LAB — TSH: TSH: 0.637 u[IU]/mL (ref 0.350–4.500)

## 2024-04-09 LAB — VITAMIN D 25 HYDROXY (VIT D DEFICIENCY, FRACTURES): Vit D, 25-Hydroxy: 61.75 ng/mL (ref 30–100)

## 2024-04-09 MED ORDER — LACTATED RINGERS IV SOLN: INTRAVENOUS | Status: DC

## 2024-04-09 MED ORDER — LABETALOL HCL 200 MG PO TABS
200.0000 mg | ORAL_TABLET | Freq: Once | ORAL | Status: AC
  Administered 2024-04-09: 200 mg via ORAL
  Filled 2024-04-09: qty 1

## 2024-04-09 MED ORDER — CALCITONIN (SALMON) 200 UNIT/ML IJ SOLN
400.0000 [IU] | Freq: Two times a day (BID) | INTRAMUSCULAR | Status: AC
  Administered 2024-04-09 – 2024-04-10 (×4): 400 [IU] via INTRAMUSCULAR
  Filled 2024-04-09 (×5): qty 2

## 2024-04-09 MED ORDER — LABETALOL HCL 200 MG PO TABS
600.0000 mg | ORAL_TABLET | Freq: Three times a day (TID) | ORAL | Status: DC
  Administered 2024-04-09 – 2024-04-20 (×27): 600 mg via ORAL
  Filled 2024-04-09 (×34): qty 3

## 2024-04-09 MED ORDER — SODIUM CHLORIDE 0.45 % IV SOLN: INTRAVENOUS | Status: DC

## 2024-04-09 MED ORDER — SODIUM CHLORIDE 0.9 % IV SOLN: INTRAVENOUS | Status: DC

## 2024-04-09 NOTE — Progress Notes (Signed)
 S/p Hospitalist consult. Nausea reaction to calcitonin, feeling improved.  BP 139/75 (BP Location: Left Arm)   Pulse 77   Temp 97.7 F (36.5 C) (Oral)   Resp 20   Ht 5' 11 (1.803 m)   Wt (!) 161.5 kg   SpO2 100%   BMI 49.65 kg/m  Has PRN zofran  available. Appreciate TRH following case

## 2024-04-09 NOTE — Progress Notes (Addendum)
 Patient ID: Beth [name], female   DOB: [birth year], 38 y.o.   MRN: [id number]   38 yo G4P4 @ 32.2 HD#16 for preE w/ SF   S: Normal FM. No CTX, LOF, VB, HA, CP/SOB  Has noticed increased feeling of just tiredness and weakness while ambulating but, as above, denies true CV symptoms. Reviewed new hypercalcemia. Has h/o prior hypothyroidism, mother has graves. Ionized calcium  elevated, PTH pending.    O: Vitals:   04/08/24 1922 04/09/24 0015 04/09/24 0452 04/09/24 0807  BP: (!) 148/86 125/64 (!) 151/82 133/68  Pulse: 81 72 80 75  Resp: 18 18 16 16   Temp: (P) 98 F (36.7 C) 97.9 F (36.6 C) 98 F (36.7 C) 98.3 F (36.8 C)  TempSrc:  Oral Oral Oral  SpO2: 100% 97% 98% 100%  Weight:      Height:         Gen: alert, NAD, morbidly obese Abd: Gravid, soft, non-tender Ext: no edema, non-tender  NST: 120, moderate variability, accels present, no decelerations, category 1 Toco: quiet   Results for orders placed or performed during the hospital encounter of 03/24/24 (from the past 24 hours)  Magnesium      Status: None   Collection Time: 04/09/24  6:29 AM  Result Value Ref Range   Magnesium  1.9 1.7 - 2.4 mg/dL  Comprehensive metabolic panel     Status: Abnormal   Collection Time: 04/09/24  6:29 AM  Result Value Ref Range   Sodium 137 135 - 145 mmol/L   Potassium 4.1 3.5 - 5.1 mmol/L   Chloride 108 98 - 111 mmol/L   CO2 22 22 - 32 mmol/L   Glucose, Bld 96 70 - 99 mg/dL   BUN 8 6 - 20 mg/dL   Creatinine, Ser 4.09 0.44 - 1.00 mg/dL   Calcium  13.6 (HH) 8.9 - 10.3 mg/dL   Total Protein 6.2 (L) 6.5 - 8.1 g/dL   Albumin 2.7 (L) 3.5 - 5.0 g/dL   AST 17 15 - 41 U/L   ALT 12 0 - 44 U/L   Alkaline Phosphatase 76 38 - 126 U/L   Total Bilirubin 0.8 0.0 - 1.2 mg/dL   GFR, Estimated >81 >19 mL/min   Anion gap 7 5 - 15      A/P: 38 yo G4P3 @ 32.3 admitted for PreE w/ SF   1) Preeclampsia: Current regimen: Labetalol  400 BID. Continue current dose as BP normotensive today. Do not take BP  while side lying or patient sleeping as this can falsely lower BP.  Patient has a history of severe headaches with Procardia  therefore labetalol  was the medication of choice for antihypertensive.  Labetalol  was titrated to maximum dose on 04/02/24 and has been subsequently decrease requirements - Currently on 600mg  TID - Continue inpatient management through delivery - Given severe preeclampsia will proceed with delivery at 34 weeks - Status post course of betamethasone  on 5/29 and 5/30.  Plan rescue course of betamethasone  at 33+5 and 33+6   2) Hypokalemia: Potassium now in normal range on potassium 40 mEq TID.  -From hospitalist consult note 6/6: Spot urinary potassium is appropriate and appears to rule out renal potassium wasting disorder May also be associated with poor absorption related to hypomagnesemia Cortisol level low-normal, cosyntropin  stimulation test with cortisol >20 at 1 hour (22) - still low-normal Mag++ has remained low and is now repleted with 400 mg QID K+ supplementation is now at 40 mEq QID Will need ongoing close monitoring with likely daily labs  while hospitalized If this is related to pseudohyperaldosteronism from pregnancy, it may resolve after delivery Will refer to endocrinology as an outpatient Given normalization of K+ (finally!), TRH will sign off Should electrolytes become abnormal again or other new medical problems, please re-consult   3) Hypomagnesia, resolved: Patient on magnesium  oxide 400 mg 4 times daily   4) Anemia of pregnancy: Status post IV iron  x 2.  First infusion on 5/30, second infusion 6/6 - Improved to 11.0 on 04/07/24   5) History of persistent headache: Has been having persistent headaches since early in the pregnancy.  Saw neurology in consultation on the day of admission for persistent headache.  Now resolved.  Status post normal MRI of brain on hospital day #4. - Tylenol  and Fioricet  as needed  6) Hypercalcemia: Trending up since 6/7.  13.6 this AM, 13.2 yesterday. Ionized calcium  elevated at 7.6, PTH pending. MFM agrees with PTH labs, will re-consult hospitalist at this time   7) MOD: Desires VBAC.  History of 2 prior successful vaginal deliveries 1 of which was a VBAC.  Pelvis proven to 10 pounds.  Baby breech/transverse on last imaging study  ADDENDUM: 1125 - spoke with Hospitalist, will see. Should calcitonin be required in pregnancy, safe to do so. Appreciate recommendations

## 2024-04-09 NOTE — Consult Note (Signed)
 Initial Consultation Note   Patient: Beth [name] ZOX:[id number] DOB: [birth year] PCP: Patient, No Pcp Per DOA: 03/24/2024 DOS: the patient was seen and examined on 04/09/2024 Primary service: Theodoro Fisherman, MD  Referring physician: Dr Ethridge Herder Reason for consult: Hypercalcemia.    Assessment/Plan: 1-Hypercalcemia:  -Patient has develops Hypercalcemia over last several days/.  -Calcium  Correction by albumin 14.3 -PTH has been order. I have Added PTH like peptide, Vitamin D, TSH.  -She report some worsening fatigue and sleepiness over last couples of days.  Plan to proceed with treatment for moderate to severe Hypercalcemia with IV fluids NS at 125 cc/hor, and calcitonin BID> Monitor B-met twice a day.  Maternal fetal specialist following as well.  Medications reviewed with pharmacist, patient has  not been getting any meds that could cause hypercalcemia.   2-Hypokalemia;  Resolved. Continue with supplements.    3-Preeclampsia Per [facility]  TRH will continue to follow the patient.  HPI: Beth [name] is a 38 y.o. female  G4P3 at 72 weeks past medical history significant for anxiety,  previous history of hypothyroidism, family history of renal potassium wasting, patient was admitted 5/29 with preeclampsia by OB.  We  were consulted 6/01 for hypokalemia.  She was treated with magnesium  and potassium supplements.  Her potassium has now remained in a good range.  Blood pressure has been better controlled.  Plan is to proceed with delivery at 34 weeks.  Patient has developed hypercalcemia.  We were consulted to assist with management.  Calcium  level has been ranging around 10.5----has been slowly increasing since 6/09 ---to 11----subsequently 13.6--- calcium  correction by albumin 14.2     Review of Systems: As mentioned in the history of present illness. All other systems reviewed and are negative. Past Medical History:  Diagnosis Date   Anemia    Anxiety    Bulging lumbar disc     Headache    otc med prn - last one 4 months ago   History of blood transfusion    after 04/02/2016 svd at [facility]   Seizures (HCC)    Childhood   SVD (spontaneous vaginal delivery)    x 1   Past Surgical History:  Procedure Laterality Date   CESAREAN SECTION N/A 05/26/2017   Procedure: CESAREAN SECTION;  Surgeon: Artemisa Bile, MD;  Location: [facility] BIRTHING SUITES;  Service: Obstetrics;  Laterality: N/A;   DILATION AND CURETTAGE OF UTERUS N/A 06/12/2016   Procedure: DILATATION AND CURETTAGE;  Surgeon: Artemisa Bile, MD;  Location: [facility] ORS;  Service: Gynecology;  Laterality: N/A;   TONSILLECTOMY     WISDOM TOOTH EXTRACTION     Social History:  reports that she has never smoked. She has never used smokeless tobacco. She reports that she does not drink alcohol and does not use drugs.  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    Penicillins Rash and Other (See Comments)    Has patient had a PCN reaction causing immediate rash, facial/tongue/throat swelling, SOB or lightheadedness with hypotension: No Has patient had a PCN reaction causing severe rash involving mucus membranes or skin necrosis: No Has patient had a PCN reaction that required hospitalization: No Has patient had a PCN reaction occurring within the last 10 years: Yes If all of the above answers are NO, then may proceed with Cephalosporin use.    Strawberry (Diagnostic) Itching    Family History  Problem Relation Age of Onset   Dementia  Mother    Hypertension Other    Cancer Other     Prior to Admission medications   Medication Sig Start Date End Date Taking? Authorizing Provider  ibuprofen  (ADVIL ) 800 MG tablet Take 1 tablet (800 mg total) by mouth every 8 (eight) hours as needed. Patient not taking: Reported on 03/24/2024 05/20/21   Shivaji, Martin Slay, MD  Iron -FA-B Cmp-C-Biot-Probiotic (FUSION PLUS) CAPS Take 1 capsule by mouth daily.    [provider]   NIFEdipine  (ADALAT  CC) 60 MG 24 hr tablet Take 1 tablet (60 mg total) by mouth in the morning and at bedtime. Patient not taking: Reported on 03/24/2024 05/20/21   Shivaji, Martin Slay, MD  Prenatal MV-Min-FA-Omega-3 (PRENATAL GUMMIES/DHA & FA) 0.4-32.5 MG CHEW Chew 2 each by mouth daily.    [provider]  tirzepatide  (ZEPBOUND ) 7.5 MG/0.5ML Pen Inject 7.5 mg into the skin every 7 (seven) days. Patient not taking: Reported on 03/24/2024 12/10/22       Physical Exam: Vitals:   04/09/24 0015 04/09/24 0452 04/09/24 0807 04/09/24 1125  BP: 125/64 (!) 151/82 133/68 117/67  Pulse: 72 80 75 74  Resp: 18 16 16 18   Temp: 97.9 F (36.6 C) 98 F (36.7 C) 98.3 F (36.8 C) 98.3 F (36.8 C)  TempSrc: Oral Oral Oral Oral  SpO2: 97% 98% 100% 100%  Weight:      Height:       General; NAD CVS; S 1, S2  RRR Lungs; CTA Abdomen: Gravid  abdomen. NT Extremities; no edema  Data Reviewed:   Labs reviewed.     Family Communication:  Primary team communication: Discussed with Dr Dione Franks Thank you very much for involving us  in the care of your patient.  Author: Danette Duos, MD 04/09/2024 11:34 AM  For on call review [email or web address].

## 2024-04-10 ENCOUNTER — Inpatient Hospital Stay (HOSPITAL_COMMUNITY)

## 2024-04-10 DIAGNOSIS — E876 Hypokalemia: Secondary | ICD-10-CM | POA: Diagnosis not present

## 2024-04-10 LAB — CBC
HCT: 31.9 % — ABNORMAL LOW (ref 36.0–46.0)
Hemoglobin: 9.9 g/dL — ABNORMAL LOW (ref 12.0–15.0)
MCH: 24.8 pg — ABNORMAL LOW (ref 26.0–34.0)
MCHC: 31 g/dL (ref 30.0–36.0)
MCV: 79.9 fL — ABNORMAL LOW (ref 80.0–100.0)
Platelets: 187 10*3/uL (ref 150–400)
RBC: 3.99 MIL/uL (ref 3.87–5.11)
RDW: 21.9 % — ABNORMAL HIGH (ref 11.5–15.5)
WBC: 6.8 10*3/uL (ref 4.0–10.5)
nRBC: 0 % (ref 0.0–0.2)

## 2024-04-10 LAB — COMPREHENSIVE METABOLIC PANEL WITH GFR
ALT: 12 U/L (ref 0–44)
AST: 17 U/L (ref 15–41)
Albumin: 2.7 g/dL — ABNORMAL LOW (ref 3.5–5.0)
Alkaline Phosphatase: 62 U/L (ref 38–126)
Anion gap: 8 (ref 5–15)
BUN: 9 mg/dL (ref 6–20)
CO2: 20 mmol/L — ABNORMAL LOW (ref 22–32)
Calcium: 11.3 mg/dL — ABNORMAL HIGH (ref 8.9–10.3)
Chloride: 109 mmol/L (ref 98–111)
Creatinine, Ser: 0.62 mg/dL (ref 0.44–1.00)
GFR, Estimated: >60 mL/min (ref >60)
Glucose, Bld: 94 mg/dL (ref 70–99)
Potassium: 4.1 mmol/L (ref 3.5–5.1)
Sodium: 137 mmol/L (ref 135–145)
Total Bilirubin: 0.9 mg/dL (ref 0.0–1.2)
Total Protein: 6.3 g/dL — ABNORMAL LOW (ref 6.5–8.1)

## 2024-04-10 LAB — BASIC METABOLIC PANEL WITH GFR
Anion gap: 5 (ref 5–15)
BUN: 6 mg/dL (ref 6–20)
CO2: 19 mmol/L — ABNORMAL LOW (ref 22–32)
Calcium: 10.7 mg/dL — ABNORMAL HIGH (ref 8.9–10.3)
Chloride: 111 mmol/L (ref 98–111)
Creatinine, Ser: 0.6 mg/dL (ref 0.44–1.00)
GFR, Estimated: >60 mL/min (ref >60)
Glucose, Bld: 94 mg/dL (ref 70–99)
Potassium: 4.2 mmol/L (ref 3.5–5.1)
Sodium: 135 mmol/L (ref 135–145)

## 2024-04-10 LAB — ECHOCARDIOGRAM COMPLETE
Area-P 1/2: 3.77 cm2
Height: 71 in
S' Lateral: 3.4 cm
Single Plane A4C EF: 51.2 %
Weight: 5696.02 [oz_av]

## 2024-04-10 LAB — MAGNESIUM: Magnesium: 1.6 mg/dL — ABNORMAL LOW (ref 1.7–2.4)

## 2024-04-10 MED ORDER — MAGNESIUM SULFATE 2 GM/50ML IV SOLN
2.0000 g | Freq: Once | INTRAVENOUS | Status: AC
  Administered 2024-04-10: 2 g via INTRAVENOUS
  Filled 2024-04-10: qty 50

## 2024-04-10 NOTE — Progress Notes (Signed)
  Echocardiogram 2D Echocardiogram has been performed.  Beth [name] 04/10/2024, 8:45 AM

## 2024-04-10 NOTE — Progress Notes (Addendum)
 PROGRESS NOTE    Beth [name]  BJY:[id number] DOB: 01-30-1986 DOA: 03/24/2024 PCP: Patient, No Pcp Per   Brief Narrative: Beth [name] is a 38 y.o. female  G4P3 at 8 weeks past medical history significant for anxiety,  previous history of hypothyroidism, family history of renal potassium wasting, patient was admitted 5/29 with preeclampsia by OB.  We  were consulted 6/01 for hypokalemia.  She was treated with magnesium  and potassium supplements.  Her potassium has now remained in a good range.  Blood pressure has been better controlled.  Plan is to proceed with delivery at 34 weeks.  Patient has developed hypercalcemia.  We were consulted to assist with management.  Calcium  level has been ranging around 10.5----has been slowly increasing since 6/09 ---to 11----subsequently 13.6--- Calcium  correction by albumin 14.2   Assessment & Plan:   Active Problems:   * No active hospital problems. *  1-Hypercalcemia:  -Patient has develops Hypercalcemia over last several days/.  -Calcium  Correction by albumin 14.3 -PTH, PTH like peptide pending. , Vitamin D: 61, TSH. 0.6 -Continue with IV fluids and calcitonin BID> Monitor B-met twice a day.  -Maternal fetal specialist following as well.  -Corrected Ca by albumin: 11.3   2-Hypokalemia;  Resolved. Continue with supplements.    3-Preeclampsia Per [facility]  Left Ventricle Hypertrophy on EKG; ECHO ordered.   Hypomagnesemia; Replete IV>   Estimated body mass index is 49.65 kg/m as calculated from the following:   Height as of this encounter: 5' 11 (1.803 m).   Weight as of this encounter: 161.5 kg.  Subjective: She is alert, feels ok, no new complaints.   Objective: Vitals:   04/09/24 2004 04/10/24 0017 04/10/24 0501 04/10/24 0616  BP: 137/79 128/78 117/74 135/75  Pulse: 79 81 83 88  Resp: 18 18 18 18   Temp: 98 F (36.7 C) 98 F (36.7 C) 98 F (36.7 C)   TempSrc: Oral Oral Oral   SpO2:      Weight:      Height:       No intake or  output data in the 24 hours ending 04/10/24 0745 Filed Weights   03/24/24 1417  Weight: (!) 161.5 kg    Examination: General exam: Appears calm and comfortable  Respiratory system: Clear to auscultation. Respiratory effort normal. Cardiovascular system: S1 & S2 heard, RRR.  Gastrointestinal system: Abdomen is nondistended, soft and nontender. No organomegaly or masses felt. Normal bowel sounds heard. Central nervous system: Alert and oriented. No focal neurological deficits. Extremities: Symmetric 5 x 5 power.   Data Reviewed: I have personally reviewed following labs and imaging studies  CBC: Recent Labs  Lab 04/04/24 0519 04/07/24 0749  WBC 7.7 7.0  HGB 8.8* 11.0*  HCT 29.1* 35.4*  MCV 80.6 79.9*  PLT 166 192   Basic Metabolic Panel: Recent Labs  Lab 04/05/24 0542 04/06/24 0530 04/07/24 0612 04/07/24 0749 04/08/24 0545 04/09/24 0629 04/09/24 1735 04/10/24 0433  NA 135   < >  --  138 137 137 135 137  K 4.0   < >  --  4.3 4.3 4.1 4.1 4.1  CL 108   < >  --  108 108 108 108 109  CO2 21*   < >  --  22 21* 22 18* 20*  GLUCOSE 89   < >  --  101* 97 96 113* 94  BUN 7   < >  --  8 6 8 10 9   CREATININE 0.60   < >  --  0.72 0.64 0.65 0.60 0.62  CALCIUM  11.0*   < >  --  13.0* 13.2* 13.6* 12.7* 11.3*  MG 1.8  --  1.8  --  1.7 1.9  --  1.6*   < > = values in this interval not displayed.   GFR: Estimated Creatinine Clearance: 164.4 mL/min (by C-G formula based on SCr of 0.62 mg/dL). Liver Function Tests: Recent Labs  Lab 04/06/24 0530 04/07/24 0749 04/08/24 0545 04/09/24 0629 04/10/24 0433  AST 16 15 16 17 17   ALT 12 11 11 12 12   ALKPHOS 76 68 66 76 62  BILITOT 0.8 0.9 0.9 0.8 0.9  PROT 5.9* 6.4* 6.4* 6.2* 6.3*  ALBUMIN 2.6* 2.8* 2.8* 2.7* 2.7*   No results for input(s): LIPASE, AMYLASE in the last 168 hours. No results for input(s): AMMONIA in the last 168 hours. Coagulation Profile: No results for input(s): INR, PROTIME in the last 168  hours. Cardiac Enzymes: No results for input(s): CKTOTAL, CKMB, CKMBINDEX, TROPONINI in the last 168 hours. BNP (last 3 results) No results for input(s): PROBNP in the last 8760 hours. HbA1C: No results for input(s): HGBA1C in the last 72 hours. CBG: Recent Labs  Lab 04/05/24 0823  GLUCAP 128*   Lipid Profile: No results for input(s): CHOL, HDL, LDLCALC, TRIG, CHOLHDL, LDLDIRECT in the last 72 hours. Thyroid Function Tests: Recent Labs    04/09/24 1231  TSH 0.637   Anemia Panel: No results for input(s): VITAMINB12, FOLATE, FERRITIN, TIBC, IRON , RETICCTPCT in the last 72 hours. Sepsis Labs: No results for input(s): PROCALCITON, LATICACIDVEN in the last 168 hours.  No results found for this or any previous visit (from the past 240 hours).       Radiology Studies: No results found.      Scheduled Meds:  calcitonin  400 Units Intramuscular BID   docusate sodium   100 mg Oral Daily   famotidine   20 mg Oral Q12H   ferrous sulfate   325 mg Oral Q48H   labetalol   600 mg Oral Q8H   magnesium  oxide  400 mg Oral QID   potassium chloride   40 mEq Oral TID   prenatal multivitamin  1 tablet Oral Q1200   sodium chloride  flush  3 mL Intravenous Q12H   Continuous Infusions:  sodium chloride  125 mL/hr at 04/09/24 2309   magnesium  sulfate bolus IVPB       LOS: 17 days    Time spent: 35 minutes    Beth [name] A Donyel Castagnola, MD [facility]   If 7PM-7AM, please contact night-coverage [email or web address]  04/10/2024, 7:45 AM

## 2024-04-10 NOTE — Progress Notes (Signed)
 Patient ID: Beth [name], female   DOB: 1985/12/11, 38 y.o.   MRN: [id number]   38 yo G4P4 @ 32.3 HD#17 for preE w/ SF   S: Normal FM. No CTX, LOF, VB, HA, CP/SOB  Muscle fatigue improving. S/p Medicine consult, Echo completed this morning with normal ejection fraction.    O: Vitals:   04/10/24 0017 04/10/24 0501 04/10/24 0616 04/10/24 0757  BP: 128/78 117/74 135/75 101/60  Pulse: 81 83 88 81  Resp: 18 18 18 16   Temp: 98 F (36.7 C) 98 F (36.7 C)  98.1 F (36.7 C)  TempSrc: Oral Oral  Oral  SpO2:    98%  Weight:      Height:         Gen: alert, NAD, morbidly obese Abd: Gravid, soft, non-tender Ext: no edema, non-tender  NST: 125, moderate variability, accels present, no decelerations, category 1 Toco: quiet   Results for orders placed or performed during the hospital encounter of 03/24/24 (from the past 24 hours)  VITAMIN D 25 Hydroxy (Vit-D Deficiency, Fractures)     Status: None   Collection Time: 04/09/24 12:31 PM  Result Value Ref Range   Vit D, 25-Hydroxy 61.75 30 - 100 ng/mL  TSH     Status: None   Collection Time: 04/09/24 12:31 PM  Result Value Ref Range   TSH 0.637 0.350 - 4.500 uIU/mL  Basic metabolic panel with GFR     Status: Abnormal   Collection Time: 04/09/24  5:35 PM  Result Value Ref Range   Sodium 135 135 - 145 mmol/L   Potassium 4.1 3.5 - 5.1 mmol/L   Chloride 108 98 - 111 mmol/L   CO2 18 (L) 22 - 32 mmol/L   Glucose, Bld 113 (H) 70 - 99 mg/dL   BUN 10 6 - 20 mg/dL   Creatinine, Ser 4.09 0.44 - 1.00 mg/dL   Calcium  12.7 (H) 8.9 - 10.3 mg/dL   GFR, Estimated >81 >19 mL/min   Anion gap 9 5 - 15  Magnesium      Status: Abnormal   Collection Time: 04/10/24  4:33 AM  Result Value Ref Range   Magnesium  1.6 (L) 1.7 - 2.4 mg/dL  Comprehensive metabolic panel     Status: Abnormal   Collection Time: 04/10/24  4:33 AM  Result Value Ref Range   Sodium 137 135 - 145 mmol/L   Potassium 4.1 3.5 - 5.1 mmol/L   Chloride 109 98 - 111 mmol/L   CO2 20 (L)  22 - 32 mmol/L   Glucose, Bld 94 70 - 99 mg/dL   BUN 9 6 - 20 mg/dL   Creatinine, Ser 1.47 0.44 - 1.00 mg/dL   Calcium  11.3 (H) 8.9 - 10.3 mg/dL   Total Protein 6.3 (L) 6.5 - 8.1 g/dL   Albumin 2.7 (L) 3.5 - 5.0 g/dL   AST 17 15 - 41 U/L   ALT 12 0 - 44 U/L   Alkaline Phosphatase 62 38 - 126 U/L   Total Bilirubin 0.9 0.0 - 1.2 mg/dL   GFR, Estimated >82 >95 mL/min   Anion gap 8 5 - 15  CBC     Status: Abnormal   Collection Time: 04/10/24  8:03 AM  Result Value Ref Range   WBC 6.8 4.0 - 10.5 K/uL   RBC 3.99 3.87 - 5.11 MIL/uL   Hemoglobin 9.9 (L) 12.0 - 15.0 g/dL   HCT 62.1 (L) 30.8 - 65.7 %   MCV 79.9 (L) 80.0 - 100.0  fL   MCH 24.8 (L) 26.0 - 34.0 pg   MCHC 31.0 30.0 - 36.0 g/dL   RDW 78.2 (H) 95.6 - 21.3 %   Platelets 187 150 - 400 K/uL   nRBC 0.0 0.0 - 0.2 %      A/P: 38 yo G4P3 @ 32.3 admitted for PreE w/ SF   1) Preeclampsia: Current regimen: Labetalol  600 TID BID. Do not take BP while side lying or patient sleeping as this can falsely lower BP.  Patient has a history of severe headaches with Procardia  therefore labetalol  was the medication of choice for antihypertensive.  Labetalol  was titrated to maximum dose on 04/02/24, required slight decrease but is now stable at above regimen - Currently on 600mg  TID - Continue inpatient management through delivery - Given severe preeclampsia will proceed with delivery at 34 weeks - Status post course of betamethasone  on 5/29 and 5/30.  Plan rescue course of betamethasone  at 33+5 and 33+6   2) Hypokalemia: Potassium now in normal range on potassium 40 mEq TID.  -From hospitalist consult note 6/6: Spot urinary potassium is appropriate and appears to rule out renal potassium wasting disorder May also be associated with poor absorption related to hypomagnesemia Cortisol level low-normal, cosyntropin  stimulation test with cortisol >20 at 1 hour (22) - still low-normal Mag++ has remained low and is now repleted with 400 mg QID K+  supplementation is now at 40 mEq QID Will need ongoing close monitoring with likely daily labs while hospitalized If this is related to pseudohyperaldosteronism from pregnancy, it may resolve after delivery Will refer to endocrinology as an outpatient Given normalization of K+ (finally!), TRH will sign off Should electrolytes become abnormal again or other new medical problems, please re-consult   3) Hypomagnesia, minimally low today at 1.6 however gross hypomagnesemia resolved: Patient on magnesium  oxide 400 mg 4 times daily   4) Anemia of pregnancy: Status post IV iron  x 2.  First infusion on 5/30, second infusion 6/6 - Improved to 11.0 on 04/07/24   5) History of persistent headache: Has been having persistent headaches since early in the pregnancy.  Saw neurology in consultation on the day of admission for persistent headache.  Now resolved.  Status post normal MRI of brain on hospital day #4. - Tylenol  and Fioricet  as needed  6) Hypercalcemia: Trending up since 6/7. S/p Hospitalist re-consult, appreciate recs. Started on calcitonin and IV fluids. Levels this AM at 11.3. Continue meds/fluids. Pt with improving muscle weakness. Echo WNL. TSH, VitD WNL   7) MOD: Desires VBAC.  History of 2 prior successful vaginal deliveries 1 of which was a VBAC.  Pelvis proven to 10 pounds.  Baby breech/transverse on last imaging study  GS ordered on 6/20, BPP ordered for 6/16

## 2024-04-11 ENCOUNTER — Inpatient Hospital Stay (HOSPITAL_BASED_OUTPATIENT_CLINIC_OR_DEPARTMENT_OTHER)

## 2024-04-11 ENCOUNTER — Inpatient Hospital Stay (HOSPITAL_COMMUNITY)

## 2024-04-11 DIAGNOSIS — E669 Obesity, unspecified: Secondary | ICD-10-CM

## 2024-04-11 DIAGNOSIS — O09523 Supervision of elderly multigravida, third trimester: Secondary | ICD-10-CM | POA: Diagnosis not present

## 2024-04-11 DIAGNOSIS — E876 Hypokalemia: Secondary | ICD-10-CM | POA: Diagnosis not present

## 2024-04-11 DIAGNOSIS — Z3A32 32 weeks gestation of pregnancy: Secondary | ICD-10-CM

## 2024-04-11 DIAGNOSIS — O133 Gestational [pregnancy-induced] hypertension without significant proteinuria, third trimester: Secondary | ICD-10-CM

## 2024-04-11 DIAGNOSIS — O99213 Obesity complicating pregnancy, third trimester: Secondary | ICD-10-CM

## 2024-04-11 LAB — COMPREHENSIVE METABOLIC PANEL WITH GFR
ALT: 11 U/L (ref 0–44)
AST: 16 U/L (ref 15–41)
Albumin: 2.4 g/dL — ABNORMAL LOW (ref 3.5–5.0)
Alkaline Phosphatase: 70 U/L (ref 38–126)
Anion gap: 5 (ref 5–15)
BUN: <5 mg/dL — ABNORMAL LOW (ref 6–20)
CO2: 19 mmol/L — ABNORMAL LOW (ref 22–32)
Calcium: 10 mg/dL (ref 8.9–10.3)
Chloride: 110 mmol/L (ref 98–111)
Creatinine, Ser: 0.55 mg/dL (ref 0.44–1.00)
GFR, Estimated: >60 mL/min (ref >60)
Glucose, Bld: 100 mg/dL — ABNORMAL HIGH (ref 70–99)
Potassium: 3.9 mmol/L (ref 3.5–5.1)
Sodium: 134 mmol/L — ABNORMAL LOW (ref 135–145)
Total Bilirubin: 0.5 mg/dL (ref 0.0–1.2)
Total Protein: 5.4 g/dL — ABNORMAL LOW (ref 6.5–8.1)

## 2024-04-11 LAB — PARATHYROID HORMONE, INTACT (NO CA): PTH: 46 pg/mL (ref 15–65)

## 2024-04-11 LAB — TYPE AND SCREEN
ABO/RH(D): O POS
Antibody Screen: NEGATIVE

## 2024-04-11 LAB — MAGNESIUM: Magnesium: 1.5 mg/dL — ABNORMAL LOW (ref 1.7–2.4)

## 2024-04-11 MED ORDER — MAGNESIUM SULFATE 2 GM/50ML IV SOLN
2.0000 g | Freq: Once | INTRAVENOUS | Status: AC
  Filled 2024-04-11: qty 50

## 2024-04-11 NOTE — Progress Notes (Signed)
 Patient ID: Beth [name], female   DOB: 1986/05/10, 38 y.o.   MRN: [id number]   38 yo G4P3003 @ 32.4 HD#18 for preE w/ SF   S: Normal FM. No CTX, LOF, VB, HA, CP/SOB, RUQ pain. Cash reports some short-lived episodes of palm itching over the last couple of days. They have resolved currently.   O: Vitals:   04/11/24 0810 04/11/24 1139 04/11/24 1200 04/11/24 1537  BP: 124/71 126/69 139/85 119/65  Pulse: 75 82 78 78  Resp: 18 16  16   Temp: 97.6 F (36.4 C) 98.2 F (36.8 C)  97.9 F (36.6 C)  TempSrc: Oral Oral  Oral  SpO2: 100% 99%  99%  Weight:      Height:         Physical Exam:  General: no acute distress Pulm: normal work of breathing on room air Card: well perfused MSK: normal ROM Neuro: no focal deficits, oriented x3 Psych: normal mood, normal thought  NST: 125, moderate variability, accels present, no decelerations, category 1 Toco: quiet   Results for orders placed or performed during the hospital encounter of 03/24/24 (from the past 24 hours)  Basic metabolic panel with GFR     Status: Abnormal   Collection Time: 04/10/24  5:14 PM  Result Value Ref Range   Sodium 135 135 - 145 mmol/L   Potassium 4.2 3.5 - 5.1 mmol/L   Chloride 111 98 - 111 mmol/L   CO2 19 (L) 22 - 32 mmol/L   Glucose, Bld 94 70 - 99 mg/dL   BUN 6 6 - 20 mg/dL   Creatinine, Ser 8.29 0.44 - 1.00 mg/dL   Calcium  10.7 (H) 8.9 - 10.3 mg/dL   GFR, Estimated >56 >21 mL/min   Anion gap 5 5 - 15  Type and screen [facility]     Status: None   Collection Time: 04/11/24  4:31 AM  Result Value Ref Range   ABO/RH(D) O POS    Antibody Screen NEG    Sample Expiration      04/14/2024,2359 Performed at [facility], [address], [place], [place] [address]   Magnesium      Status: Abnormal   Collection Time: 04/11/24  4:34 AM  Result Value Ref Range   Magnesium  1.5 (L) 1.7 - 2.4 mg/dL  Comprehensive metabolic panel     Status: Abnormal   Collection Time: 04/11/24  4:34 AM   Result Value Ref Range   Sodium 134 (L) 135 - 145 mmol/L   Potassium 3.9 3.5 - 5.1 mmol/L   Chloride 110 98 - 111 mmol/L   CO2 19 (L) 22 - 32 mmol/L   Glucose, Bld 100 (H) 70 - 99 mg/dL   BUN <5 (L) 6 - 20 mg/dL   Creatinine, Ser 7.84 0.44 - 1.00 mg/dL   Calcium  10.0 8.9 - 10.3 mg/dL   Total Protein 5.4 (L) 6.5 - 8.1 g/dL   Albumin 2.4 (L) 3.5 - 5.0 g/dL   AST 16 15 - 41 U/L   ALT 11 0 - 44 U/L   Alkaline Phosphatase 70 38 - 126 U/L   Total Bilirubin 0.5 0.0 - 1.2 mg/dL   GFR, Estimated >69 >62 mL/min   Anion gap 5 5 - 15      A/P: 38 yo G4P3 @ 32.4 admitted for PreE w/ SF, HD#18   1) Preeclampsia: Current regimen: Labetalol  600 TID. Do not take BP while side lying or patient sleeping as this can falsely  lower BP.  Patient has a history of severe headaches with Procardia  therefore labetalol  was the medication of choice for antihypertensive.  Labetalol  was titrated to maximum dose on 04/02/24, required slight decrease but is now stable at above regimen - Twice weekly BPP (8 out of 8 today, next 6/20), growth scan ordered for 6/20. - Continue inpatient management through delivery - Given severe preeclampsia will proceed with delivery at 34 weeks - Status post course of betamethasone  on 5/29 and 5/30.  Plan rescue course of betamethasone  at 33+5 and 33+6, or earlier if delivery is indicated sooner.   2) Hypokalemia: Potassium now in normal range on potassium 40 mEq TID. See hospitalist consult note from 6/6 for further details.   3) Hypomagnesia, minimally low today at 1.5 however gross hypomagnesemia resolved. Patient on magnesium  oxide 400 mg 4 times daily   4) Anemia of pregnancy: Status post IV iron  x 2.  First infusion on 5/30, second infusion 6/6 - Improved to 11.0 on 04/07/24, back down to 9.9 on 04/11/24. Likely dilutional in s/o IVF for hypercalcemia. Monitor for now, consider an additional iron  infusion if remains low.   5) History of persistent headache: Has been having  persistent headaches since early in the pregnancy.  Saw neurology in consultation on the day of admission for persistent headache.  Now resolved.  Status post normal MRI of brain on hospital day #4. - Tylenol  and Fioricet  as needed  6) Hypercalcemia/hyperparathyroidism: Trending up since 6/7. S/p Hospitalist re-consult, appreciate recs. Levels this AM at 10.0 after calcitonin and fluids.  Muscle weakness resolved. Echo WNL. TSH, VitD WNL. PTH 46, per hospitalist Dr. Landrum Pink and endocrinologist Dr. Kathyanne Parkers, this is consistent with hyperparathyroidism. 24 hr urine calcium  and creatinine ordered, endocrine to see inpatient. Hospitalist also requests OB place general surgery consult; call placed, have not heard back yet.   7) Routine prenatal care/ MOD: q72 hour T&S. Desires VBAC. History of 2 prior successful vaginal deliveries 1 of which was a VBAC.  Pelvis proven to 10 pounds.  Baby VERTEX on 6/16!

## 2024-04-11 NOTE — Consult Note (Signed)
 Reason for Consult: Hypercalcemia. Referring Physician: Dr. Landrum Pink, [facility]  Beth [name] is an 38 y.o. female.  HPI: Beth [name] notes that she was admitted due to hypertension during pregnancy.  She describes how this led to discovery and treatment of hypokalemia and hypomagnesemia.  Associated symptoms include feeling tired.  Those electrolytes improved, but hypercalcemia developed and worsened.  No history of hypercalcemia prior to this pregnancy.  However, she has a history of kidney stones years ago, including 2008.  She had transient hypothyroidism after IUD removal.   No known family history of hypercalcemia, endocrine neoplasia, or osteoporosis.  Beth patient does not consume anise in foods or beverages.  No use of a diuretic except in September and October 2024.  She is less physically activity during [name] stay, but drinking plenty of water.  To her knowledge, her unborn son is doing well including his fetal ultrasound measurements.     Past Medical History:  Diagnosis Date   Anemia    Anxiety    Bulging lumbar disc    Headache    otc med prn - last one 4 months ago   History of blood transfusion    after 04/02/2016 svd at [name]   Seizures ([name])    Childhood   SVD (spontaneous vaginal delivery)    x 1    Past Surgical History:  Procedure Laterality Date   CESAREAN SECTION N/A 05/26/2017   Procedure: CESAREAN SECTION;  Surgeon: Beth Bile, MD;  Location: [name];  Service: Obstetrics;  Laterality: N/A;   DILATION AND CURETTAGE OF UTERUS N/A 06/12/2016   Procedure: DILATATION AND CURETTAGE;  Surgeon: Beth Bile, MD;  Location: [name];  Service: Gynecology;  Laterality: N/A;   TONSILLECTOMY     WISDOM TOOTH EXTRACTION      Family History  Problem Relation Age of Onset   Dementia Mother    Hypertension Other    Cancer Other   Mother: Beth [name]' disease. Uncle: with a disorder causing renal potassium wasting while also causing heart failure.  Father:  esophagus cancer, then prostate cancer metastatic to bone.    Social History:  reports that she has never smoked. She has never used smokeless tobacco. She reports that she does not drink alcohol and does not use drugs.  Married with three children.  She works in [name], mostly from home.    Allergies:  Allergies  Allergen Reactions   Food Anaphylaxis and Other (See Comments)    Pt is allergic to kiwi.    Penicillins Rash and Other (See Comments)    Has patient had a PCN reaction causing immediate rash, facial/tongue/throat swelling, SOB or lightheadedness with hypotension: No Has patient had a PCN reaction causing severe rash involving mucus membranes or skin necrosis: No Has patient had a PCN reaction that required hospitalization: No Has patient had a PCN reaction occurring within Beth last 10 years: Yes If all of Beth above answers are NO, then may proceed with Cephalosporin use.    Strawberry (Diagnostic) Itching    Medications: I have reviewed Beth patient's current medications.  Results for orders placed or performed during Beth [name] encounter of 03/24/24 (from Beth past 48 hours)  Magnesium      Status: Abnormal   Collection Time: 04/10/24  4:33 AM  Result Value Ref Range   Magnesium  1.6 (L) 1.7 - 2.4 mg/dL    Comment: Performed at [name], [address] [place], [place] [address]  Comprehensive metabolic panel  Status: Abnormal   Collection Time: 04/10/24  4:33 AM  Result Value Ref Range   Sodium 137 135 - 145 mmol/L   Potassium 4.1 3.5 - 5.1 mmol/L   Chloride 109 98 - 111 mmol/L   CO2 20 (L) 22 - 32 mmol/L   Glucose, Bld 94 70 - 99 mg/dL    Comment: Glucose reference range applies only to samples taken after fasting for at least 8 hours.   BUN 9 6 - 20 mg/dL   Creatinine, Ser 1.61 0.44 - 1.00 mg/dL   Calcium  11.3 (H) 8.9 - 10.3 mg/dL   Total Protein 6.3 (L) 6.5 - 8.1 g/dL   Albumin 2.7 (L) 3.5 - 5.0 g/dL   AST 17 15 - 41 U/L   ALT 12 0 - 44 U/L    Alkaline Phosphatase 62 38 - 126 U/L   Total Bilirubin 0.9 0.0 - 1.2 mg/dL   GFR, Estimated >09 >60 mL/min    Comment: (NOTE) Calculated using Beth CKD-EPI Creatinine Equation (2021)    Anion gap 8 5 - 15    Comment: Performed at [name], [address] [place], [place] [address]  CBC     Status: Abnormal   Collection Time: 04/10/24  8:03 AM  Result Value Ref Range   WBC 6.8 4.0 - 10.5 K/uL   RBC 3.99 3.87 - 5.11 MIL/uL   Hemoglobin 9.9 (L) 12.0 - 15.0 g/dL   HCT 81.1 (L) 91.4 - 78.2 %   MCV 79.9 (L) 80.0 - 100.0 fL   MCH 24.8 (L) 26.0 - 34.0 pg   MCHC 31.0 30.0 - 36.0 g/dL   RDW 95.6 (H) 21.3 - 08.6 %   Platelets 187 150 - 400 K/uL   nRBC 0.0 0.0 - 0.2 %    Comment: Performed at [name], [address], [place], [place] [address]  Basic metabolic panel with GFR     Status: Abnormal   Collection Time: 04/10/24  5:14 PM  Result Value Ref Range   Sodium 135 135 - 145 mmol/L   Potassium 4.2 3.5 - 5.1 mmol/L   Chloride 111 98 - 111 mmol/L   CO2 19 (L) 22 - 32 mmol/L   Glucose, Bld 94 70 - 99 mg/dL    Comment: Glucose reference range applies only to samples taken after fasting for at least 8 hours.   BUN 6 6 - 20 mg/dL   Creatinine, Ser 9.62 0.44 - 1.00 mg/dL   Calcium  10.7 (H) 8.9 - 10.3 mg/dL   GFR, Estimated >95 >28 mL/min    Comment: (NOTE) Calculated using Beth CKD-EPI Creatinine Equation (2021)    Anion gap 5 5 - 15    Comment: Performed at [name], [address], [place], [place] [address]  Type and screen [name]     Status: None   Collection Time: 04/11/24  4:31 AM  Result Value Ref Range   ABO/RH(D) O POS    Antibody Screen NEG    Sample Expiration      04/14/2024,2359 Performed at [name], [address] [place], [place] [address]   Magnesium      Status: Abnormal   Collection Time: 04/11/24  4:34 AM  Result Value Ref Range   Magnesium  1.5 (L) 1.7 - 2.4 mg/dL    Comment: Performed at [name], [address], [place], [place] [address]  Comprehensive metabolic panel  Status: Abnormal   Collection Time: 04/11/24  4:34 AM  Result Value Ref Range   Sodium 134 (L) 135 - 145 mmol/L   Potassium 3.9 3.5 - 5.1 mmol/L   Chloride 110 98 - 111 mmol/L   CO2 19 (L) 22 - 32 mmol/L   Glucose, Bld 100 (H) 70 - 99 mg/dL    Comment: Glucose reference range applies only to samples taken after fasting for at least 8 hours.   BUN <5 (L) 6 - 20 mg/dL   Creatinine, Ser 1.61 0.44 - 1.00 mg/dL   Calcium  10.0 8.9 - 10.3 mg/dL   Total Protein 5.4 (L) 6.5 - 8.1 g/dL   Albumin 2.4 (L) 3.5 - 5.0 g/dL   AST 16 15 - 41 U/L   ALT 11 0 - 44 U/L   Alkaline Phosphatase 70 38 - 126 U/L   Total Bilirubin 0.5 0.0 - 1.2 mg/dL   GFR, Estimated >09 >60 mL/min    Comment: (NOTE) Calculated using Beth CKD-EPI Creatinine Equation (2021)    Anion gap 5 5 - 15    Comment: Performed at [name], [address] [place], [place] [address]    US  MFM FETAL BPP WO NON STRESS Result Date: 04/11/2024 ----------------------------------------------------------------------  OBSTETRICS REPORT                        (Signed Final 04/11/2024 09:11 am) ---------------------------------------------------------------------- Patient Info  ID #:       [id number]                          D.O.B.:  26-Jan-1986 (37 yrs)(F)  Name:       Beth [name]                    Visit Date: 04/11/2024 07:59 am ---------------------------------------------------------------------- Performed By  Attending:        Sal Crass MD         Ref. Address:      [facility]                                                              [facility]                                                              [address]  [id number]  Performed By:     Drexel Gentles          Secondary Phy.:    [name]                     [facility]                                                              [name]  Referred By:      Luz Sames             [facility]                    MARINONE MD                               [name] ---------------------------------------------------------------------- Orders  #  Description                           Code        Ordered By  1  US  MFM FETAL BPP WO NON               76819.01    [name] SHIVAJI     STRESS ----------------------------------------------------------------------  #  Order #                     Accession #                Episode #  1  [id number]                   [id number]                 [id number] ---------------------------------------------------------------------- Indications  Hypertension - Gestational                      O4.9  Advanced maternal age multigravida 19+,         O49.523  third trimester  Obesity complicating pregnancy, third           O99.213  trimester  Poor obstetric history: Prior fetal             O09.299  macrosomia, antepartum  Encounter for antenatal screening,              Z36.9  unspecified  [redacted] weeks gestation of pregnancy                 Z3A.32 ---------------------------------------------------------------------- Fetal Evaluation  Num Of Fetuses:          1  Fetal Heart Rate(bpm):   142  Cardiac Activity:        Observed  Presentation:            Cephalic  Placenta:                Posterior  P. Cord Insertion:       Previously seen  Amniotic Fluid  AFI FV:      Within normal limits  AFI Sum(cm)     %Tile       Largest Pocket(cm)  15.6  56          5.8  RUQ(cm)       RLQ(cm)       LUQ(cm)        LLQ(cm)  5.2           0.7           3.9            5.8 ---------------------------------------------------------------------- Biophysical Evaluation  Amniotic F.V:   Pocket => 2 cm             F. Tone:         Observed  F. Movement:    Observed                   Score:           8/8  F. Breathing:   Observed  ---------------------------------------------------------------------- OB History  Gravidity:    4         Term:   3  Living:       3 ---------------------------------------------------------------------- Gestational Age  Clinical EDD:  32w 5d                                        EDD:   06/01/24  Best:          32w 5d     Det. By:  Clinical EDD             EDD:   06/01/24 ---------------------------------------------------------------------- Anatomy  Cranium:               Appears normal         Stomach:                Appears normal, left                                                                        sided  Thoracic:              Appears normal         Kidneys:                Appear normal  RVOT:                  Appears normal         Bladder:                Appears normal  Diaphragm:             Appears normal ---------------------------------------------------------------------- Cervix Uterus Adnexa  Cervix  Not visualized (advanced GA >24wks)  Uterus  No abnormality visualized.  Right Ovary  Not visualized.  Left Ovary  Not visualized.  Cul De Sac  No free fluid seen.  Adnexa  No abnormality visualized ---------------------------------------------------------------------- Comments  This patient has been hospitalized due to preeclampsia.  A biophysical profile performed today was 8/8.  There was normal amniotic fluid noted with an  AFI of 15.6  cm.  Beth fetus is in Beth vertex presentation. ----------------------------------------------------------------------  Sal Crass, MD Electronically Signed Final Report   04/11/2024 09:11 am ----------------------------------------------------------------------   ECHOCARDIOGRAM COMPLETE Result Date: 04/10/2024    ECHOCARDIOGRAM REPORT   Patient Name:   Beth [name] Date of Exam: 04/10/2024 Medical Rec #:  [id number]    Height:       71.0 in Accession #:    [id number]   Weight:       356.0 lb Date of Birth:  1985-11-27    BSA:          2.694 m  Patient Age:    37 years     BP:           135/75 mmHg Patient Gender: F            HR:           76 bpm. Exam Location:  Inpatient Procedure: 2D Echo (Both Spectral and Color Flow Doppler were utilized during            procedure). Indications:     abnormal ecg  History:         Patient has no prior history of Echocardiogram examinations.                  Signs/Symptoms:prengnancy.  Sonographer:     Dione Franks RDCS Referring Phys:  [id number]O Diagnosing Phys: Pasqual Bone MD IMPRESSIONS  1. Left ventricular ejection fraction, by estimation, is 55 to 60%. Beth left ventricle has normal function. Beth left ventricle has no regional wall motion abnormalities. There is mild concentric left ventricular hypertrophy. Left ventricular diastolic parameters were normal.  2. Right ventricular systolic function is normal. Beth right ventricular size is normal. There is normal pulmonary artery systolic pressure.  3. Left atrial size was mild to moderately dilated.  4. Right atrial size was mildly dilated.  5. Beth mitral valve is normal in structure. Mild mitral valve regurgitation.  6. Beth aortic valve is tricuspid. Aortic valve regurgitation is not visualized.  7. Beth inferior vena cava is dilated in size with <50% respiratory variability, suggesting right atrial pressure of 15 mmHg. FINDINGS  Left Ventricle: Left ventricular ejection fraction, by estimation, is 55 to 60%. Beth left ventricle has normal function. Beth left ventricle has no regional wall motion abnormalities. Beth left ventricular internal cavity size was normal in size. There is  mild concentric left ventricular hypertrophy. Left ventricular diastolic parameters were normal. Right Ventricle: Beth right ventricular size is normal. No increase in right ventricular wall thickness. Right ventricular systolic function is normal. There is normal pulmonary artery systolic pressure. Beth tricuspid regurgitant velocity is 2.29 m/s, and  with an assumed right  atrial pressure of 8 mmHg, Beth estimated right ventricular systolic pressure is 29.0 mmHg. Left Atrium: Left atrial size was mild to moderately dilated. Right Atrium: Right atrial size was mildly dilated. Pericardium: There is no evidence of pericardial effusion. Mitral Valve: Beth mitral valve is normal in structure. Mild mitral valve regurgitation. Tricuspid Valve: Beth tricuspid valve is normal in structure. Tricuspid valve regurgitation is mild. Aortic Valve: Beth aortic valve is tricuspid. Aortic valve regurgitation is not visualized. Pulmonic Valve: Beth pulmonic valve was normal in structure. Pulmonic valve regurgitation is trivial. Aorta: Beth aortic root is normal in size and structure. Venous: Beth inferior vena cava is dilated in size with less than 50% respiratory variability, suggesting right atrial pressure of 15 mmHg. IAS/Shunts: Beth atrial septum is grossly normal.  LEFT VENTRICLE PLAX 2D LVIDd:  5.20 cm      Diastology LVIDs:         3.40 cm      LV e' medial:    7.51 cm/s LV PW:         1.30 cm      LV E/e' medial:  10.0 LV IVS:        1.30 cm      LV e' lateral:   12.70 cm/s LVOT diam:     2.40 cm      LV E/e' lateral: 5.9 LV SV:         77 LV SV Index:   29 LVOT Area:     4.52 cm  LV Volumes (MOD) LV vol d, MOD A4C: 173.0 ml LV vol s, MOD A4C: 84.4 ml LV SV MOD A4C:     173.0 ml RIGHT VENTRICLE             IVC RV Basal diam:  3.00 cm     IVC diam: 2.20 cm RV S prime:     16.40 cm/s TAPSE (M-mode): 2.6 cm LEFT ATRIUM             Index        RIGHT ATRIUM           Index LA diam:        3.90 cm 1.45 cm/m   RA Area:     19.10 cm LA Vol (A2C):   78.2 ml 29.03 ml/m  RA Volume:   52.90 ml  19.64 ml/m LA Vol (A4C):   58.7 ml 21.79 ml/m LA Biplane Vol: 73.6 ml 27.32 ml/m  AORTIC VALVE             PULMONIC VALVE LVOT Vmax:   103.00 cm/s PR End Diast Vel: 2.92 msec LVOT Vmean:  66.200 cm/s LVOT VTI:    0.171 m  AORTA Ao Root diam: 3.30 cm Ao Asc diam:  3.50 cm MITRAL VALVE               TRICUSPID  VALVE MV Area (PHT): 3.77 cm    TR Peak grad:   21.0 mmHg MV Decel Time: 201 msec    TR Vmax:        229.00 cm/s MV E velocity: 75.10 cm/s MV A velocity: 51.40 cm/s  SHUNTS MV E/A ratio:  1.46        Systemic VTI:  0.17 m                            Systemic Diam: 2.40 cm Pasqual Bone MD Electronically signed by Pasqual Bone MD Signature Date/Time: 04/10/2024/9:46:28 AM    Final     Review of Systems No fragility fractures, but history of finger fractures from accidental injury when walking a dog she rescued.  Constipation.  Polyuria and polydipsia but those have improved.  No diarrhea, dysuria, or hematuria.  She had intentionally shed 90 pounds before pregnancy, but gained a lot of weight after IVF.    Blood pressure 119/65, pulse 78, temperature 97.9 F (36.6 C), temperature source Oral, resp. rate 16, height 5' 11 (1.803 m), weight (!) 161.5 kg, SpO2 99%, unknown if currently breastfeeding. Physical Exam General: No apparent distress. Eyes: Extraocular eye movements intact, pupils equal and round. Neck: Supple, trachea midline. Thyroid: Mild relatively symmetric enlargement, mobile without fixation, no tenderness. Cardiovascular: Regular rhythm and rate, no murmur, normal radial pulses. Respiratory: Normal respiratory effort,  clear to auscultation. Neurologic: Cranial nerves normal as tested, bicep deep tendon reflexes 2+, no tremor. Musculoskeletal: Normal muscle tone. Skin: Appropriate warmth. Mental status: Alert, conversant, speech clear, thought logical, appropriate mood and affect, no hallucinations or delusions evident. Hematologic/lymphatic: No apparent cervical adenopathy, no visible ecchymoses.  Assessment/Plan: Primary hyperparathyroidism with hypercalcemia. Hypomagnesemia  Hypokalemia History of kidney stones Personal history of hypothyroidism Family history of Graves' disease (her mother)  Beth intact PTH (parathyroid hormone) level was inappropriate at 46 during  hypercalcemia.  With PTH-independent causes of hypercalcemia, PTH level is usually below 15 or at least below 20.  Primary hyperparathyroidism may have contributed to her history of kidney stones.   With this clinical presentation, familial hypocalciuric hypercalcemia seems unlikely and acquired hypocalciuric hypercalcemia seems unlikely.    Informed consent obtained after discussion of anticipated benefits, alternatives, nature of tests, and risks--including but not limited to misleading result, etcetera from:  24-hour urine calcium  with 24-hour urine creatinine. Neck ultrasound to check thyroid gland and for possible localization of a parathyroid adenoma.  I spoke with Lelia in detail about Beth working diagnosis, prognosis, risks for her, and risks for her child.  Primary hyperthyroidism may contribute to bone loss, fractures, kidney stones, and symptoms from hypercalcemia for her.  Primary hyperparathyroidism with hypercalcemia may contribute to neonatal hypocalcemia, preterm delivery, low birth weight, and even fetal demise.  I am grateful that her hypercalcemia has improved.   Dehydration and immobility may worsen hypercalcemia.  She agreed to drink plenty of water and continue with weight-bearing (standing, walking in [name] room, etc.).  Bisphosphonates, denosumab, and cinacalcet are not recommended during pregnancy.   Parathyroid surgery during Beth second trimester is Beth preferred treatment for symptomatic patients.  Since she is into Beth third trimester, I recommend consultation with a surgeon, but would leave Beth timing of surgery to Beth surgeon in consultation with Beth obstetrician.   Oyinkansola Truax 04/11/2024, 6:37 PM   [92 minutes devoted to pre-visit, face-to-face, and post-visit patient care on April 11, 2024]

## 2024-04-11 NOTE — Progress Notes (Signed)
 PROGRESS NOTE    Beth [name]  QMV:[id number] DOB: 11-Apr-1986 DOA: 03/24/2024 PCP: Patient, No Pcp Per   Brief Narrative: Beth [name] is Beth 38 y.o. female  G4P3 at 40 weeks past medical history significant for anxiety,  previous history of hypothyroidism, family history of renal potassium wasting, patient was admitted 5/29 with preeclampsia by OB.  We  were consulted 6/01 for hypokalemia.  She was treated with magnesium  and potassium supplements.  Her potassium has now remained in Beth good range.  Blood pressure has been better controlled.  Plan is to proceed with delivery at 34 weeks.  Patient has developed hypercalcemia.  We were consulted to assist with management.  Calcium  level has been ranging around 10.5----has been slowly increasing since 6/09 ---to 11----subsequently 13.6--- Calcium  correction by albumin 14.2   Assessment & Plan:   Active Problems:   * No active hospital problems. *  1-Hypercalcemia:  -Patient has develops Hypercalcemia over last several days/.  -Calcium  Correction by albumin 14.3 -PTH: 46, PTH like peptide pending, Vitamin D: 61, TSH. 0.6 -Completed  Calcitonin BID for 2 days. > Monitor B-met twice Beth day.  -Maternal fetal specialist following as well.  -Ca down to 10. Corrected by albumin 10.9 -Will change IV fluids to 100 cc per hours.  Discussed with Dr. Kathyanne Parkers Endocrinologist, patient appears to have hyperparathyroidism. He recommend 24 hours urine calcium  and creatinine. Surgical consultation. He agrees to see patient in consult.     2-Hypokalemia;  Resolved. Continue with supplements.    3-Preeclampsia Per OB  Left Ventricle Hypertrophy on EKG; ECHO ordered.   Hypomagnesemia; Replete IV> time 1  Estimated body mass index is 49.65 kg/m as calculated from the following:   Height as of this encounter: 5' 11 (1.803 m).   Weight as of this encounter: 161.5 kg.  Subjective: Feeling better, weakness improved.   Objective: Vitals:   04/10/24 1606  04/10/24 1924 04/11/24 0011 04/11/24 0556  BP: 127/69 125/74 125/75 126/73  Pulse: 80 83 78 82  Resp: 18 18 18 18   Temp: 98.1 F (36.7 C) 98.1 F (36.7 C) 98 F (36.7 C) 98 F (36.7 C)  TempSrc: Oral Oral Oral   SpO2: 100% 100%    Weight:      Height:       No intake or output data in the 24 hours ending 04/11/24 0756 Filed Weights   03/24/24 1417  Weight: (!) 161.5 kg    Examination: General exam: NAD Respiratory system: CTA Cardiovascular system: S 1, S 2 RRR Gastrointestinal system: BS present, soft, nt Central nervous system: Alert, follows command Extremities: No edema   Data Reviewed: I have personally reviewed following labs and imaging studies  CBC: Recent Labs  Lab 04/07/24 0749 04/10/24 0803  WBC 7.0 6.8  HGB 11.0* 9.9*  HCT 35.4* 31.9*  MCV 79.9* 79.9*  PLT 192 187   Basic Metabolic Panel: Recent Labs  Lab 04/07/24 0612 04/07/24 0749 04/08/24 0545 04/09/24 0629 04/09/24 1735 04/10/24 0433 04/10/24 1714 04/11/24 0434  NA  --    < > 137 137 135 137 135 134*  K  --    < > 4.3 4.1 4.1 4.1 4.2 3.9  CL  --    < > 108 108 108 109 111 110  CO2  --    < > 21* 22 18* 20* 19* 19*  GLUCOSE  --    < > 97 96 113* 94 94 100*  BUN  --    < >  6 8 10 9 6  <5*  CREATININE  --    < > 0.64 0.65 0.60 0.62 0.60 0.55  CALCIUM   --    < > 13.2* 13.6* 12.7* 11.3* 10.7* 10.0  MG 1.8  --  1.7 1.9  --  1.6*  --  1.5*   < > = values in this interval not displayed.   GFR: Estimated Creatinine Clearance: 164.4 mL/min (by C-G formula based on SCr of 0.55 mg/dL). Liver Function Tests: Recent Labs  Lab 04/07/24 0749 04/08/24 0545 04/09/24 0629 04/10/24 0433 04/11/24 0434  AST 15 16 17 17 16   ALT 11 11 12 12 11   ALKPHOS 68 66 76 62 70  BILITOT 0.9 0.9 0.8 0.9 0.5  PROT 6.4* 6.4* 6.2* 6.3* 5.4*  ALBUMIN 2.8* 2.8* 2.7* 2.7* 2.4*   No results for input(s): LIPASE, AMYLASE in the last 168 hours. No results for input(s): AMMONIA in the last 168  hours. Coagulation Profile: No results for input(s): INR, PROTIME in the last 168 hours. Cardiac Enzymes: No results for input(s): CKTOTAL, CKMB, CKMBINDEX, TROPONINI in the last 168 hours. BNP (last 3 results) No results for input(s): PROBNP in the last 8760 hours. HbA1C: No results for input(s): HGBA1C in the last 72 hours. CBG: Recent Labs  Lab 04/05/24 0823  GLUCAP 128*   Lipid Profile: No results for input(s): CHOL, HDL, LDLCALC, TRIG, CHOLHDL, LDLDIRECT in the last 72 hours. Thyroid Function Tests: Recent Labs    04/09/24 1231  TSH 0.637   Anemia Panel: No results for input(s): VITAMINB12, FOLATE, FERRITIN, TIBC, IRON , RETICCTPCT in the last 72 hours. Sepsis Labs: No results for input(s): PROCALCITON, LATICACIDVEN in the last 168 hours.  No results found for this or any previous visit (from the past 240 hours).       Radiology Studies: ECHOCARDIOGRAM COMPLETE Result Date: 04/10/2024    ECHOCARDIOGRAM REPORT   Patient Name:   Beth [name] Date of Exam: 04/10/2024 Medical Rec #:  [id number]    Height:       71.0 in Accession #:    [id number]   Weight:       356.0 lb Date of Birth:  1986/07/24    BSA:          2.694 m Patient Age:    37 years     BP:           135/75 mmHg Patient Gender: F            HR:           76 bpm. Exam Location:  Inpatient Procedure: 2D Echo (Both Spectral and Color Flow Doppler were utilized during            procedure). Indications:     abnormal ecg  History:         Patient has no prior history of Echocardiogram examinations.                  Signs/Symptoms:prengnancy.  Sonographer:     Dione Franks RDCS Referring Phys:  [id number] Beth [name] Beth [name] Diagnosing Phys: Beth Bone MD IMPRESSIONS  1. Left ventricular ejection fraction, by estimation, is 55 to 60%. The left ventricle has normal function. The left ventricle has no regional wall motion abnormalities. There is mild concentric left ventricular  hypertrophy. Left ventricular diastolic parameters were normal.  2. Right ventricular systolic function is normal. The right ventricular size is normal. There is normal pulmonary artery systolic pressure.  3. Left atrial size  was mild to moderately dilated.  4. Right atrial size was mildly dilated.  5. The mitral valve is normal in structure. Mild mitral valve regurgitation.  6. The aortic valve is tricuspid. Aortic valve regurgitation is not visualized.  7. The inferior vena cava is dilated in size with <50% respiratory variability, suggesting right atrial pressure of 15 mmHg. FINDINGS  Left Ventricle: Left ventricular ejection fraction, by estimation, is 55 to 60%. The left ventricle has normal function. The left ventricle has no regional wall motion abnormalities. The left ventricular internal cavity size was normal in size. There is  mild concentric left ventricular hypertrophy. Left ventricular diastolic parameters were normal. Right Ventricle: The right ventricular size is normal. No increase in right ventricular wall thickness. Right ventricular systolic function is normal. There is normal pulmonary artery systolic pressure. The tricuspid regurgitant velocity is 2.29 m/s, and  with an assumed right atrial pressure of 8 mmHg, the estimated right ventricular systolic pressure is 29.0 mmHg. Left Atrium: Left atrial size was mild to moderately dilated. Right Atrium: Right atrial size was mildly dilated. Pericardium: There is no evidence of pericardial effusion. Mitral Valve: The mitral valve is normal in structure. Mild mitral valve regurgitation. Tricuspid Valve: The tricuspid valve is normal in structure. Tricuspid valve regurgitation is mild. Aortic Valve: The aortic valve is tricuspid. Aortic valve regurgitation is not visualized. Pulmonic Valve: The pulmonic valve was normal in structure. Pulmonic valve regurgitation is trivial. Aorta: The aortic root is normal in size and structure. Venous: The inferior  vena cava is dilated in size with less than 50% respiratory variability, suggesting right atrial pressure of 15 mmHg. IAS/Shunts: The atrial septum is grossly normal.  LEFT VENTRICLE PLAX 2D LVIDd:         5.20 cm      Diastology LVIDs:         3.40 cm      LV e' medial:    7.51 cm/s LV PW:         1.30 cm      LV E/e' medial:  10.0 LV IVS:        1.30 cm      LV e' lateral:   12.70 cm/s LVOT diam:     2.40 cm      LV E/e' lateral: 5.9 LV SV:         77 LV SV Index:   29 LVOT Area:     4.52 cm  LV Volumes (MOD) LV vol d, MOD A4C: 173.0 ml LV vol s, MOD A4C: 84.4 ml LV SV MOD A4C:     173.0 ml RIGHT VENTRICLE             IVC RV Basal diam:  3.00 cm     IVC diam: 2.20 cm RV S prime:     16.40 cm/s TAPSE (M-mode): 2.6 cm LEFT ATRIUM             Index        RIGHT ATRIUM           Index LA diam:        3.90 cm 1.45 cm/m   RA Area:     19.10 cm LA Vol (A2C):   78.2 ml 29.03 ml/m  RA Volume:   52.90 ml  19.64 ml/m LA Vol (A4C):   58.7 ml 21.79 ml/m LA Biplane Vol: 73.6 ml 27.32 ml/m  AORTIC VALVE             PULMONIC  VALVE LVOT Vmax:   103.00 cm/s PR End Diast Vel: 2.92 msec LVOT Vmean:  66.200 cm/s LVOT VTI:    0.171 m  AORTA Ao Root diam: 3.30 cm Ao Asc diam:  3.50 cm MITRAL VALVE               TRICUSPID VALVE MV Area (PHT): 3.77 cm    TR Peak grad:   21.0 mmHg MV Decel Time: 201 msec    TR Vmax:        229.00 cm/s MV E velocity: 75.10 cm/s MV Beth velocity: 51.40 cm/s  SHUNTS MV E/Beth ratio:  1.46        Systemic VTI:  0.17 m                            Systemic Diam: 2.40 cm Beth Bone MD Electronically signed by Beth Bone MD Signature Date/Time: 04/10/2024/9:46:28 AM    Final         Scheduled Meds:  docusate sodium   100 mg Oral Daily   famotidine   20 mg Oral Q12H   ferrous sulfate   325 mg Oral Q48H   labetalol   600 mg Oral Q8H   magnesium  oxide  400 mg Oral QID   potassium chloride   40 mEq Oral TID   prenatal multivitamin  1 tablet Oral Q1200   sodium chloride  flush  3 mL Intravenous Q12H    Continuous Infusions:  sodium chloride  125 mL/hr at 04/10/24 1719   magnesium  sulfate bolus IVPB       LOS: 18 days    Time spent: 35 minutes    Lorilynn Lehr Beth Yoland Scherr, MD [facility]   If 7PM-7AM, please contact night-coverage [email or web address]  04/11/2024, 7:56 AM

## 2024-04-12 DIAGNOSIS — E876 Hypokalemia: Secondary | ICD-10-CM | POA: Diagnosis not present

## 2024-04-12 LAB — BASIC METABOLIC PANEL WITH GFR
Anion gap: 8 (ref 5–15)
Anion gap: 6 (ref 5–15)
BUN: <5 mg/dL — ABNORMAL LOW (ref 6–20)
BUN: <5 mg/dL — ABNORMAL LOW (ref 6–20)
CO2: 19 mmol/L — ABNORMAL LOW (ref 22–32)
CO2: 19 mmol/L — ABNORMAL LOW (ref 22–32)
Calcium: 12.1 mg/dL — ABNORMAL HIGH (ref 8.9–10.3)
Calcium: 11.5 mg/dL — ABNORMAL HIGH (ref 8.9–10.3)
Chloride: 110 mmol/L (ref 98–111)
Chloride: 109 mmol/L (ref 98–111)
Creatinine, Ser: 0.6 mg/dL (ref 0.44–1.00)
Creatinine, Ser: 0.61 mg/dL (ref 0.44–1.00)
GFR, Estimated: >60 mL/min (ref >60)
GFR, Estimated: >60 mL/min (ref >60)
Glucose, Bld: 87 mg/dL (ref 70–99)
Glucose, Bld: 110 mg/dL — ABNORMAL HIGH (ref 70–99)
Potassium: 3.8 mmol/L (ref 3.5–5.1)
Potassium: 3.8 mmol/L (ref 3.5–5.1)
Sodium: 137 mmol/L (ref 135–145)
Sodium: 134 mmol/L — ABNORMAL LOW (ref 135–145)

## 2024-04-12 LAB — CREATININE, URINE, 24 HOUR
Collection Interval-UCRE24: 24 h
Creatinine, 24H Ur: 2120 mg/d — ABNORMAL HIGH (ref 600–1800)
Creatinine, Urine: 40 mg/dL
Urine Total Volume-UCRE24: 5300 mL

## 2024-04-12 LAB — HEPATIC FUNCTION PANEL
ALT: 13 U/L (ref 0–44)
AST: 20 U/L (ref 15–41)
Albumin: 2.7 g/dL — ABNORMAL LOW (ref 3.5–5.0)
Alkaline Phosphatase: 71 U/L (ref 38–126)
Bilirubin, Direct: 0.1 mg/dL (ref 0.0–0.2)
Indirect Bilirubin: 0.7 mg/dL (ref 0.3–0.9)
Total Bilirubin: 0.8 mg/dL (ref 0.0–1.2)
Total Protein: 6 g/dL — ABNORMAL LOW (ref 6.5–8.1)

## 2024-04-12 LAB — MAGNESIUM: Magnesium: 1.6 mg/dL — ABNORMAL LOW (ref 1.7–2.4)

## 2024-04-12 LAB — PTH, INTACT AND CALCIUM
Calcium, Total (PTH): 10.5 mg/dL — ABNORMAL HIGH (ref 8.7–10.2)
PTH: 39 pg/mL (ref 15–65)

## 2024-04-12 MED ORDER — DIPHENHYDRAMINE HCL 25 MG PO CAPS
25.0000 mg | ORAL_CAPSULE | ORAL | Status: DC | PRN
  Administered 2024-04-12 – 2024-04-13 (×2): 25 mg via ORAL
  Filled 2024-04-12 (×2): qty 1

## 2024-04-12 MED ORDER — MAGNESIUM SULFATE 2 GM/50ML IV SOLN
2.0000 g | Freq: Once | INTRAVENOUS | Status: AC
  Administered 2024-04-12: 2 g via INTRAVENOUS
  Filled 2024-04-12: qty 50

## 2024-04-12 MED ORDER — CALCITONIN (SALMON) 200 UNIT/ML IJ SOLN
400.0000 [IU] | Freq: Two times a day (BID) | INTRAMUSCULAR | Status: DC
  Administered 2024-04-12 (×2): 400 [IU] via INTRAMUSCULAR
  Filled 2024-04-12 (×6): qty 2

## 2024-04-12 NOTE — Consult Note (Signed)
 Beth [name] 1986/09/23  [id number].    Requesting MD: Dr. Catharine Clock Chief Complaint/Reason for Consult: hyperparathyroidism, [redacted]w[redacted]d pregnant  HPI:  This is a pleasant 38 yo black female with a history of gestational HTN, preeclampsia, hypothyroidism for 1 year that no longer required treatment when she was younger who was admitted in later May secondary to HTN and preeclampsia.  During this time, she was noted to have low magnesium  and potassium.  During this work up she has been found to have hypercalcemia as well consistent with parathyroid disease.  She underwent a thyroid US  that did not specifically reveal a parathyroid adenoma.  Because she is pregnant she is unable to have a Sestamibi test to further localize here parathyroids.  Endocrinology has seen her and many of the medications to help with hypercalcemia can also not be given during pregnancy.  We have been asked to see the patient for further surgical recommendations.   FH: mother has Graves disease  ROS: ROS: see HPI  Family History  Problem Relation Age of Onset   Dementia Mother    Hypertension Other    Cancer Other     Past Medical History:  Diagnosis Date   Anemia    Anxiety    Bulging lumbar disc    Headache    otc med prn - last one 4 months ago   History of blood transfusion    after 04/02/2016 svd at [facility]   Seizures (HCC)    Childhood   SVD (spontaneous vaginal delivery)    x 1    Past Surgical History:  Procedure Laterality Date   CESAREAN SECTION N/A 05/26/2017   Procedure: CESAREAN SECTION;  Surgeon: Artemisa Bile, MD;  Location: [facility];  Service: Obstetrics;  Laterality: N/A;   DILATION AND CURETTAGE OF UTERUS N/A 06/12/2016   Procedure: DILATATION AND CURETTAGE;  Surgeon: Artemisa Bile, MD;  Location: [facility];  Service: Gynecology;  Laterality: N/A;   TONSILLECTOMY     WISDOM TOOTH EXTRACTION      Social History:  reports that she has never smoked. She has never used smokeless  tobacco. She reports that she does not drink alcohol and does not use drugs.  Allergies:  Allergies  Allergen Reactions   Food Anaphylaxis and Other (See Comments)    Pt is allergic to kiwi.    Penicillins Rash and Other (See Comments)    Has patient had a PCN reaction causing immediate rash, facial/tongue/throat swelling, SOB or lightheadedness with hypotension: No Has patient had a PCN reaction causing severe rash involving mucus membranes or skin necrosis: No Has patient had a PCN reaction that required hospitalization: No Has patient had a PCN reaction occurring within the last 10 years: Yes If all of the above answers are NO, then may proceed with Cephalosporin use.    Strawberry (Diagnostic) Itching    Medications Prior to Admission  Medication Sig Dispense Refill   ibuprofen  (ADVIL ) 800 MG tablet Take 1 tablet (800 mg total) by mouth every 8 (eight) hours as needed. (Patient not taking: Reported on 03/24/2024) 60 tablet 1   Iron -FA-B Cmp-C-Biot-Probiotic (FUSION PLUS) CAPS Take 1 capsule by mouth daily.     NIFEdipine  (ADALAT  CC) 60 MG 24 hr tablet Take 1 tablet (60 mg total) by mouth in the morning and at bedtime. (Patient not taking: Reported on 03/24/2024) 60 tablet 1   Prenatal MV-Min-FA-Omega-3 (PRENATAL GUMMIES/DHA & FA) 0.4-32.5 MG CHEW Chew 2 each by mouth daily.  tirzepatide  (ZEPBOUND ) 7.5 MG/0.5ML Pen Inject 7.5 mg into the skin every 7 (seven) days. (Patient not taking: Reported on 03/24/2024) 2 mL 0     Physical Exam: Blood pressure 120/73, pulse 75, temperature 97.9 F (36.6 C), temperature source Oral, resp. rate 18, height 5' 11 (1.803 m), weight (!) 161.5 kg, SpO2 100%, unknown if currently breastfeeding. General: pleasant, WD, WN black female who is laying in bed in NAD HEENT: head is normocephalic, atraumatic.  Sclera are noninjected.  PERRL.  Ears and nose without any masses or lesions.  Mouth is pink and moist.  Thyroid with no obvious masses noted.    Heart: regular, rate, and rhythm.  Normal s1,s2. No obvious murmurs, gallops, or rubs noted.   Lungs: CTAB, no wheezes, rhonchi, or rales noted.  Respiratory effort nonlabored Abd: soft, NT, gravid Psych: A&Ox3 with an appropriate affect.   Results for orders placed or performed during the hospital encounter of 03/24/24 (from the past 48 hours)  Basic metabolic panel with GFR     Status: Abnormal   Collection Time: 04/10/24  5:14 PM  Result Value Ref Range   Sodium 135 135 - 145 mmol/L   Potassium 4.2 3.5 - 5.1 mmol/L   Chloride 111 98 - 111 mmol/L   CO2 19 (L) 22 - 32 mmol/L   Glucose, Bld 94 70 - 99 mg/dL    Comment: Glucose reference range applies only to samples taken after fasting for at least 8 hours.   BUN 6 6 - 20 mg/dL   Creatinine, Ser 0.27 0.44 - 1.00 mg/dL   Calcium  10.7 (H) 8.9 - 10.3 mg/dL   GFR, Estimated >25 >36 mL/min    Comment: (NOTE) Calculated using the CKD-EPI Creatinine Equation (2021)    Anion gap 5 5 - 15    Comment: Performed at [facility], [address], [place], [place] [address]  Type and screen [facility]     Status: None   Collection Time: 04/11/24  4:31 AM  Result Value Ref Range   ABO/RH(D) O POS    Antibody Screen NEG    Sample Expiration      04/14/2024,2359 Performed at [facility], [address] [place], [place] [address]   Magnesium      Status: Abnormal   Collection Time: 04/11/24  4:34 AM  Result Value Ref Range   Magnesium  1.5 (L) 1.7 - 2.4 mg/dL    Comment: Performed at [facility], [address] [place], [place] [address]  Comprehensive metabolic panel     Status: Abnormal   Collection Time: 04/11/24  4:34 AM  Result Value Ref Range   Sodium 134 (L) 135 - 145 mmol/L   Potassium 3.9 3.5 - 5.1 mmol/L   Chloride 110 98 - 111 mmol/L   CO2 19 (L) 22 - 32 mmol/L   Glucose, Bld 100 (H) 70 - 99 mg/dL    Comment: Glucose reference range applies only to samples taken after fasting for at  least 8 hours.   BUN <5 (L) 6 - 20 mg/dL   Creatinine, Ser 7.56 0.44 - 1.00 mg/dL   Calcium  10.0 8.9 - 10.3 mg/dL   Total Protein 5.4 (L) 6.5 - 8.1 g/dL   Albumin 2.4 (L) 3.5 - 5.0 g/dL   AST 16 15 - 41 U/L   ALT 11 0 - 44 U/L   Alkaline Phosphatase 70 38 - 126 U/L   Total Bilirubin 0.5 0.0 - 1.2  mg/dL   GFR, Estimated >29 >52 mL/min    Comment: (NOTE) Calculated using the CKD-EPI Creatinine Equation (2021)    Anion gap 5 5 - 15    Comment: Performed at [facility], [address], [place], [place] [address]  Magnesium      Status: Abnormal   Collection Time: 04/12/24  9:50 AM  Result Value Ref Range   Magnesium  1.6 (L) 1.7 - 2.4 mg/dL    Comment: Performed at [facility], [address], [place], [place] [address]  Basic metabolic panel with GFR     Status: Abnormal   Collection Time: 04/12/24  9:50 AM  Result Value Ref Range   Sodium 137 135 - 145 mmol/L   Potassium 3.8 3.5 - 5.1 mmol/L   Chloride 110 98 - 111 mmol/L   CO2 19 (L) 22 - 32 mmol/L   Glucose, Bld 87 70 - 99 mg/dL    Comment: Glucose reference range applies only to samples taken after fasting for at least 8 hours.   BUN <5 (L) 6 - 20 mg/dL   Creatinine, Ser 2.72 0.44 - 1.00 mg/dL   Calcium  12.1 (H) 8.9 - 10.3 mg/dL   GFR, Estimated >53 >66 mL/min    Comment: (NOTE) Calculated using the CKD-EPI Creatinine Equation (2021)    Anion gap 8 5 - 15    Comment: Performed at [facility], [address] [place], [place] [address]   US  SOFT TISSUE HEAD & NECK (NON-THYROID) Result Date: 04/12/2024 CLINICAL DATA:  Hyperparathyroidism and hypercalcemia during pregnancy. EXAM: ULTRASOUND OF HEAD/NECK SOFT TISSUES TECHNIQUE: Ultrasound examination of the head and neck soft tissues was performed in the area of clinical concern. COMPARISON:  None Available. FINDINGS: Demonstrate no evidence of abnormal parathyroid nodules or other abnormal soft tissue or lymphadenopathy in the neck. IMPRESSION: No evidence to  suggest obvious parathyroid adenoma by ultrasound. Consider nuclear medicine parathyroid scintigraphy. However, this likely cannot be performed during pregnancy given need to administer IV sestamibi. Electronically Signed   By: Erica Hau M.D.   On: 04/12/2024 08:34   US  THYROID Result Date: 04/12/2024 CLINICAL DATA:  Other. Hyperparathyroidism and history of hypothyroidism. EXAM: THYROID ULTRASOUND TECHNIQUE: Ultrasound examination of the thyroid gland and adjacent soft tissues was performed. COMPARISON:  None Available. FINDINGS: Parenchymal Echotexture: Normal Isthmus: 0.8 cm Right lobe: 4.9 x 2.2 x 1.8 cm Left lobe: 5.3 x 2.2 x 2.0 cm _________________________________________________________ Estimated total number of nodules >/= 1 cm: 0 Number of spongiform nodules >/=  2 cm not described below (TR1): 0 Number of mixed cystic and solid nodules >/= 1.5 cm not described below (TR2): 0 _________________________________________________________ Nodule # 1: Location: Left; Superior Maximum size: 0.6 cm; Other 2 dimensions: 0.4 x 0.5 cm Composition: solid/almost completely solid (2) Echogenicity: isoechoic (1) Shape: not taller-than-wide (0) Margins: smooth (0) Echogenic foci: none (0) ACR TI-RADS total points: 3. ACR TI-RADS risk category: TR3 (3 points). ACR TI-RADS recommendations: Given size (<1.4 cm) and appearance, this nodule does NOT meet TI-RADS criteria for biopsy or dedicated follow-up. _________________________________________________________ Nodule # 2: Location: Isthmus Maximum size: 0.6 cm; Other 2 dimensions: 0.4 x 0.6 cm Composition: solid/almost completely solid (2) Echogenicity: isoechoic (1) Shape: not taller-than-wide (0) Margins: smooth (0) Echogenic foci: none (0) ACR TI-RADS total points: 3. ACR TI-RADS risk category: TR3 (3 points). ACR TI-RADS recommendations: Given size (<1.4 cm) and appearance, this nodule does NOT meet TI-RADS criteria for biopsy or dedicated follow-up.  _________________________________________________________ No  enlarged or abnormal appearing lymph nodes are identified. No abnormal parathyroid nodules identified. IMPRESSION: 0.6 cm isthmus and left thyroid nodules do not meet criteria for biopsy or follow-up. The above is in keeping with the ACR TI-RADS recommendations - J Am Coll Radiol 2017;14:587-595. Electronically Signed   By: Erica Hau M.D.   On: 04/12/2024 08:33   US  MFM FETAL BPP WO NON STRESS Result Date: 04/11/2024 ----------------------------------------------------------------------  OBSTETRICS REPORT                        (Signed Final 04/11/2024 09:11 am) ---------------------------------------------------------------------- Patient Info  ID #:       [id number]                          D.O.B.:  07/14/1986 (37 yrs)(F)  Name:       Beth [name]                    Visit Date: 04/11/2024 07:59 am ---------------------------------------------------------------------- Performed By  Attending:        Sal Crass MD         Ref. Address:      [facility]                                                              OB[facility]                                                              [address]                                                              [address]  Performed By:     Drexel Gentles          Secondary Phy.:    [facility]                    RDMS                                                              Suites  Referred By:      Luz Sames             [facility]IN[facility] ---------------------------------------------------------------------- Orders  #  Description                           Code        Ordered By  1  US  MFM FETAL BPP WO NON               76819.01    [facility]     STRESS ----------------------------------------------------------------------  #  Order #                      Accession #                Episode #  1  [id number]                   [id number]                 [id number] ---------------------------------------------------------------------- Indications  Hypertension - Gestational                      O43.9  Advanced maternal age multigravida 71+,         O47.523  third trimester  Obesity complicating pregnancy, third           O99.213  trimester  Poor obstetric history: Prior fetal             O09.299  macrosomia, antepartum  Encounter for antenatal screening,              Z36.9  unspecified  [redacted] weeks gestation of pregnancy                 Z3A.32 ---------------------------------------------------------------------- Fetal Evaluation  Num Of Fetuses:          1  Fetal Heart Rate(bpm):   142  Cardiac Activity:        Observed  Presentation:            Cephalic  Placenta:                Posterior  P. Cord Insertion:       Previously seen  Amniotic Fluid  AFI FV:      Within normal limits  AFI Sum(cm)     %Tile       Largest Pocket(cm)  15.6            56          5.8  RUQ(cm)       RLQ(cm)       LUQ(cm)        LLQ(cm)  5.2           0.7           3.9            5.8 ---------------------------------------------------------------------- Biophysical Evaluation  Amniotic F.V:   Pocket => 2 cm             F. Tone:         Observed  F. Movement:    Observed  Score:           8/8  F. Breathing:   Observed ---------------------------------------------------------------------- OB History  Gravidity:    4         Term:   3  Living:       3 ---------------------------------------------------------------------- Gestational Age  Clinical EDD:  32w 5d                                        EDD:   06/01/24  Best:          32w 5d     Det. By:  Clinical EDD             EDD:   06/01/24 ---------------------------------------------------------------------- Anatomy  Cranium:               Appears normal         Stomach:                Appears normal, left                                                                         sided  Thoracic:              Appears normal         Kidneys:                Appear normal  RVOT:                  Appears normal         Bladder:                Appears normal  Diaphragm:             Appears normal ---------------------------------------------------------------------- Cervix Uterus Adnexa  Cervix  Not visualized (advanced GA >24wks)  Uterus  No abnormality visualized.  Right Ovary  Not visualized.  Left Ovary  Not visualized.  Cul De Sac  No free fluid seen.  Adnexa  No abnormality visualized ---------------------------------------------------------------------- Comments  This patient has been hospitalized due to preeclampsia.  A biophysical profile performed today was 8/8.  There was normal amniotic fluid noted with an  AFI of 15.6  cm.  The fetus is in the vertex presentation. ----------------------------------------------------------------------                   Sal Crass, MD Electronically Signed Final Report   04/11/2024 09:11 am ----------------------------------------------------------------------      Assessment/Plan Primary hyperparathyroidism The patient has been seen, examined, labs, and chart reviewed.  She appears to have primary hyperparathyroidism after endocrinology work up.  She is currently [redacted]w[redacted]d pregnant and here initially with HTN secondary to preeclampsia.  She is scheduled to be induced next Wednesday.  Ideally, if she can be controlled and managed, we would like to not proceed with surgical intervention until post delivery.  She will need localization of her PT glands.  Sestamibi is the best way, which could not be done until post partum.  The other options are CT 4D parathyroid protocol with contrast (lead to cover abdomen) or MRI with gadolinium, also would  need OB clearance for contrast pre-delivery.  Again, if patient managed and remains stable until induction, a more appropriate and better work up can be done post  partum.  This information was relayed to the primary service.  We would recommend calling us  back post partum and post localization to better plan possible surgical timing, etc.    FEN - regular VTE - SCDs ID - none  Preeclampsia HTN  I reviewed Consultant OB, endocrinology notes, hospitalist notes, last 24 h vitals and pain scores, last 48 h intake and output, last 24 h labs and trends, and last 24 h imaging results.  Leone Ralphs, [facility] 04/12/2024, 3:16 PM Please see Amion for pager number during day hours 7:00am-4:30pm or 7:00am -11:30am on weekends

## 2024-04-12 NOTE — Progress Notes (Signed)
 PROGRESS NOTE    Beth [name]  ZOX:[id number] DOB: March 29, 1986 DOA: 03/24/2024 PCP: Patient, No Pcp Per   Brief Narrative: Beth [name] is a 38 y.o. female  G4P3 at 63 weeks past medical history significant for anxiety,  previous history of hypothyroidism, family history of renal potassium wasting, patient was admitted 5/29 with preeclampsia by OB.  We  were consulted 6/01 for hypokalemia.  She was treated with magnesium  and potassium supplements.  Her potassium has now remained in a good range.  Blood pressure has been better controlled.  Plan is to proceed with delivery at 34 weeks.  Patient has developed hypercalcemia.  We were consulted 6/14  to assist with management.  Calcium  level has been ranging around 10.5----has been slowly increasing since 6/09 ---to 11----subsequently 13.6--- Calcium  correction by albumin 14.2   Assessment & Plan:   Active Problems:   * No active hospital problems. *  1-Hypercalcemia:  -Patient has develops Hypercalcemia over last several days/.  -Calcium  Correction by albumin 14.3 -PTH: 46, PTH like peptide pending, Vitamin D: 61, TSH. 0.6 -6/14-1/15: Completed  Calcitonin BID for 2 days.  -6/16: Ca down to 10. Corrected by albumin 10.9 -Discussed with Dr. Kathyanne Parkers Endocrinologist, patient appears to have hyperparathyroidism. He recommend 24 hours urine calcium  and creatinine. Surgical consultation. He agrees to see patient in consult.  -Appreciate Dr Kathyanne Parkers, patient calcium  has increase again to 12. --corrected by albumin 12.9. plan to start again Calcitonin for 2 days.  -I have contacted Surgery directly. Spoke with PA, they will see patient in consultation.     2-Hypokalemia;  Resolved. Continue with supplements.    3-Preeclampsia Per OB  Left Ventricle Hypertrophy on EKG; ECHO Ef 55% to 60 %. There is mild concentric left ventricular  hypertrophy.   Hypomagnesemia; replete IV   Estimated body mass index is 49.65 kg/m as calculated from the following:    Height as of this encounter: 5' 11 (1.803 m).   Weight as of this encounter: 161.5 kg.  Subjective: Feeling tired today. Could sleep well, had some itching overnight.   Objective: Vitals:   04/11/24 2055 04/11/24 2355 04/12/24 0347 04/12/24 0622  BP: 134/80 123/63 (!) 110/59 122/81  Pulse: 75 69 73 74  Resp: 16 16 17    Temp: 98.2 F (36.8 C) 98.1 F (36.7 C) 98.1 F (36.7 C)   TempSrc: Oral Oral Oral   SpO2: 98% 99% 99%   Weight:      Height:        Intake/Output Summary (Last 24 hours) at 04/12/2024 0915 Last data filed at 04/11/2024 1723 Gross per 24 hour  Intake 5298.91 ml  Output 500 ml  Net 4798.91 ml   Filed Weights   03/24/24 1417  Weight: (!) 161.5 kg    Examination: General exam: NAD Respiratory system: CTA Cardiovascular system:S 1, S2  RRR Gastrointestinal system: BS present, soft, nt Central nervous system: Alert, follows command Extremities: No edema   Data Reviewed: I have personally reviewed following labs and imaging studies  CBC: Recent Labs  Lab 04/07/24 0749 04/10/24 0803  WBC 7.0 6.8  HGB 11.0* 9.9*  HCT 35.4* 31.9*  MCV 79.9* 79.9*  PLT 192 187   Basic Metabolic Panel: Recent Labs  Lab 04/07/24 0612 04/07/24 0749 04/08/24 0545 04/09/24 0629 04/09/24 1735 04/10/24 0433 04/10/24 1714 04/11/24 0434  NA  --    < > 137 137 135 137 135 134*  K  --    < > 4.3 4.1 4.1 4.1  4.2 3.9  CL  --    < > 108 108 108 109 111 110  CO2  --    < > 21* 22 18* 20* 19* 19*  GLUCOSE  --    < > 97 96 113* 94 94 100*  BUN  --    < > 6 8 10 9 6  <5*  CREATININE  --    < > 0.64 0.65 0.60 0.62 0.60 0.55  CALCIUM   --    < > 13.2* 13.6* 12.7* 11.3* 10.7* 10.0  MG 1.8  --  1.7 1.9  --  1.6*  --  1.5*   < > = values in this interval not displayed.   GFR: Estimated Creatinine Clearance: 164.4 mL/min (by C-G formula based on SCr of 0.55 mg/dL). Liver Function Tests: Recent Labs  Lab 04/07/24 0749 04/08/24 0545 04/09/24 0629 04/10/24 0433  04/11/24 0434  AST 15 16 17 17 16   ALT 11 11 12 12 11   ALKPHOS 68 66 76 62 70  BILITOT 0.9 0.9 0.8 0.9 0.5  PROT 6.4* 6.4* 6.2* 6.3* 5.4*  ALBUMIN 2.8* 2.8* 2.7* 2.7* 2.4*   No results for input(s): LIPASE, AMYLASE in the last 168 hours. No results for input(s): AMMONIA in the last 168 hours. Coagulation Profile: No results for input(s): INR, PROTIME in the last 168 hours. Cardiac Enzymes: No results for input(s): CKTOTAL, CKMB, CKMBINDEX, TROPONINI in the last 168 hours. BNP (last 3 results) No results for input(s): PROBNP in the last 8760 hours. HbA1C: No results for input(s): HGBA1C in the last 72 hours. CBG: No results for input(s): GLUCAP in the last 168 hours.  Lipid Profile: No results for input(s): CHOL, HDL, LDLCALC, TRIG, CHOLHDL, LDLDIRECT in the last 72 hours. Thyroid Function Tests: Recent Labs    04/09/24 1231  TSH 0.637   Anemia Panel: No results for input(s): VITAMINB12, FOLATE, FERRITIN, TIBC, IRON , RETICCTPCT in the last 72 hours. Sepsis Labs: No results for input(s): PROCALCITON, LATICACIDVEN in the last 168 hours.  No results found for this or any previous visit (from the past 240 hours).       Radiology Studies: US  SOFT TISSUE HEAD & NECK (NON-THYROID) Result Date: 04/12/2024 CLINICAL DATA:  Hyperparathyroidism and hypercalcemia during pregnancy. EXAM: ULTRASOUND OF HEAD/NECK SOFT TISSUES TECHNIQUE: Ultrasound examination of the head and neck soft tissues was performed in the area of clinical concern. COMPARISON:  None Available. FINDINGS: Demonstrate no evidence of abnormal parathyroid nodules or other abnormal soft tissue or lymphadenopathy in the neck. IMPRESSION: No evidence to suggest obvious parathyroid adenoma by ultrasound. Consider nuclear medicine parathyroid scintigraphy. However, this likely cannot be performed during pregnancy given need to administer IV sestamibi. Electronically Signed    By: Erica Hau M.D.   On: 04/12/2024 08:34   US  THYROID Result Date: 04/12/2024 CLINICAL DATA:  Other. Hyperparathyroidism and history of hypothyroidism. EXAM: THYROID ULTRASOUND TECHNIQUE: Ultrasound examination of the thyroid gland and adjacent soft tissues was performed. COMPARISON:  None Available. FINDINGS: Parenchymal Echotexture: Normal Isthmus: 0.8 cm Right lobe: 4.9 x 2.2 x 1.8 cm Left lobe: 5.3 x 2.2 x 2.0 cm _________________________________________________________ Estimated total number of nodules >/= 1 cm: 0 Number of spongiform nodules >/=  2 cm not described below (TR1): 0 Number of mixed cystic and solid nodules >/= 1.5 cm not described below (TR2): 0 _________________________________________________________ Nodule # 1: Location: Left; Superior Maximum size: 0.6 cm; Other 2 dimensions: 0.4 x 0.5 cm Composition: solid/almost completely solid (2) Echogenicity: isoechoic (1)  Shape: not taller-than-wide (0) Margins: smooth (0) Echogenic foci: none (0) ACR TI-RADS total points: 3. ACR TI-RADS risk category: TR3 (3 points). ACR TI-RADS recommendations: Given size (<1.4 cm) and appearance, this nodule does NOT meet TI-RADS criteria for biopsy or dedicated follow-up. _________________________________________________________ Nodule # 2: Location: Isthmus Maximum size: 0.6 cm; Other 2 dimensions: 0.4 x 0.6 cm Composition: solid/almost completely solid (2) Echogenicity: isoechoic (1) Shape: not taller-than-wide (0) Margins: smooth (0) Echogenic foci: none (0) ACR TI-RADS total points: 3. ACR TI-RADS risk category: TR3 (3 points). ACR TI-RADS recommendations: Given size (<1.4 cm) and appearance, this nodule does NOT meet TI-RADS criteria for biopsy or dedicated follow-up. _________________________________________________________ No enlarged or abnormal appearing lymph nodes are identified. No abnormal parathyroid nodules identified. IMPRESSION: 0.6 cm isthmus and left thyroid nodules do not meet criteria  for biopsy or follow-up. The above is in keeping with the ACR TI-RADS recommendations - J Am Coll Radiol 2017;14:587-595. Electronically Signed   By: Erica Hau M.D.   On: 04/12/2024 08:33   US  MFM FETAL BPP WO NON STRESS Result Date: 04/11/2024 ----------------------------------------------------------------------  OBSTETRICS REPORT                        (Signed Final 04/11/2024 09:11 am) ---------------------------------------------------------------------- Patient Info  ID #:       [id number]                          D.O.B.:  09/13/1986 (37 yrs)(F)  Name:       [name]                    Visit Date: 04/11/2024 07:59 am ---------------------------------------------------------------------- Performed By  Attending:        Sal Crass MD         Ref. Address:      [facility]                                                              [facility]                                                              [address]                                                              [address]  Performed By:     Drexel Gentles          Secondary Phy.:  [facility]                    [facility]                                                              Suites  Referred By:      Luz Sames             [facility] ---------------------------------------------------------------------- Orders  #  Description                           Code        Ordered By  1  US  MFM FETAL BPP WO NON               76819.01    [facility] SHIVAJI     STRESS ----------------------------------------------------------------------  #  Order #                     Accession #                Episode #  1  [id number]                   [id number]                 [id number] ---------------------------------------------------------------------- Indications  Hypertension - Gestational                       O33.9  Advanced maternal age multigravida 59+,         O40.523  third trimester  Obesity complicating pregnancy, third           O99.213  trimester  Poor obstetric history: Prior fetal             O09.299  macrosomia, antepartum  Encounter for antenatal screening,              Z36.9  unspecified  [redacted] weeks gestation of pregnancy                 Z3A.32 ---------------------------------------------------------------------- Fetal Evaluation  Num Of Fetuses:          1  Fetal Heart Rate(bpm):   142  Cardiac Activity:        Observed  Presentation:            Cephalic  Placenta:                Posterior  P. Cord Insertion:       Previously seen  Amniotic Fluid  AFI FV:      Within normal limits  AFI Sum(cm)     %Tile       Largest Pocket(cm)  15.6            56          5.8  RUQ(cm)  RLQ(cm)       LUQ(cm)        LLQ(cm)  5.2           0.7           3.9            5.8 ---------------------------------------------------------------------- Biophysical Evaluation  Amniotic F.V:   Pocket => 2 cm             F. Tone:         Observed  F. Movement:    Observed                   Score:           8/8  F. Breathing:   Observed ---------------------------------------------------------------------- OB History  Gravidity:    4         Term:   3  Living:       3 ---------------------------------------------------------------------- Gestational Age  Clinical EDD:  32w 5d                                        EDD:   06/01/24  Best:          32w 5d     Det. By:  Clinical EDD             EDD:   06/01/24 ---------------------------------------------------------------------- Anatomy  Cranium:               Appears normal         Stomach:                Appears normal, left                                                                        sided  Thoracic:              Appears normal         Kidneys:                Appear normal  RVOT:                  Appears normal         Bladder:                Appears normal  Diaphragm:              Appears normal ---------------------------------------------------------------------- Cervix Uterus Adnexa  Cervix  Not visualized (advanced GA >24wks)  Uterus  No abnormality visualized.  Right Ovary  Not visualized.  Left Ovary  Not visualized.  Cul De Sac  No free fluid seen.  Adnexa  No abnormality visualized ---------------------------------------------------------------------- Comments  This patient has been hospitalized due to preeclampsia.  A biophysical profile performed today was 8/8.  There was normal amniotic fluid noted with an  AFI of 15.6  cm.  The fetus is in the vertex presentation. ----------------------------------------------------------------------                   Sal Crass, MD Electronically Signed Final Report   04/11/2024 09:11 am ----------------------------------------------------------------------  Scheduled Meds:  docusate sodium   100 mg Oral Daily   famotidine   20 mg Oral Q12H   ferrous sulfate   325 mg Oral Q48H   labetalol   600 mg Oral Q8H   magnesium  oxide  400 mg Oral QID   potassium chloride   40 mEq Oral TID   prenatal multivitamin  1 tablet Oral Q1200   sodium chloride  flush  3 mL Intravenous Q12H   Continuous Infusions:  sodium chloride  75 mL/hr at 04/11/24 2142     LOS: 19 days    Time spent: 35 minutes    Mckynlee Luse A Mariany Mackintosh, MD [facility]   If 7PM-7AM, please contact night-coverage [email or web address]  04/12/2024, 9:15 AM

## 2024-04-12 NOTE — Progress Notes (Signed)
 Subjective: Beth [name] has experienced itching without rash at her adhesive tape sites.  This is mild and not bothersome at the moment.  No previous history of allergy to tape or adhesive.  Otherwise she feels relatively well today.  Objective: Vital signs in last 24 hours: Temp:  [97.9 F (36.6 C)-98.2 F (36.8 C)] 97.9 F (36.6 C) (06/17 0939) Pulse Rate:  [69-78] 75 (06/17 0939) Resp:  [16-18] 18 (06/17 0939) BP: (110-134)/(59-81) 120/73 (06/17 0939) SpO2:  [98 %-100 %] 100 % (06/17 0939) Weight change:  Last BM Date : 04/09/24  Intake/Output from previous day: 06/16 0701 - 06/17 0700 In: 5298.9 [I.V.:5298.9] Out: 500 [Urine:500] Intake/Output this shift: Total I/O In: -  Out: 800 [Urine:800]  General: No apparent distress. Eyes: Extraocular eye movements intact, pupils equal and round. Neck: Supple, trachea midline. Thyroid: Upper limit of normal in size, mobile without fixation, no tenderness. Cardiovascular: Regular rhythm and rate, normal radial pulses. Respiratory: Normal respiratory effort, clear to auscultation. Neurologic: Cranial nerves normal as tested, no tremor. Musculoskeletal: Normal muscle tone. Skin: Appropriate warmth, no visible rash. Mental status: Alert, conversant, speech clear, thought logical, appropriate mood and affect, no hallucinations or delusions evident.  Lab Results: Recent Labs    04/10/24 0803  WBC 6.8  HGB 9.9*  HCT 31.9*  PLT 187   BMET Recent Labs    04/11/24 0434 04/12/24 0950  NA 134* 137  K 3.9 3.8  CL 110 110  CO2 19* 19*  GLUCOSE 100* 87  BUN <5* <5*  CREATININE 0.55 0.60  CALCIUM  10.0 12.1*    Studies/Results: US  SOFT TISSUE HEAD & NECK (NON-THYROID) Result Date: 04/12/2024 CLINICAL DATA:  Hyperparathyroidism and hypercalcemia during pregnancy. EXAM: ULTRASOUND OF HEAD/NECK SOFT TISSUES TECHNIQUE: Ultrasound examination of the head and neck soft tissues was performed in the area of clinical concern.  COMPARISON:  None Available. FINDINGS: Demonstrate no evidence of abnormal parathyroid nodules or other abnormal soft tissue or lymphadenopathy in the neck. IMPRESSION: No evidence to suggest obvious parathyroid adenoma by ultrasound. Consider nuclear medicine parathyroid scintigraphy. However, this likely cannot be performed during pregnancy given need to administer IV sestamibi. Electronically Signed   By: Erica Hau M.D.   On: 04/12/2024 08:34   US  THYROID Result Date: 04/12/2024 CLINICAL DATA:  Other. Hyperparathyroidism and history of hypothyroidism. EXAM: THYROID ULTRASOUND TECHNIQUE: Ultrasound examination of the thyroid gland and adjacent soft tissues was performed. COMPARISON:  None Available. FINDINGS: Parenchymal Echotexture: Normal Isthmus: 0.8 cm Right lobe: 4.9 x 2.2 x 1.8 cm Left lobe: 5.3 x 2.2 x 2.0 cm _________________________________________________________ Estimated total number of nodules >/= 1 cm: 0 Number of spongiform nodules >/=  2 cm not described below (TR1): 0 Number of mixed cystic and solid nodules >/= 1.5 cm not described below (TR2): 0 _________________________________________________________ Nodule # 1: Location: Left; Superior Maximum size: 0.6 cm; Other 2 dimensions: 0.4 x 0.5 cm Composition: solid/almost completely solid (2) Echogenicity: isoechoic (1) Shape: not taller-than-wide (0) Margins: smooth (0) Echogenic foci: none (0) ACR TI-RADS total points: 3. ACR TI-RADS risk category: TR3 (3 points). ACR TI-RADS recommendations: Given size (<1.4 cm) and appearance, this nodule does NOT meet TI-RADS criteria for biopsy or dedicated follow-up. _________________________________________________________ Nodule # 2: Location: Isthmus Maximum size: 0.6 cm; Other 2 dimensions: 0.4 x 0.6 cm Composition: solid/almost completely solid (2) Echogenicity: isoechoic (1) Shape: not taller-than-wide (0) Margins: smooth (0) Echogenic foci: none (0) ACR TI-RADS total points: 3. ACR TI-RADS  risk category: TR3 (3 points). ACR  TI-RADS recommendations: Given size (<1.4 cm) and appearance, this nodule does NOT meet TI-RADS criteria for biopsy or dedicated follow-up. _________________________________________________________ No enlarged or abnormal appearing lymph nodes are identified. No abnormal parathyroid nodules identified. IMPRESSION: 0.6 cm isthmus and left thyroid nodules do not meet criteria for biopsy or follow-up. The above is in keeping with the ACR TI-RADS recommendations - J Am Coll Radiol 2017;14:587-595. Electronically Signed   By: Erica Hau M.D.   On: 04/12/2024 08:33   US  MFM FETAL BPP WO NON STRESS Result Date: 04/11/2024 ----------------------------------------------------------------------  OBSTETRICS REPORT                        (Signed Final 04/11/2024 09:11 am) ---------------------------------------------------------------------- Patient Info  ID #:       [id number]                          D.O.B.:  [birth year] (37 yrs)(F)  Name:       Beth [name]                    Visit Date: 04/11/2024 07:59 am ---------------------------------------------------------------------- Performed By  Attending:        Sal Crass MD         Ref. Address:      [facility]                                                              [facility]                                                              [address]                                                              [address]  Performed By:     Drexel Gentles          Secondary Phy.:    [facility]                    RDMS                                                              Suites  Referred By:  MICHELLE E             [facility]                               [facility] ---------------------------------------------------------------------- Orders  #  Description                           Code        Ordered  By  1  US  MFM FETAL BPP WO NON               76819.01    [facility] SHIVAJI     STRESS ----------------------------------------------------------------------  #  Order #                     Accession #                Episode #  1  [id number]                   [id number]                 [id number] ---------------------------------------------------------------------- Indications  Hypertension - Gestational                      O49.9  Advanced maternal age multigravida 75+,         O27.523  third trimester  Obesity complicating pregnancy, third           O99.213  trimester  Poor obstetric history: Prior fetal             O09.299  macrosomia, antepartum  Encounter for antenatal screening,              Z36.9  unspecified  [redacted] weeks gestation of pregnancy                 Z3A.32 ---------------------------------------------------------------------- Fetal Evaluation  Num Of Fetuses:          1  Fetal Heart Rate(bpm):   142  Cardiac Activity:        Observed  Presentation:            Cephalic  Placenta:                Posterior  P. Cord Insertion:       Previously seen  Amniotic Fluid  AFI FV:      Within normal limits  AFI Sum(cm)     %Tile       Largest Pocket(cm)  15.6            56          5.8  RUQ(cm)       RLQ(cm)       LUQ(cm)        LLQ(cm)  5.2           0.7           3.9            5.8 ---------------------------------------------------------------------- Biophysical Evaluation  Amniotic F.V:   Pocket => 2 cm             F. Tone:         Observed  F. Movement:    Observed                   Score:           8/8  F. Breathing:   Observed ---------------------------------------------------------------------- OB History  Gravidity:    4         Term:   3  Living:       3 ---------------------------------------------------------------------- Gestational Age  Clinical EDD:  32w 5d                                        EDD:   06/01/24  Best:          32w 5d     Det. By:  Clinical EDD             EDD:   06/01/24  ---------------------------------------------------------------------- Anatomy  Cranium:               Appears normal         Stomach:                Appears normal, left                                                                        sided  Thoracic:              Appears normal         Kidneys:                Appear normal  RVOT:                  Appears normal         Bladder:                Appears normal  Diaphragm:             Appears normal ---------------------------------------------------------------------- Cervix Uterus Adnexa  Cervix  Not visualized (advanced GA >24wks)  Uterus  No abnormality visualized.  Right Ovary  Not visualized.  Left Ovary  Not visualized.  Cul De Sac  No free fluid seen.  Adnexa  No abnormality visualized ---------------------------------------------------------------------- Comments  This patient has been hospitalized due to preeclampsia.  A biophysical profile performed today was 8/8.  There was normal amniotic fluid noted with an  AFI of 15.6  cm.  The fetus is in the vertex presentation. ----------------------------------------------------------------------                   Sal Crass, MD Electronically Signed Final Report   04/11/2024 09:11 am ----------------------------------------------------------------------   Albumin-adjusted calcium  now 13.4 (total calcium  12.1 today and albumin 2.4 yesterday).  Medications: I have reviewed the patient's current medications.  Assessment/Plan: LOS: 19 days   1.  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 2.  Hypokalemia, resolved. 3.  Hypomagnesemia. 4.  Subcentimeter thyroid nodules.  Yesterday the patient I discussed how hypercalcemia can contribute to hypomagnesemia through magnesium  renal losses.  Furthermore, hypomagnesemia contributes to hypokalemia through renal potassium losses.  Unless the previous PTH level was misleading, the inappropriate parathyroid hormone level during hypercalcemia is  consistent  with primary hyperparathyroidism.  We discussed how this could be from a parathyroid adenoma (most likely), multi gland parathyroid hyperplasia, or rarely parathyroid carcinoma.  Considering the severity of her hypercalcemia today, it seems appropriate to increase the intravenous fluids back to 125 mL/h and to repeat calcitonin intramuscular twice a day for 2 days.  The patient I discussed how bisphosphonates, denosumab, and cinacalcet are typically not used during pregnancy.  We also discussed how there is limited experience using calcitonin during pregnancy.  She agreed though to proceed with calcitonin.  I spoke with Dr. Regalado about the findings and recommendations.  Dr. Landrum Pink plans to speak with a surgeon today about parathyroid surgery.    The 24-hour urine collection will be completed around 5:30 PM today.  A second parathyroid hormone level has been ordered and in progress.   The patient and I discussed her thyroid ultrasound findings.  No apparent parathyroid adenoma on this image for localization.  However, that does not exclude primary hyperparathyroidism and does not exclude a parathyroid neoplasm.    [42 minutes devoted today to pre-visit, face-to-face visit, and post-visit patient care]  Beth [name] 04/12/2024, 12:46 PM

## 2024-04-12 NOTE — Progress Notes (Signed)
 Patient ID: Beth [name], female   DOB: [birth year], 38 y.o.   MRN: [id number]   S: More muscle aching and fatigue today. Denies LOF, VB. Few CTX yesterday, none sustained. Denies HA/CP/SOB.   O:  Vitals:   04/11/24 2355 04/12/24 0347 04/12/24 0622 04/12/24 0939  BP: 123/63 (!) 110/59 122/81 120/73  Pulse: 69 73 74 75  Resp: 16 17  18   Temp: 98.1 F (36.7 C) 98.1 F (36.7 C)  97.9 F (36.6 C)  TempSrc: Oral Oral  Oral  SpO2: 99% 99%  100%  Weight:      Height:       Expand All Collapse All   A/P: 38 yo G4P3 @ 32.6 HD#21 admitted for PreE w/ SF   1) Preeclampsia: Current regimen: Labetalol  600 TID Patient has a history of severe headaches with Procardia  therefore labetalol  was the medication of choice for antihypertensive.  Labetalol  was titrated to maximum dose on 04/02/24, required slight decrease but is now stable at above regimen - Twice weekly BPP (8 out of 8 today, next 6/20), growth scan ordered for 6/20. - Continue inpatient management through delivery - Given severe preeclampsia will proceed with delivery at 34 weeks, IOL scheduled for 6/25 - Status post course of betamethasone  on 5/29 and 5/30.  Plan rescue course of betamethasone  at 33+5 (6/23) and 33+6 (6/24), or earlier if delivery is indicated sooner.   2) Hypokalemia: Potassium now in normal range on potassium 40 mEq TID. See hospitalist consult note from 6/6 for further details.   3) Hypomagnesia, minimally low today at 1.6 however gross hypomagnesemia resolved. Patient on magnesium  oxide 400 mg 4 times daily   4) Anemia of pregnancy: Status post IV iron  x 2.  First infusion on 5/30, second infusion 6/6 - Improved to 11.0 on 04/07/24, back down to 9.9 on 04/11/24. Could be dilutional due to IVF but may need another IV iron  pending CBC on 6/19   5) History of persistent headache: Has been having persistent headaches since early in the pregnancy.  Saw neurology in consultation on the day of admission for persistent  headache.  Now resolved.  Status post normal MRI of brain on hospital day #4. - Tylenol  and Fioricet  as needed   6) Hypercalcemia due to primary hyperparathyroidism: Internal medicine and endocrine following.  Ca 12.1 this AM, muscle weakness worsening.  IVF increased and calcitonin BID x 2 days ordered. 24 hr urine calcium  and creatinine pending. General surgery consulted today for possible surgical intervention, would like to delay until postpartum which I agree with. Patient will be induced next week and we will update general surgery once she is postpartum.   7) Routine prenatal care/ MOD: q72 hour T&S. Desires VBAC. History of 2 prior successful vaginal deliveries 1 of which was a VBAC.  Pelvis proven to 10 pounds.  Baby vertex on 6/16.

## 2024-04-13 LAB — CBC
HCT: 30.2 % — ABNORMAL LOW (ref 36.0–46.0)
Hemoglobin: 9.3 g/dL — ABNORMAL LOW (ref 12.0–15.0)
MCH: 24.8 pg — ABNORMAL LOW (ref 26.0–34.0)
MCHC: 30.8 g/dL (ref 30.0–36.0)
MCV: 80.5 fL (ref 80.0–100.0)
Platelets: 186 10*3/uL (ref 150–400)
RBC: 3.75 MIL/uL — ABNORMAL LOW (ref 3.87–5.11)
RDW: 22.5 % — ABNORMAL HIGH (ref 11.5–15.5)
WBC: 6.3 10*3/uL (ref 4.0–10.5)
nRBC: 0 % (ref 0.0–0.2)

## 2024-04-13 LAB — MAGNESIUM: Magnesium: 1.7 mg/dL (ref 1.7–2.4)

## 2024-04-13 LAB — BASIC METABOLIC PANEL WITH GFR
Anion gap: 7 (ref 5–15)
Anion gap: 6 (ref 5–15)
BUN: <5 mg/dL — ABNORMAL LOW (ref 6–20)
BUN: <5 mg/dL — ABNORMAL LOW (ref 6–20)
CO2: 19 mmol/L — ABNORMAL LOW (ref 22–32)
CO2: 19 mmol/L — ABNORMAL LOW (ref 22–32)
Calcium: 9.9 mg/dL (ref 8.9–10.3)
Calcium: 11.1 mg/dL — ABNORMAL HIGH (ref 8.9–10.3)
Chloride: 109 mmol/L (ref 98–111)
Chloride: 111 mmol/L (ref 98–111)
Creatinine, Ser: 0.61 mg/dL (ref 0.44–1.00)
Creatinine, Ser: 0.67 mg/dL (ref 0.44–1.00)
GFR, Estimated: >60 mL/min (ref >60)
GFR, Estimated: >60 mL/min (ref >60)
Glucose, Bld: 91 mg/dL (ref 70–99)
Glucose, Bld: 94 mg/dL (ref 70–99)
Potassium: 3.7 mmol/L (ref 3.5–5.1)
Potassium: 4.3 mmol/L (ref 3.5–5.1)
Sodium: 135 mmol/L (ref 135–145)
Sodium: 136 mmol/L (ref 135–145)

## 2024-04-13 MED ORDER — CALCITONIN (SALMON) 200 UNIT/ML IJ SOLN
400.0000 [IU] | Freq: Once | INTRAMUSCULAR | Status: AC
  Administered 2024-04-13: 400 [IU] via INTRAMUSCULAR
  Filled 2024-04-13: qty 2

## 2024-04-13 NOTE — Progress Notes (Addendum)
 Patient ID: Beth [name], female   DOB: 16-Jan-1986, 38 y.o.   MRN: [id number]   S:  Denies LOF, VB, ctx, dFM. Denies HA/CP/SOB. Muscle aches improved. Feels worried about breastfeeding postpartum with all of her health concerns.  O:  Vitals:   04/12/24 1551 04/12/24 2000 04/13/24 0032 04/13/24 0436  BP: 125/64 139/78 129/62 116/67  Pulse: 73 77 77 78  Resp: 18 16 16 16   Temp: 97.6 F (36.4 C) 98.1 F (36.7 C) 98.1 F (36.7 C) 98.1 F (36.7 C)  TempSrc: Oral Oral Oral Oral  SpO2: 100% 100% 99% 99%  Weight:      Height:       Physical Exam:  General: no acute distress Pulm: normal work of breathing on room air Card: well perfused MSK: normal ROM Neuro: no focal deficits, oriented x3 Psych: normal mood, normal thought   NST: 130, moderate variability, accels present, no decelerations, category 1 Toco: quiet     A/P: 38 yo G4P3 @ 33.0 HD#22 admitted for PreE w/ SF   1) Preeclampsia: Current regimen: Labetalol  600 TID Patient has a history of severe headaches with Procardia  therefore labetalol  was the medication of choice for antihypertensive.  Labetalol  was titrated to maximum dose on 04/02/24, required slight decrease but is now stable at above regimen - Twice weekly BPP (8 out of 8 on 6/16, next 6/20), growth scan ordered for 6/20. - Continue inpatient management through delivery - Given severe preeclampsia will proceed with delivery at 34 weeks, IOL scheduled for 6/25 - Status post course of betamethasone  on 5/29 and 5/30.  Plan rescue course of betamethasone  at 33+5 (6/23) and 33+6 (6/24), or earlier if delivery is indicated sooner.   2) Primary hyperparathyroidism with associated electrolyte abnormalities: Internal medicine and endocrine following. General surgery consulted, recommend specific imaging studies not generally completed during pregnancy, will likely defer surgical management until postpartum if patient is responsive to medical treatment. *OB team to reach out to  Dr Oralee Billow on surgery team in the postpartum period.      -hypercalcemia: Ca 9.9 (normal range) this AM, muscle weakness resolved.  IVF increased and calcitonin as needed per Medicine, hold for this morning in the setting of normal calcium . 24 hr urine creatinine elevated, 24hr urine calcium  pending.       -hypokalemia: likely linked with hyperparathyroidism per Endo. Potassium still currently in normal range on potassium 40 mEq TID.       -hypomagnesemia: also likely linked with hyperparathyroidism per Endo. Mag level normal today, on magnesium  oxide 400 mg 4 times daily   3) Anemia of pregnancy: Status post IV iron  x 2.  First infusion on 5/30, second infusion 6/6 - Improved to 11.0 on 04/07/24, back down to 9.9 on 04/11/24, now 9.3. Could be dilutional due to IVF but may need another IV iron  infusion pending CBC on 6/19   4) History of persistent headache: Has been having persistent headaches since early in the pregnancy.  Saw neurology in consultation on the day of admission for persistent headache.  Now resolved.  Status post normal MRI of brain on hospital day #4. Tylenol  and Fioricet  as needed   5) Routine prenatal care/ MOD: q72 hour T&S. Desires VBAC. History of 2 prior successful vaginal deliveries 1 of which was a VBAC.  Pelvis proven to 10 pounds.  Baby vertex on 6/16.   *Addendum to add name of potential surgeon.

## 2024-04-13 NOTE — Progress Notes (Signed)
 Calcium  noted to be increasing again on evening labs. Now 11.1 from 9.9. I contacted the Hospitalist team for review and consideration of whether management is needed at this time.

## 2024-04-13 NOTE — Progress Notes (Signed)
 Subjective: Beth [name] feels better today.  Her muscle aches and fatigue resolved.  She continues to drink water.  She tries to stand or walk when possible for weight bearing exercise.     Objective: Vital signs in last 24 hours: Temp:  [97.6 F (36.4 C)-98.4 F (36.9 C)] 98.4 F (36.9 C) (06/18 1146) Pulse Rate:  [73-79] 78 (06/18 1146) Resp:  [16-18] 17 (06/18 1146) BP: (112-139)/(62-78) 132/64 (06/18 1146) SpO2:  [99 %-100 %] 100 % (06/18 1146) Weight change:  Last BM Date : 04/09/24  Intake/Output from previous day: 06/17 0701 - 06/18 0700 In: -  Out: 800 [Urine:800] Intake/Output this shift: No intake/output data recorded.  General: No apparent distress. Thyroid: Upper limit of normal in size, mobile without fixation, no tenderness. Cardiovascular: Regular rhythm and rate, no murmur, normal radial pulses. Respiratory: Normal respiratory effort, clear to auscultation. Neurologic: Cranial nerves normal as tested,  no tremor. Musculoskeletal: Normal muscle tone. Mental status: Alert, conversant, speech clear, thought logical, appropriate mood and affect, no hallucinations or delusions evident.  Lab Results: Recent Labs    04/13/24 0617  WBC 6.3  HGB 9.3*  HCT 30.2*  PLT 186   BMET Recent Labs    04/12/24 1652 04/13/24 0617  NA 134* 135  K 3.8 3.7  CL 109 109  CO2 19* 19*  GLUCOSE 110* 91  BUN <5* <5*  CREATININE 0.61 0.61  CALCIUM  11.5* 9.9    Studies/Results: US  SOFT TISSUE HEAD & NECK (NON-THYROID) Result Date: 04/12/2024 CLINICAL DATA:  Hyperparathyroidism and hypercalcemia during pregnancy. EXAM: ULTRASOUND OF HEAD/NECK SOFT TISSUES TECHNIQUE: Ultrasound examination of the head and neck soft tissues was performed in the area of clinical concern. COMPARISON:  None Available. FINDINGS: Demonstrate no evidence of abnormal parathyroid nodules or other abnormal soft tissue or lymphadenopathy in the neck. IMPRESSION: No evidence to suggest obvious  parathyroid adenoma by ultrasound. Consider nuclear medicine parathyroid scintigraphy. However, this likely cannot be performed during pregnancy given need to administer IV sestamibi. Electronically Signed   By: Erica Hau M.D.   On: 04/12/2024 08:34   US  THYROID Result Date: 04/12/2024 CLINICAL DATA:  Other. Hyperparathyroidism and history of hypothyroidism. EXAM: THYROID ULTRASOUND TECHNIQUE: Ultrasound examination of the thyroid gland and adjacent soft tissues was performed. COMPARISON:  None Available. FINDINGS: Parenchymal Echotexture: Normal Isthmus: 0.8 cm Right lobe: 4.9 x 2.2 x 1.8 cm Left lobe: 5.3 x 2.2 x 2.0 cm _________________________________________________________ Estimated total number of nodules >/= 1 cm: 0 Number of spongiform nodules >/=  2 cm not described below (TR1): 0 Number of mixed cystic and solid nodules >/= 1.5 cm not described below (TR2): 0 _________________________________________________________ Nodule # 1: Location: Left; Superior Maximum size: 0.6 cm; Other 2 dimensions: 0.4 x 0.5 cm Composition: solid/almost completely solid (2) Echogenicity: isoechoic (1) Shape: not taller-than-wide (0) Margins: smooth (0) Echogenic foci: none (0) ACR TI-RADS total points: 3. ACR TI-RADS risk category: TR3 (3 points). ACR TI-RADS recommendations: Given size (<1.4 cm) and appearance, this nodule does NOT meet TI-RADS criteria for biopsy or dedicated follow-up. _________________________________________________________ Nodule # 2: Location: Isthmus Maximum size: 0.6 cm; Other 2 dimensions: 0.4 x 0.6 cm Composition: solid/almost completely solid (2) Echogenicity: isoechoic (1) Shape: not taller-than-wide (0) Margins: smooth (0) Echogenic foci: none (0) ACR TI-RADS total points: 3. ACR TI-RADS risk category: TR3 (3 points). ACR TI-RADS recommendations: Given size (<1.4 cm) and appearance, this nodule does NOT meet TI-RADS criteria for biopsy or dedicated follow-up.  _________________________________________________________ No enlarged or abnormal  appearing lymph nodes are identified. No abnormal parathyroid nodules identified. IMPRESSION: 0.6 cm isthmus and left thyroid nodules do not meet criteria for biopsy or follow-up. The above is in keeping with the ACR TI-RADS recommendations - J Am Coll Radiol 2017;14:587-595. Electronically Signed   By: Erica Hau M.D.   On: 04/12/2024 08:33    Medications: I have reviewed the patient's current medications.  Assessment/Plan: Primary hyperparathyroidism with hypercalcemia History of kidney stones 3.   Thyroid nodules  During our visit today, we again reviewed how her PTH levels have been inappropriate during hypocalcemia.  Neither of the two PTH measurements were suppressed.  We discussed how her hypercalcemia has greatly improved.  It seems appropriate to stop the calcitonin for now.  She agreed to drink plenty of water and continue with weight bearing exercise (at least as allowed by her obstetrician).  The patient anticipates parathyroid surgery shortly after this pregnancy.  Yesterday and today we discussed how a parathyroid gland can be embedded in the thyroid gland or found away from the usual location (such as in the thymus).    Education provided including patient handouts from the [employer] on thyroid nodules and the [employer] on primary hyperparathyroidism.    LOS: 20 days   Beth [name] 04/13/2024, 12:43 PM

## 2024-04-13 NOTE — Progress Notes (Signed)
 PROGRESS NOTE    Beth [name]  ZOX:[id number] DOB: Mar 19, 1986 DOA: 03/24/2024 PCP: Patient, No Pcp Per   Brief Narrative: Beth [name] is a 38 y.o. female  G4P3 at 27 weeks past medical history significant for anxiety,  previous history of hypothyroidism, family history of renal potassium wasting, patient was admitted 5/29 with preeclampsia by OB.  We  were consulted 6/01 for hypokalemia.  She was treated with magnesium  and potassium supplements.  Her potassium has now remained in a good range.  Blood pressure has been better controlled.  Plan is to proceed with delivery at 34 weeks.  Patient has developed hypercalcemia.  We were consulted 6/14  to assist with management.  Calcium  level has been ranging around 10.5----has been slowly increasing since 6/09 ---to 11----subsequently 13.6--- Calcium  correction by albumin 14.2   Assessment & Plan:  Hypercalcemia PTH: 46, PTH like peptide pending, Vitamin D: 61, TSH. 0.6 6/14-6/15: Completed  Calcitonin BID for 2 days.  6/16: Ca down to 10. Corrected by albumin 10.9 Discussed with Dr. Kathyanne Parkers Endocrinologist, patient appears to have hyperparathyroidism. He recommend 24 hours urine calcium  and creatinine Received another day of calcitonin on 6/17, currently discontinued due to calcium  being WNL General Surgery consulted, plan for possible parathyroid surgery postdelivery Continue to monitor calcium  levels   Preeclampsia Per OB  Left Ventricle Hypertrophy on EKG; ECHO Ef 55% to 60 % There is mild concentric left ventricular hypertrophy.   Hypomagnesemia Replete IV as needed, continue p.o. magnesium  oxide  Estimated body mass index is 49.65 kg/m as calculated from the following:   Height as of this encounter: 5' 11 (1.803 m).   Weight as of this encounter: 161.5 kg.  Subjective: Denies any new complaints this a.m.  Objective: Vitals:   04/13/24 0436 04/13/24 0843 04/13/24 1146 04/13/24 1614  BP: 116/67 112/71 132/64 122/64  Pulse: 78  79 78 77  Resp: 16 17 17 16   Temp: 98.1 F (36.7 C) 98.1 F (36.7 C) 98.4 F (36.9 C) 97.6 F (36.4 C)  TempSrc: Oral Oral Oral Oral  SpO2: 99% 100% 100% 100%  Weight:      Height:       No intake or output data in the 24 hours ending 04/13/24 1907  Filed Weights   03/24/24 1417  Weight: (!) 161.5 kg    Examination: General: NAD  Cardiovascular: S1, S2 present Respiratory: CTAB Musculoskeletal: Trace bilateral pedal edema noted Skin: Normal Psychiatry: Normal mood    Data Reviewed: I have personally reviewed following labs and imaging studies  CBC: Recent Labs  Lab 04/07/24 0749 04/10/24 0803 04/13/24 0617  WBC 7.0 6.8 6.3  HGB 11.0* 9.9* 9.3*  HCT 35.4* 31.9* 30.2*  MCV 79.9* 79.9* 80.5  PLT 192 187 186   Basic Metabolic Panel: Recent Labs  Lab 04/09/24 0629 04/09/24 1735 04/10/24 0433 04/10/24 1714 04/11/24 0434 04/11/24 0853 04/12/24 0950 04/12/24 1652 04/13/24 0617 04/13/24 1651  NA 137   < > 137   < > 134*  --  137 134* 135 136  K 4.1   < > 4.1   < > 3.9  --  3.8 3.8 3.7 4.3  CL 108   < > 109   < > 110  --  110 109 109 111  CO2 22   < > 20*   < > 19*  --  19* 19* 19* 19*  GLUCOSE 96   < > 94   < > 100*  --  87 110* 91  94  BUN 8   < > 9   < > <5*  --  <5* <5* <5* <5*  CREATININE 0.65   < > 0.62   < > 0.55  --  0.60 0.61 0.61 0.67  CALCIUM  13.6*   < > 11.3*   < > 10.0 10.5* 12.1* 11.5* 9.9 11.1*  MG 1.9  --  1.6*  --  1.5*  --  1.6*  --  1.7  --    < > = values in this interval not displayed.   GFR: Estimated Creatinine Clearance: 164.4 mL/min (by C-G formula based on SCr of 0.67 mg/dL). Liver Function Tests: Recent Labs  Lab 04/08/24 0545 04/09/24 0629 04/10/24 0433 04/11/24 0434 04/12/24 1652  AST 16 17 17 16 20   ALT 11 12 12 11 13   ALKPHOS 66 76 62 70 71  BILITOT 0.9 0.8 0.9 0.5 0.8  PROT 6.4* 6.2* 6.3* 5.4* 6.0*  ALBUMIN 2.8* 2.7* 2.7* 2.4* 2.7*   No results for input(s): LIPASE, AMYLASE in the last 168 hours. No results  for input(s): AMMONIA in the last 168 hours. Coagulation Profile: No results for input(s): INR, PROTIME in the last 168 hours. Cardiac Enzymes: No results for input(s): CKTOTAL, CKMB, CKMBINDEX, TROPONINI in the last 168 hours. BNP (last 3 results) No results for input(s): PROBNP in the last 8760 hours. HbA1C: No results for input(s): HGBA1C in the last 72 hours. CBG: No results for input(s): GLUCAP in the last 168 hours.  Lipid Profile: No results for input(s): CHOL, HDL, LDLCALC, TRIG, CHOLHDL, LDLDIRECT in the last 72 hours. Thyroid Function Tests: No results for input(s): TSH, T4TOTAL, FREET4, T3FREE, THYROIDAB in the last 72 hours.  Anemia Panel: No results for input(s): VITAMINB12, FOLATE, FERRITIN, TIBC, IRON , RETICCTPCT in the last 72 hours. Sepsis Labs: No results for input(s): PROCALCITON, LATICACIDVEN in the last 168 hours.  No results found for this or any previous visit (from the past 240 hours).       Radiology Studies: US  SOFT TISSUE HEAD & NECK (NON-THYROID) Result Date: 04/12/2024 CLINICAL DATA:  Hyperparathyroidism and hypercalcemia during pregnancy. EXAM: ULTRASOUND OF HEAD/NECK SOFT TISSUES TECHNIQUE: Ultrasound examination of the head and neck soft tissues was performed in the area of clinical concern. COMPARISON:  None Available. FINDINGS: Demonstrate no evidence of abnormal parathyroid nodules or other abnormal soft tissue or lymphadenopathy in the neck. IMPRESSION: No evidence to suggest obvious parathyroid adenoma by ultrasound. Consider nuclear medicine parathyroid scintigraphy. However, this likely cannot be performed during pregnancy given need to administer IV sestamibi. Electronically Signed   By: Erica Hau M.D.   On: 04/12/2024 08:34   US  THYROID Result Date: 04/12/2024 CLINICAL DATA:  Other. Hyperparathyroidism and history of hypothyroidism. EXAM: THYROID ULTRASOUND TECHNIQUE: Ultrasound  examination of the thyroid gland and adjacent soft tissues was performed. COMPARISON:  None Available. FINDINGS: Parenchymal Echotexture: Normal Isthmus: 0.8 cm Right lobe: 4.9 x 2.2 x 1.8 cm Left lobe: 5.3 x 2.2 x 2.0 cm _________________________________________________________ Estimated total number of nodules >/= 1 cm: 0 Number of spongiform nodules >/=  2 cm not described below (TR1): 0 Number of mixed cystic and solid nodules >/= 1.5 cm not described below (TR2): 0 _________________________________________________________ Nodule # 1: Location: Left; Superior Maximum size: 0.6 cm; Other 2 dimensions: 0.4 x 0.5 cm Composition: solid/almost completely solid (2) Echogenicity: isoechoic (1) Shape: not taller-than-wide (0) Margins: smooth (0) Echogenic foci: none (0) ACR TI-RADS total points: 3. ACR TI-RADS risk category: TR3 (3 points). ACR  TI-RADS recommendations: Given size (<1.4 cm) and appearance, this nodule does NOT meet TI-RADS criteria for biopsy or dedicated follow-up. _________________________________________________________ Nodule # 2: Location: Isthmus Maximum size: 0.6 cm; Other 2 dimensions: 0.4 x 0.6 cm Composition: solid/almost completely solid (2) Echogenicity: isoechoic (1) Shape: not taller-than-wide (0) Margins: smooth (0) Echogenic foci: none (0) ACR TI-RADS total points: 3. ACR TI-RADS risk category: TR3 (3 points). ACR TI-RADS recommendations: Given size (<1.4 cm) and appearance, this nodule does NOT meet TI-RADS criteria for biopsy or dedicated follow-up. _________________________________________________________ No enlarged or abnormal appearing lymph nodes are identified. No abnormal parathyroid nodules identified. IMPRESSION: 0.6 cm isthmus and left thyroid nodules do not meet criteria for biopsy or follow-up. The above is in keeping with the ACR TI-RADS recommendations - J Am Coll Radiol 2017;14:587-595. Electronically Signed   By: Erica Hau M.D.   On: 04/12/2024 08:33         Scheduled Meds:  docusate sodium   100 mg Oral Daily   famotidine   20 mg Oral Q12H   ferrous sulfate   325 mg Oral Q48H   labetalol   600 mg Oral Q8H   magnesium  oxide  400 mg Oral QID   potassium chloride   40 mEq Oral TID   prenatal multivitamin  1 tablet Oral Q1200   sodium chloride  flush  3 mL Intravenous Q12H   Continuous Infusions:  sodium chloride  125 mL/hr at 04/13/24 1722     LOS: 20 days     Veronica Gordon, MD [facility]   If 7PM-7AM, please contact night-coverage [email or web address]  04/13/2024, 7:07 PM

## 2024-04-13 NOTE — Progress Notes (Addendum)
 Interim progress note:  Was alerted by OB MD that calcium  is rising again on BMP.  Chart review indicates patient is being followed by endocrinology and internal medicine for hyperparathyroidism in third trimester of pregnancy.  She has been on scheduled calcitonin 400 units twice daily which was discontinued today in light of normalizing calcium .  Calcium  appears to be rising again now, no albumin for correction but presumed to be low based on prior labs. For now we will resume at prior dosing, 400 units ordered to be given once tonight. Patient is already on appropriate IVF, will continue. CMP ordered for a.m.  Will defer to endocrinology and internal medicine day team for further prescribing at that time.

## 2024-04-14 LAB — COMPREHENSIVE METABOLIC PANEL WITH GFR
ALT: 12 U/L (ref 0–44)
AST: 16 U/L (ref 15–41)
Albumin: 2.3 g/dL — ABNORMAL LOW (ref 3.5–5.0)
Alkaline Phosphatase: 63 U/L (ref 38–126)
Anion gap: 7 (ref 5–15)
BUN: <5 mg/dL — ABNORMAL LOW (ref 6–20)
CO2: 21 mmol/L — ABNORMAL LOW (ref 22–32)
Calcium: 10.7 mg/dL — ABNORMAL HIGH (ref 8.9–10.3)
Chloride: 110 mmol/L (ref 98–111)
Creatinine, Ser: 0.51 mg/dL (ref 0.44–1.00)
GFR, Estimated: >60 mL/min (ref >60)
Glucose, Bld: 98 mg/dL (ref 70–99)
Potassium: 3.9 mmol/L (ref 3.5–5.1)
Sodium: 138 mmol/L (ref 135–145)
Total Bilirubin: 0.6 mg/dL (ref 0.0–1.2)
Total Protein: 5.3 g/dL — ABNORMAL LOW (ref 6.5–8.1)

## 2024-04-14 LAB — CALCIUM, URINE, 24 HOUR
Calcium, 24 hour urine: 1516 mg/(24.h) — ABNORMAL HIGH (ref 0–320)
Calcium, Ur: 28.6 mg/dL
Total Volume: 5300

## 2024-04-14 LAB — MAGNESIUM: Magnesium: 1.6 mg/dL — ABNORMAL LOW (ref 1.7–2.4)

## 2024-04-14 LAB — TYPE AND SCREEN
ABO/RH(D): O POS
Antibody Screen: NEGATIVE

## 2024-04-14 NOTE — Progress Notes (Signed)
 Subjective: Beth [name] feels well today.  She continues to drink water, around 160 fluid ounces per day.  She walks around the unit each time she fills her 40 ounce water bottle.  Otherwise, not much weightbearing activity.     Objective: Vital signs in last 24 hours: Temp:  [97.6 F (36.4 C)-98.4 F (36.9 C)] 98.1 F (36.7 C) (06/19 0846) Pulse Rate:  [68-86] 86 (06/19 0846) Resp:  [16-18] 18 (06/19 0846) BP: (108-127)/(57-74) 108/57 (06/19 0846) SpO2:  [100 %] 100 % (06/19 0846) Weight change:  Last BM Date : 04/09/24  Intake/Output from previous day: No intake/output data recorded. Intake/Output this shift: No intake/output data recorded.  General: No apparent distress. Cardiovascular: Regular rhythm and rate, no murmur, normal radial pulses. Respiratory: Normal respiratory effort, clear to auscultation. Neurologic: Cranial nerves normal as tested,  no tremor. Musculoskeletal: Normal muscle tone. Mental status: Alert, conversant, speech clear, thought logical, appropriate mood and affect, no hallucinations or delusions evident.  Lab Results: Recent Labs    04/13/24 0617  WBC 6.3  HGB 9.3*  HCT 30.2*  PLT 186   BMET Recent Labs    04/13/24 1651 04/14/24 0558  NA 136 138  K 4.3 3.9  CL 111 110  CO2 19* 21*  GLUCOSE 94 98  BUN <5* <5*  CREATININE 0.67 0.51  CALCIUM  11.1* 10.7*     Medications: I have reviewed the patient's current medications.  Assessment/Plan: Primary hyperparathyroidism with hypercalcemia History of kidney stones  During our visit today, we discussed how her calcium  level improved after doses of calcitonin.   We can order additional calcitonin doses if necessary.  She agreed to drink plenty of water and increase weight bearing exercise (at least as allowed by her obstetrician).  We discussed options such as standing, walking, lifting with right arm (since left arm has intravenous catheter in place), etc.  Beth [name] 04/14/2024, 12:34  PM

## 2024-04-14 NOTE — Progress Notes (Signed)
 PROGRESS NOTE    [name]  HQI:[id number] DOB: 12/08/1985 DOA: 03/24/2024 PCP: Patient, No Pcp Per   Brief Narrative: Beth [name] is a 38 y.o. female  G4P3 at 40 weeks past medical history significant for anxiety,  previous history of hypothyroidism, family history of renal potassium wasting, patient was admitted 5/29 with preeclampsia by OB.  We  were consulted 6/01 for hypokalemia.  She was treated with magnesium  and potassium supplements.  Her potassium has now remained in a good range.  Blood pressure has been better controlled.  Plan is to proceed with delivery at 34 weeks.  Patient has developed hypercalcemia.  We were consulted 6/14  to assist with management.  Calcium  level has been ranging around 10.5----has been slowly increasing since 6/09 ---to 11----subsequently 13.6--- Calcium  correction by albumin 14.2     Assessment & Plan:  Hypercalcemia PTH: 46, PTH like peptide pending, Vitamin D: 61, TSH. 0.6 6/14-6/15: Completed  Calcitonin BID for 2 days.  6/16: Ca down to 10. Corrected by albumin 10.9 Discussed with Dr. Kathyanne Parkers Endocrinologist, patient appears to have hyperparathyroidism. He recommend 24 hours urine calcium  and creatinine Received another day of calcitonin on 6/17, giving it prn General Surgery consulted, plan for possible parathyroid surgery postdelivery Continue to monitor calcium  levels   Preeclampsia Per OB  Left Ventricle Hypertrophy on EKG; ECHO Ef 55% to 60 % There is mild concentric left ventricular hypertrophy.   Hypomagnesemia Replete IV as needed, continue p.o. magnesium  oxide  Estimated body mass index is 49.65 kg/m as calculated from the following:   Height as of this encounter: 5' 11 (1.803 m).   Weight as of this encounter: 161.5 kg.  Subjective: Denies any new complaints this a.m.  Objective: Vitals:   04/14/24 0502 04/14/24 0846 04/14/24 1439 04/14/24 1513  BP: 119/74 (!) 108/57 129/85 131/72  Pulse: 72 86 68 66  Resp: 18 18 18     Temp: 98.2 F (36.8 C) 98.1 F (36.7 C) 98 F (36.7 C) 98 F (36.7 C)  TempSrc: Oral Oral Oral   SpO2: 100% 100% 100% 100%  Weight:      Height:       No intake or output data in the 24 hours ending 04/14/24 1936  Filed Weights   03/24/24 1417  Weight: (!) 161.5 kg    Examination: General: NAD  Cardiovascular: S1, S2 present Respiratory: CTAB Musculoskeletal: Trace bilateral pedal edema noted Skin: Normal Psychiatry: Normal mood    Data Reviewed: I have personally reviewed following labs and imaging studies  CBC: Recent Labs  Lab 04/10/24 0803 04/13/24 0617  WBC 6.8 6.3  HGB 9.9* 9.3*  HCT 31.9* 30.2*  MCV 79.9* 80.5  PLT 187 186   Basic Metabolic Panel: Recent Labs  Lab 04/10/24 0433 04/10/24 1714 04/11/24 0434 04/11/24 0853 04/12/24 0950 04/12/24 1652 04/13/24 0617 04/13/24 1651 04/14/24 0558  NA 137   < > 134*  --  137 134* 135 136 138  K 4.1   < > 3.9  --  3.8 3.8 3.7 4.3 3.9  CL 109   < > 110  --  110 109 109 111 110  CO2 20*   < > 19*  --  19* 19* 19* 19* 21*  GLUCOSE 94   < > 100*  --  87 110* 91 94 98  BUN 9   < > <5*  --  <5* <5* <5* <5* <5*  CREATININE 0.62   < > 0.55  --  0.60 0.61  0.61 0.67 0.51  CALCIUM  11.3*   < > 10.0   < > 12.1* 11.5* 9.9 11.1* 10.7*  MG 1.6*  --  1.5*  --  1.6*  --  1.7  --  1.6*   < > = values in this interval not displayed.   GFR: Estimated Creatinine Clearance: 164.4 mL/min (by C-G formula based on SCr of 0.51 mg/dL). Liver Function Tests: Recent Labs  Lab 04/09/24 0629 04/10/24 0433 04/11/24 0434 04/12/24 1652 04/14/24 0558  AST 17 17 16 20 16   ALT 12 12 11 13 12   ALKPHOS 76 62 70 71 63  BILITOT 0.8 0.9 0.5 0.8 0.6  PROT 6.2* 6.3* 5.4* 6.0* 5.3*  ALBUMIN 2.7* 2.7* 2.4* 2.7* 2.3*   No results for input(s): LIPASE, AMYLASE in the last 168 hours. No results for input(s): AMMONIA in the last 168 hours. Coagulation Profile: No results for input(s): INR, PROTIME in the last 168  hours. Cardiac Enzymes: No results for input(s): CKTOTAL, CKMB, CKMBINDEX, TROPONINI in the last 168 hours. BNP (last 3 results) No results for input(s): PROBNP in the last 8760 hours. HbA1C: No results for input(s): HGBA1C in the last 72 hours. CBG: No results for input(s): GLUCAP in the last 168 hours.  Lipid Profile: No results for input(s): CHOL, HDL, LDLCALC, TRIG, CHOLHDL, LDLDIRECT in the last 72 hours. Thyroid Function Tests: No results for input(s): TSH, T4TOTAL, FREET4, T3FREE, THYROIDAB in the last 72 hours.  Anemia Panel: No results for input(s): VITAMINB12, FOLATE, FERRITIN, TIBC, IRON , RETICCTPCT in the last 72 hours. Sepsis Labs: No results for input(s): PROCALCITON, LATICACIDVEN in the last 168 hours.  No results found for this or any previous visit (from the past 240 hours).       Radiology Studies: No results found.       Scheduled Meds:  docusate sodium   100 mg Oral Daily   famotidine   20 mg Oral Q12H   ferrous sulfate   325 mg Oral Q48H   labetalol   600 mg Oral Q8H   magnesium  oxide  400 mg Oral QID   potassium chloride   40 mEq Oral TID   prenatal multivitamin  1 tablet Oral Q1200   sodium chloride  flush  3 mL Intravenous Q12H   Continuous Infusions:  sodium chloride  125 mL/hr at 04/14/24 1726     LOS: 21 days     Veronica Gordon, MD [facility]   If 7PM-7AM, please contact night-coverage [email or web address]  04/14/2024, 7:36 PM

## 2024-04-14 NOTE — Progress Notes (Signed)
 Patient ID: Beth [name], female   DOB: 1986-07-10, 38 y.o.   MRN: [id number]   S:  Denies LOF, VB, ctx, dFM. Denies HA/CP/SOB.  Feeling well today.  Taking walks around the unit when she fills up her water bottle.  Contractions on PM NST--pt did not feel.  Calcium  again increased yesterday evening and she received additional calcitonin  O:  Vitals:   04/13/24 2026 04/13/24 2308 04/14/24 0502 04/14/24 0846  BP: 127/62 126/72 119/74 (!) 108/57  Pulse: 78 68 72 86  Resp: 16 18 18 18   Temp: 98.2 F (36.8 C) 98.4 F (36.9 C) 98.2 F (36.8 C) 98.1 F (36.7 C)  TempSrc: Oral Oral Oral Oral  SpO2: 100% 100% 100% 100%  Weight:      Height:       Physical Exam:  General: no acute distress Abdomen: soft, gravid Ext: no significant edema   NST: 130, moderate variability, accels present, no decelerations, category 1 over night.  AM NST is pending Toco: uterine irritability last night     A/P: 38 yo G4P3 @ [redacted]w[redacted]d HD#23 admitted for PreE w/ SF   1) Preeclampsia: Current regimen: Labetalol  600 TID Patient has a history of severe headaches with Procardia  therefore labetalol  was the medication of choice for antihypertensive.  Labetalol  was titrated to maximum dose on 04/02/24, required slight decrease but is now stable at above regimen - Twice weekly BPP (8 out of 8 on 6/16, next 6/20), growth scan ordered for 6/20. - Continue inpatient management through delivery - Given severe preeclampsia will proceed with delivery at 34 weeks, IOL scheduled for 6/25 - Status post course of betamethasone  on 5/29 and 5/30.  Plan rescue course of betamethasone  at 33+5 (6/23) and 33+6 (6/24), or earlier if delivery is indicated sooner.   2) Primary hyperparathyroidism with associated electrolyte abnormalities: Internal medicine and endocrine following. General surgery consulted, recommend specific imaging studies not generally completed during pregnancy, will likely defer surgical management until postpartum if  patient is responsive to medical treatment. *OB team to reach out to Dr Oralee Billow on surgery team in the postpartum period.      -hypercalcemia: Ca 10.7 this AM, muscle weakness resolved.  IVF increased and calcitonin as needed per Medicine / endocrinology. 24 hr urine creatinine elevated, 24hr urine calcium  pending.       -hypokalemia: likely linked with hyperparathyroidism per Endo. Potassium still currently in normal range on potassium 40 mEq TID.       -hypomagnesemia: also likely linked with hyperparathyroidism per Endo. Mag 1.6 today, on magnesium  oxide 400 mg 4 times daily   3) Anemia of pregnancy: Status post IV iron  x 2.  First infusion on 5/30, second infusion 6/6.  Improved to 11.0 on 04/07/24, back down to 9.9 on 04/11/24, now 9.3. Could be dilutional due to IVF.  CBC not drawn with AM labs, will plan to add on tomorrow and consider additional IV iron  prn  4) History of persistent headache: Has been having persistent headaches since early in the pregnancy.  Saw neurology in consultation on the day of admission for persistent headache.  Now resolved.  Status post normal MRI of brain on hospital day #4. Tylenol  and Fioricet  as needed   5) Routine prenatal care/ MOD: q72 hour T&S. Desires VBAC. History of 2 prior successful vaginal deliveries 1 of which was a VBAC.  Pelvis proven to 10 pounds.  Baby vertex on 6/16.

## 2024-04-15 ENCOUNTER — Inpatient Hospital Stay (HOSPITAL_COMMUNITY)

## 2024-04-15 DIAGNOSIS — O09293 Supervision of pregnancy with other poor reproductive or obstetric history, third trimester: Secondary | ICD-10-CM

## 2024-04-15 DIAGNOSIS — O09523 Supervision of elderly multigravida, third trimester: Secondary | ICD-10-CM | POA: Diagnosis not present

## 2024-04-15 DIAGNOSIS — O99213 Obesity complicating pregnancy, third trimester: Secondary | ICD-10-CM

## 2024-04-15 DIAGNOSIS — O1493 Unspecified pre-eclampsia, third trimester: Secondary | ICD-10-CM | POA: Diagnosis not present

## 2024-04-15 DIAGNOSIS — E669 Obesity, unspecified: Secondary | ICD-10-CM

## 2024-04-15 DIAGNOSIS — Z3A33 33 weeks gestation of pregnancy: Secondary | ICD-10-CM

## 2024-04-15 DIAGNOSIS — O133 Gestational [pregnancy-induced] hypertension without significant proteinuria, third trimester: Secondary | ICD-10-CM | POA: Diagnosis not present

## 2024-04-15 LAB — BASIC METABOLIC PANEL WITH GFR
Anion gap: 8 (ref 5–15)
BUN: <5 mg/dL — ABNORMAL LOW (ref 6–20)
CO2: 19 mmol/L — ABNORMAL LOW (ref 22–32)
Calcium: 12.3 mg/dL — ABNORMAL HIGH (ref 8.9–10.3)
Chloride: 110 mmol/L (ref 98–111)
Creatinine, Ser: 0.61 mg/dL (ref 0.44–1.00)
GFR, Estimated: >60 mL/min (ref >60)
Glucose, Bld: 93 mg/dL (ref 70–99)
Potassium: 3.8 mmol/L (ref 3.5–5.1)
Sodium: 137 mmol/L (ref 135–145)

## 2024-04-15 LAB — CBC
HCT: 30.5 % — ABNORMAL LOW (ref 36.0–46.0)
Hemoglobin: 9.5 g/dL — ABNORMAL LOW (ref 12.0–15.0)
MCH: 25.3 pg — ABNORMAL LOW (ref 26.0–34.0)
MCHC: 31.1 g/dL (ref 30.0–36.0)
MCV: 81.1 fL (ref 80.0–100.0)
Platelets: 191 10*3/uL (ref 150–400)
RBC: 3.76 MIL/uL — ABNORMAL LOW (ref 3.87–5.11)
RDW: 22.6 % — ABNORMAL HIGH (ref 11.5–15.5)
WBC: 7 10*3/uL (ref 4.0–10.5)
nRBC: 0 % (ref 0.0–0.2)

## 2024-04-15 LAB — MAGNESIUM: Magnesium: 1.5 mg/dL — ABNORMAL LOW (ref 1.7–2.4)

## 2024-04-15 MED ORDER — MAGNESIUM SULFATE 2 GM/50ML IV SOLN
2.0000 g | Freq: Once | INTRAVENOUS | Status: AC
  Administered 2024-04-15: 2 g via INTRAVENOUS
  Filled 2024-04-15: qty 50

## 2024-04-15 MED ORDER — CALCITONIN (SALMON) 200 UNIT/ML IJ SOLN
400.0000 [IU] | Freq: Two times a day (BID) | INTRAMUSCULAR | Status: AC
  Administered 2024-04-15 – 2024-04-16 (×4): 400 [IU] via SUBCUTANEOUS
  Filled 2024-04-15 (×4): qty 2

## 2024-04-15 NOTE — Progress Notes (Signed)
 Patient ID: Beth [name], female   DOB: 25-Dec-1985, 38 y.o.   MRN: [id number]   S: Patient reports feeling well.  Active fetal movement, denies vaginal bleeding or leaking.  Denies contractions.  Denies headaches, visual changes.  O:  Vitals:   04/14/24 2306 04/15/24 0541 04/15/24 0751 04/15/24 1155  BP: 134/73 132/82 124/66 135/74  Pulse: 70 85 71 75  Resp: 17 17 18 18   Temp: 98.2 F (36.8 C) 98.2 F (36.8 C) 98 F (36.7 C) 97.7 F (36.5 C)  TempSrc: Oral Oral Oral Oral  SpO2: 100% 100% 99% 98%  Weight:      Height:       Physical Exam:  General: no acute distress Abdomen: soft, gravid Ext: no significant edema   NST: 120-130, moderate variability, accels present, no decelerations, category 1 over night.  AM NST is pending Toco: uterine irritability last night  Results for orders placed or performed during the hospital encounter of 03/24/24 (from the past 24 hours)  Magnesium      Status: Abnormal   Collection Time: 04/15/24  5:46 AM  Result Value Ref Range   Magnesium  1.5 (L) 1.7 - 2.4 mg/dL  CBC     Status: Abnormal   Collection Time: 04/15/24  5:46 AM  Result Value Ref Range   WBC 7.0 4.0 - 10.5 K/uL   RBC 3.76 (L) 3.87 - 5.11 MIL/uL   Hemoglobin 9.5 (L) 12.0 - 15.0 g/dL   HCT 40.9 (L) 81.1 - 91.4 %   MCV 81.1 80.0 - 100.0 fL   MCH 25.3 (L) 26.0 - 34.0 pg   MCHC 31.1 30.0 - 36.0 g/dL   RDW 78.2 (H) 95.6 - 21.3 %   Platelets 191 150 - 400 K/uL   nRBC 0.0 0.0 - 0.2 %  Basic metabolic panel     Status: Abnormal   Collection Time: 04/15/24  5:46 AM  Result Value Ref Range   Sodium 137 135 - 145 mmol/L   Potassium 3.8 3.5 - 5.1 mmol/L   Chloride 110 98 - 111 mmol/L   CO2 19 (L) 22 - 32 mmol/L   Glucose, Bld 93 70 - 99 mg/dL   BUN <5 (L) 6 - 20 mg/dL   Creatinine, Ser 0.86 0.44 - 1.00 mg/dL   Calcium  12.3 (H) 8.9 - 10.3 mg/dL   GFR, Estimated >57 >84 mL/min   Anion gap 8 5 - 15        A/P: 38 yo G4P3 @ [redacted]w[redacted]d HD#24 admitted for PreE w/ SF   1)  Preeclampsia: Current regimen: Labetalol  600 TID Patient has a history of severe headaches with Procardia  therefore labetalol  was the medication of choice for antihypertensive.  Labetalol  was titrated to maximum dose on 04/02/24, required slight decrease but is now stable at above regimen - Twice weekly BPP (8 out of 8 on 6/16, next 6/20), growth scan ordered for 6/20. - Continue inpatient management through delivery - Given severe preeclampsia will proceed with delivery at 34 weeks, IOL scheduled for 6/25 - Status post course of betamethasone  on 5/29 and 5/30.  Plan rescue course of betamethasone  at 33+5 (6/23) and 33+6 (6/24), or earlier if delivery is indicated sooner.   2) Primary hyperparathyroidism with associated electrolyte abnormalities: Internal medicine and endocrine following. General surgery consulted, recommend specific imaging studies not generally completed during pregnancy, will likely defer surgical management until postpartum if patient is responsive to medical treatment. *OB team to reach out to Dr Oralee Billow on surgery team in  the postpartum period.      -hypercalcemia: Ca 12.3 this AM, muscle weakness resolved.  IVF increased and calcitonin as needed per Medicine / endocrinology.  Endocrinology recommends restarting calcitonin twice daily for 48 hours.       -hypokalemia: likely linked with hyperparathyroidism per Endo. Potassium still currently in normal range on potassium 40 mEq TID.       -hypomagnesemia: also likely linked with hyperparathyroidism per Endo. Mag 1.6 today, on magnesium  oxide 400 mg 4 times daily   3) Anemia of pregnancy: Status post IV iron  x 2.  First infusion on 5/30, second infusion 6/6.  Improved to 11.0 on 04/07/24, back down to 9.9 on 04/11/24, now 9.3. Could be dilutional due to IVF.  CBC not drawn with AM labs, will plan to add on tomorrow and consider additional IV iron  prn  4) History of persistent headache: Has been having persistent headaches since  early in the pregnancy.  Saw neurology in consultation on the day of admission for persistent headache.  Now resolved.  Status post normal MRI of brain on hospital day #4. Tylenol  and Fioricet  as needed   5) Routine prenatal care/ MOD: q72 hour T&S. Desires VBAC. History of 2 prior successful vaginal deliveries 1 of which was a VBAC.  Pelvis proven to 10 pounds.  Baby vertex on 6/16.

## 2024-04-15 NOTE — Progress Notes (Signed)
 Subjective: [name] has felt tired within the past 24 hours.  She also has felt wobbly.  She describes a mild imbalance when walking.  Review of systems: Increased urination and thirst.  She suspects the symptoms are related to her calcium  again. She describes how her 24-hour urine was collected appropriately, including starting the 24 hours with an empty bladder. She continues to drink approximately 160 fluid ounces of water per day.  She continues to walk around the unit and stand in her hospital room and occasionally walking outside.     Objective: Vital signs in last 24 hours: Temp:  [97.7 F (36.5 C)-98.4 F (36.9 C)] 97.7 F (36.5 C) (06/20 1155) Pulse Rate:  [66-85] 75 (06/20 1155) Resp:  [17-18] 18 (06/20 1155) BP: (124-135)/(66-85) 135/74 (06/20 1155) SpO2:  [98 %-100 %] 98 % (06/20 1155) Weight change:  Last BM Date : 04/09/24  Intake/Output from previous day: No intake/output data recorded. Intake/Output this shift: No intake/output data recorded.  Physical exam: No apparent distress, alert and well, conversant. Heart: Regular rhythm and rate, no appreciable murmur. Lungs: Clear to auscultation. Neurologic: No focal deficits.  Speech clear.   Lab Results: Recent Labs    04/13/24 0617 04/15/24 0546  WBC 6.3 7.0  HGB 9.3* 9.5*  HCT 30.2* 30.5*  PLT 186 191   BMET Recent Labs    04/14/24 0558 04/15/24 0546  NA 138 137  K 3.9 3.8  CL 110 110  CO2 21* 19*  GLUCOSE 98 93  BUN <5* <5*  CREATININE 0.51 0.61  CALCIUM  10.7* 12.3*    Studies/Results: No results found.  Medications: I have reviewed the patient's current medications.  Assessment/Plan: 1. Primary hyperparathyroidism with hypercalcemia and hypercalciuria. 2. History of hypokalemia. 3. Mild hypomagnesemia.  Her 24-hour urine calcium  excludes familial hypocalciuric hypercalcemia and excludes acquired hypocalciuric hypercalcemia.  Today we had brief discussion about surgical approaches  to primary hyperparathyroidism.  Her urine volume during the 24-hour collection was over 5 liters.  I encouraged her to continue the weightbearing exercise.  She agreed to resume calcitonin since her hypercalcemia has worsened and is symptomatic again.   LOS: 22 days   [place] Wank 04/15/2024, 12:55 PM

## 2024-04-15 NOTE — Progress Notes (Signed)
 PROGRESS NOTE    Beth [name]  WUJ:[id number] DOB: [birth year] DOA: 03/24/2024 PCP: Patient, No Pcp Per   Brief Narrative: Beth [name] is a 38 y.o. female  G4P3 at 41 weeks past medical history significant for anxiety,  previous history of hypothyroidism, family history of renal potassium wasting, patient was admitted 5/29 with preeclampsia by OB.  We  were consulted 6/01 for hypokalemia.  She was treated with magnesium  and potassium supplements.  Her potassium has now remained in a good range.  Blood pressure has been better controlled.  Plan is to proceed with delivery at 34 weeks.  Patient has developed hypercalcemia.  We were consulted 6/14  to assist with management.  Calcium  level has been ranging around 10.5----has been slowly increasing since 6/09 ---to 11----subsequently 13.6--- Calcium  correction by albumin 14.2     Assessment & Plan:  Hypercalcemia PTH: 46, PTH like peptide pending, Vitamin D: 61, TSH. 0.6 6/14-6/15: Completed  Calcitonin BID for 2 days.  6/16: Ca down to 10. Corrected by albumin 10.9 Discussed with Dr. Kathyanne Parkers Endocrinologist, patient appears to have primary hyperparathyroidism, 24-hour urine calcium  excludes familial and acquired hypocalciuric hypercalcemia Received another day of calcitonin on 6/17, giving it prn General Surgery consulted, plan for parathyroid surgery postdelivery Continue to monitor calcium  levels   Preeclampsia Per OB  Left Ventricle Hypertrophy on EKG; ECHO Ef 55% to 60 % There is mild concentric left ventricular hypertrophy.   Hypomagnesemia Replete IV as needed, continue p.o. magnesium  oxide  Estimated body mass index is 49.65 kg/m as calculated from the following:   Height as of this encounter: 5' 11 (1.803 m).   Weight as of this encounter: 161.5 kg.  Subjective: Denies any new complaints  Objective: Vitals:   04/15/24 0541 04/15/24 0751 04/15/24 1155 04/15/24 1543  BP: 132/82 124/66 135/74 131/84  Pulse: 85 71 75 85   Resp: 17 18 18 16   Temp: 98.2 F (36.8 C) 98 F (36.7 C) 97.7 F (36.5 C) 97.6 F (36.4 C)  TempSrc: Oral Oral Oral Oral  SpO2: 100% 99% 98% 98%  Weight:      Height:       No intake or output data in the 24 hours ending 04/15/24 1834  Filed Weights   03/24/24 1417  Weight: (!) 161.5 kg    Examination: General: NAD  Cardiovascular: S1, S2 present Respiratory: CTAB Musculoskeletal: Trace bilateral pedal edema noted Skin: Normal Psychiatry: Normal mood    Data Reviewed: I have personally reviewed following labs and imaging studies  CBC: Recent Labs  Lab 04/10/24 0803 04/13/24 0617 04/15/24 0546  WBC 6.8 6.3 7.0  HGB 9.9* 9.3* 9.5*  HCT 31.9* 30.2* 30.5*  MCV 79.9* 80.5 81.1  PLT 187 186 191   Basic Metabolic Panel: Recent Labs  Lab 04/11/24 0434 04/11/24 0853 04/12/24 0950 04/12/24 1652 04/13/24 0617 04/13/24 1651 04/14/24 0558 04/15/24 0546  NA 134*  --  137 134* 135 136 138 137  K 3.9  --  3.8 3.8 3.7 4.3 3.9 3.8  CL 110  --  110 109 109 111 110 110  CO2 19*  --  19* 19* 19* 19* 21* 19*  GLUCOSE 100*  --  87 110* 91 94 98 93  BUN <5*  --  <5* <5* <5* <5* <5* <5*  CREATININE 0.55  --  0.60 0.61 0.61 0.67 0.51 0.61  CALCIUM  10.0   < > 12.1* 11.5* 9.9 11.1* 10.7* 12.3*  MG 1.5*  --  1.6*  --  1.7  --  1.6* 1.5*   < > = values in this interval not displayed.   GFR: Estimated Creatinine Clearance: 164.4 mL/min (by C-G formula based on SCr of 0.61 mg/dL). Liver Function Tests: Recent Labs  Lab 04/09/24 0629 04/10/24 0433 04/11/24 0434 04/12/24 1652 04/14/24 0558  AST 17 17 16 20 16   ALT 12 12 11 13 12   ALKPHOS 76 62 70 71 63  BILITOT 0.8 0.9 0.5 0.8 0.6  PROT 6.2* 6.3* 5.4* 6.0* 5.3*  ALBUMIN 2.7* 2.7* 2.4* 2.7* 2.3*   No results for input(s): LIPASE, AMYLASE in the last 168 hours. No results for input(s): AMMONIA in the last 168 hours. Coagulation Profile: No results for input(s): INR, PROTIME in the last 168 hours. Cardiac  Enzymes: No results for input(s): CKTOTAL, CKMB, CKMBINDEX, TROPONINI in the last 168 hours. BNP (last 3 results) No results for input(s): PROBNP in the last 8760 hours. HbA1C: No results for input(s): HGBA1C in the last 72 hours. CBG: No results for input(s): GLUCAP in the last 168 hours.  Lipid Profile: No results for input(s): CHOL, HDL, LDLCALC, TRIG, CHOLHDL, LDLDIRECT in the last 72 hours. Thyroid Function Tests: No results for input(s): TSH, T4TOTAL, FREET4, T3FREE, THYROIDAB in the last 72 hours.  Anemia Panel: No results for input(s): VITAMINB12, FOLATE, FERRITIN, TIBC, IRON , RETICCTPCT in the last 72 hours. Sepsis Labs: No results for input(s): PROCALCITON, LATICACIDVEN in the last 168 hours.  No results found for this or any previous visit (from the past 240 hours).       Radiology Studies: US  MFM FETAL BPP WO NON STRESS Result Date: 04/15/2024 ----------------------------------------------------------------------  OBSTETRICS REPORT                       (Signed Final 04/15/2024 01:18 pm) ---------------------------------------------------------------------- Patient Info  ID #:       [id number]                          D.O.B.:  1986/08/11 (37 yrs)(F)  Name:       Beth [name]                    Visit Date: 04/15/2024 11:39 am ---------------------------------------------------------------------- Performed By  Attending:        Penney Bowling DO       Ref. Address:     [facility]  Performed By:     Lonny Robertson       Secondary Phy.:   [facility]                    RDMS                                                             Suites  Referred By:      Leanne Pronto         [facility] ---------------------------------------------------------------------- Orders  #   Description                           Code        Ordered By  1  US  MFM FETAL BPP WO NON               76819.01    KELSEY SMITH     STRESS  2  US  MFM OB FOLLOW UP                   76816.01    Jeneane Miracle ----------------------------------------------------------------------  #  Order #                     Accession #                Episode #  1  [id number]                   [id number]                 [id number]  2  [id number]                   [id number]                 [id number] ---------------------------------------------------------------------- Indications  Pre-eclampsia                                  O14.90  Hypertension - Gestational                     O43.9  Advanced maternal age multigravida 35+,        O63.523  third trimester  Obesity complicating pregnancy, third          O99.213  trimester  Poor obstetric history: Prior fetal            O09.299  macrosomia, antepartum  [redacted] weeks gestation of pregnancy                Z3A.33 ---------------------------------------------------------------------- Fetal Evaluation  Num Of Fetuses:         1  Fetal Heart Rate(bpm):  137  Cardiac Activity:       Observed  Presentation:           Cephalic  Placenta:               Posterior  P. Cord Insertion:      Visualized, central  Amniotic Fluid  AFI FV:      Within normal limits  AFI Sum(cm)     %Tile       Largest Pocket(cm)  16.3            59          4.9  RUQ(cm)       RLQ(cm)       LUQ(cm)        LLQ(cm)  4.9           3.7  3.3            4.4 ---------------------------------------------------------------------- Biophysical Evaluation  Amniotic F.V:   Pocket => 2 cm             F. Tone:        Observed  F. Movement:    Observed                   Score:          8/8  F. Breathing:   Observed ---------------------------------------------------------------------- Biometry  BPD:      90.5  mm     G. Age:  36w 5d       > 99  %    CI:        69.99   %    70 - 86                                                           FL/HC:      19.6   %    19.9 - 21.5  HC:      345.1  mm     G. Age:  39w 6d       > 99  %    HC/AC:      1.03        0.96 - 1.11  AC:      336.5  mm     G. Age:  37w 4d       > 99  %    FL/BPD:     74.8   %    71 - 87  FL:       67.7  mm     G. Age:  34w 6d         78  %    FL/AC:      20.1   %    20 - 24  Est. FW:    3102  gm    6 lb 13 oz    > 99  % ---------------------------------------------------------------------- OB History  Gravidity:    4         Term:   3  Living:       3 ---------------------------------------------------------------------- Gestational Age  Clinical EDD:  33w 2d                                        EDD:   06/01/24  U/S Today:     37w 2d                                        EDD:   05/04/24  Best:          33w 2d     Det. By:  Clinical EDD             EDD:   06/01/24 ---------------------------------------------------------------------- Anatomy  Ventricles:            Not well visualized    Stomach:                Appears normal, left  sided  Heart:                 Appears normal         Kidneys:                Not well visualized                         (4CH, axis, and                         situs)  Diaphragm:             Appears normal         Bladder:                Appears normal ---------------------------------------------------------------------- Comments  Sonographic findings  Single intrauterine pregnancy.  Fetal cardiac activity: Observed.  Presentation: Cephalic.  Interval fetal anatomy appears normal.  Fetal biometry shows the estimated fetal weight at the > 99  percentile (6lb13oz)  Amniotic fluid: Within normal limits.  MVP: 4.9 cm.  Placenta: Posterior.  BPP: 8/8.  There are limitations of prenatal ultrasound such as the  inability to detect certain abnormalities due to poor  visualization. Various factors such as fetal position,  gestational age and maternal body habitus may increase the  difficulty in  visualizing the fetal anatomy.  Recommendations  -Continue plan of care outlined in Epic previously. Plan to  deliver at 34w due to preeclampsia. ----------------------------------------------------------------------                  Penney Bowling, DO Electronically Signed Final Report   04/15/2024 01:18 pm ----------------------------------------------------------------------   US  MFM OB FOLLOW UP Result Date: 04/15/2024 ----------------------------------------------------------------------  OBSTETRICS REPORT                       (Signed Final 04/15/2024 01:18 pm) ---------------------------------------------------------------------- Patient Info  ID #:       [id number]                          D.O.B.:  05-Aug-1986 (37 yrs)(F)  Name:       Beth [name]                    Visit Date: 04/15/2024 11:39 am ---------------------------------------------------------------------- Performed By  Attending:        Penney Bowling DO       Ref. Address:     [facility]  Performed By:     Lonny Robertson       Secondary Phy.:   [facility]                    RDMS  Suites  Referred By:      Leanne Pronto         [facility] ---------------------------------------------------------------------- Orders  #  Description                           Code        Ordered By  1  US  MFM FETAL BPP WO NON               76819.01    KELSEY SMITH     STRESS  2  US  MFM OB FOLLOW UP                   [id number]    Jeneane Miracle ----------------------------------------------------------------------  #  Order #                     Accession #                Episode #  1  [id number]                   [id number]                 [id number]  2  [id number]                   [id number]                 [id number]  ---------------------------------------------------------------------- Indications  Pre-eclampsia                                  O14.90  Hypertension - Gestational                     O12.9  Advanced maternal age multigravida 47+,        O51.523  third trimester  Obesity complicating pregnancy, third          O99.213  trimester  Poor obstetric history: Prior fetal            O09.299  macrosomia, antepartum  [redacted] weeks gestation of pregnancy                Z3A.33 ---------------------------------------------------------------------- Fetal Evaluation  Num Of Fetuses:         1  Fetal Heart Rate(bpm):  137  Cardiac Activity:       Observed  Presentation:           Cephalic  Placenta:               Posterior  P. Cord Insertion:      Visualized, central  Amniotic Fluid  AFI FV:      Within normal limits  AFI Sum(cm)     %Tile       Largest Pocket(cm)  16.3            59          4.9  RUQ(cm)  RLQ(cm)       LUQ(cm)        LLQ(cm)  4.9           3.7           3.3            4.4 ---------------------------------------------------------------------- Biophysical Evaluation  Amniotic F.V:   Pocket => 2 cm             F. Tone:        Observed  F. Movement:    Observed                   Score:          8/8  F. Breathing:   Observed ---------------------------------------------------------------------- Biometry  BPD:      90.5  mm     G. Age:  36w 5d       > 99  %    CI:        69.99   %    70 - 86                                                          FL/HC:      19.6   %    19.9 - 21.5  HC:      345.1  mm     G. Age:  39w 6d       > 99  %    HC/AC:      1.03        0.96 - 1.11  AC:      336.5  mm     G. Age:  37w 4d       > 99  %    FL/BPD:     74.8   %    71 - 87  FL:       67.7  mm     G. Age:  34w 6d         78  %    FL/AC:      20.1   %    20 - 24  Est. FW:    3102  gm    6 lb 13 oz    > 99  % ---------------------------------------------------------------------- OB History  Gravidity:    4         Term:   3  Living:        3 ---------------------------------------------------------------------- Gestational Age  Clinical EDD:  33w 2d                                        EDD:   06/01/24  U/S Today:     37w 2d                                        EDD:   05/04/24  Best:          33w 2d     Det. By:  Clinical EDD             EDD:   06/01/24 ---------------------------------------------------------------------- Anatomy  Ventricles:  Not well visualized    Stomach:                Appears normal, left                                                                        sided  Heart:                 Appears normal         Kidneys:                Not well visualized                         (4CH, axis, and                         situs)  Diaphragm:             Appears normal         Bladder:                Appears normal ---------------------------------------------------------------------- Comments  Sonographic findings  Single intrauterine pregnancy.  Fetal cardiac activity: Observed.  Presentation: Cephalic.  Interval fetal anatomy appears normal.  Fetal biometry shows the estimated fetal weight at the > 99  percentile (6lb13oz)  Amniotic fluid: Within normal limits.  MVP: 4.9 cm.  Placenta: Posterior.  BPP: 8/8.  There are limitations of prenatal ultrasound such as the  inability to detect certain abnormalities due to poor  visualization. Various factors such as fetal position,  gestational age and maternal body habitus may increase the  difficulty in visualizing the fetal anatomy.  Recommendations  -Continue plan of care outlined in Epic previously. Plan to  deliver at 34w due to preeclampsia. ----------------------------------------------------------------------                  Penney Bowling, DO Electronically Signed Final Report   04/15/2024 01:18 pm ----------------------------------------------------------------------         Scheduled Meds:  calcitonin  400 Units Subcutaneous BID   docusate sodium   100 mg Oral  Daily   famotidine   20 mg Oral Q12H   ferrous sulfate   325 mg Oral Q48H   labetalol   600 mg Oral Q8H   magnesium  oxide  400 mg Oral QID   potassium chloride   40 mEq Oral TID   prenatal multivitamin  1 tablet Oral Q1200   sodium chloride  flush  3 mL Intravenous Q12H   Continuous Infusions:  sodium chloride  125 mL/hr at 04/15/24 0908     LOS: 22 days     Veronica Gordon, MD [facility]   If 7PM-7AM, please contact night-coverage [email or web address]  04/15/2024, 6:34 PM

## 2024-04-16 LAB — BASIC METABOLIC PANEL WITH GFR
Anion gap: 8 (ref 5–15)
BUN: <5 mg/dL — ABNORMAL LOW (ref 6–20)
CO2: 18 mmol/L — ABNORMAL LOW (ref 22–32)
Calcium: 10.1 mg/dL (ref 8.9–10.3)
Chloride: 111 mmol/L (ref 98–111)
Creatinine, Ser: 0.67 mg/dL (ref 0.44–1.00)
GFR, Estimated: >60 mL/min (ref >60)
Glucose, Bld: 124 mg/dL — ABNORMAL HIGH (ref 70–99)
Potassium: 3.9 mmol/L (ref 3.5–5.1)
Sodium: 137 mmol/L (ref 135–145)

## 2024-04-16 LAB — PTH-RELATED PEPTIDE: PTH-related peptide: <2 pmol/L

## 2024-04-16 LAB — MAGNESIUM: Magnesium: 1.6 mg/dL — ABNORMAL LOW (ref 1.7–2.4)

## 2024-04-16 MED ORDER — MAGNESIUM SULFATE 4 GM/100ML IV SOLN
4.0000 g | Freq: Once | INTRAVENOUS | Status: AC
  Administered 2024-04-16: 4 g via INTRAVENOUS
  Filled 2024-04-16: qty 100

## 2024-04-16 NOTE — Progress Notes (Signed)
 PROGRESS NOTE    Beth [name]  FMW:[id number] DOB: 24-Jan-1986 DOA: 03/24/2024 PCP: Patient, No Pcp Per   Brief Narrative: Beth [name] is a 38 y.o. female  G4P3 at 60 weeks past medical history significant for anxiety,  previous history of hypothyroidism, family history of renal potassium wasting, patient was admitted 5/29 with preeclampsia by OB.  We  were consulted 6/01 for hypokalemia.  She was treated with magnesium  and potassium supplements.  Her potassium has now remained in a good range.  Blood pressure has been better controlled.  Plan is to proceed with delivery at 34 weeks.  Patient has developed hypercalcemia.  We were consulted 6/14  to assist with management.  Calcium  level has been ranging around 10.5----has been slowly increasing since 6/09 ---to 11----subsequently 13.6--- Calcium  correction by albumin 14.2     Assessment & Plan:  Hypercalcemia Primary hyperparathyroidism PTH: 46, PTH like peptide pending, Vitamin D : 61, TSH. 0.6 Dr. Faythe Endocrinologist on board, patient appears to have primary hyperparathyroidism, 24-hour urine calcium  excludes familial and acquired hypocalciuric hypercalcemia Continue calcitonin, IV fluids General Surgery consulted, plan for parathyroid  surgery postdelivery (scheduled for induction on 6/25) Continue to monitor calcium  levels   Preeclampsia Per OB  Left Ventricle Hypertrophy on EKG; ECHO Ef 55% to 60 % There is mild concentric left ventricular hypertrophy.   Hypomagnesemia Replete IV as needed, continue p.o. magnesium  oxide  Estimated body mass index is 49.65 kg/m as calculated from the following:   Height as of this encounter: 5' 11 (1.803 m).   Weight as of this encounter: 161.5 kg.  Subjective: Denies any new complaints  Objective: Vitals:   04/16/24 0749 04/16/24 0752 04/16/24 1226 04/16/24 1625  BP: 121/67 121/67 127/64 122/67  Pulse: 68 78 70 74  Resp: 18     Temp: 98.1 F (36.7 C)  98.4 F (36.9 C) 98.2 F (36.8  C)  TempSrc: Oral  Oral Oral  SpO2: 97%  100%   Weight:      Height:        Intake/Output Summary (Last 24 hours) at 04/16/2024 1719 Last data filed at 04/16/2024 1000 Gross per 24 hour  Intake 8526.56 ml  Output --  Net 8526.56 ml    Filed Weights   03/24/24 1417  Weight: (!) 161.5 kg    Examination: General: NAD  Cardiovascular: S1, S2 present Respiratory: CTAB Musculoskeletal: Trace bilateral pedal edema noted Skin: Normal Psychiatry: Normal mood    Data Reviewed: I have personally reviewed following labs and imaging studies  CBC: Recent Labs  Lab 04/10/24 0803 04/13/24 0617 04/15/24 0546  WBC 6.8 6.3 7.0  HGB 9.9* 9.3* 9.5*  HCT 31.9* 30.2* 30.5*  MCV 79.9* 80.5 81.1  PLT 187 186 191   Basic Metabolic Panel: Recent Labs  Lab 04/12/24 0950 04/12/24 1652 04/13/24 0617 04/13/24 1651 04/14/24 0558 04/15/24 0546 04/16/24 0428 04/16/24 0958  NA 137   < > 135 136 138 137  --  137  K 3.8   < > 3.7 4.3 3.9 3.8  --  3.9  CL 110   < > 109 111 110 110  --  111  CO2 19*   < > 19* 19* 21* 19*  --  18*  GLUCOSE 87   < > 91 94 98 93  --  124*  BUN <5*   < > <5* <5* <5* <5*  --  <5*  CREATININE 0.60   < > 0.61 0.67 0.51 0.61  --  0.67  CALCIUM  12.1*   < > 9.9 11.1* 10.7* 12.3*  --  10.1  MG 1.6*  --  1.7  --  1.6* 1.5* 1.6*  --    < > = values in this interval not displayed.   GFR: Estimated Creatinine Clearance: 164.4 mL/min (by C-G formula based on SCr of 0.67 mg/dL). Liver Function Tests: Recent Labs  Lab 04/10/24 0433 04/11/24 0434 04/12/24 1652 04/14/24 0558  AST 17 16 20 16   ALT 12 11 13 12   ALKPHOS 62 70 71 63  BILITOT 0.9 0.5 0.8 0.6  PROT 6.3* 5.4* 6.0* 5.3*  ALBUMIN 2.7* 2.4* 2.7* 2.3*   No results for input(s): LIPASE, AMYLASE in the last 168 hours. No results for input(s): AMMONIA in the last 168 hours. Coagulation Profile: No results for input(s): INR, PROTIME in the last 168 hours. Cardiac Enzymes: No results for  input(s): CKTOTAL, CKMB, CKMBINDEX, TROPONINI in the last 168 hours. BNP (last 3 results) No results for input(s): PROBNP in the last 8760 hours. HbA1C: No results for input(s): HGBA1C in the last 72 hours. CBG: No results for input(s): GLUCAP in the last 168 hours.  Lipid Profile: No results for input(s): CHOL, HDL, LDLCALC, TRIG, CHOLHDL, LDLDIRECT in the last 72 hours. Thyroid  Function Tests: No results for input(s): TSH, T4TOTAL, FREET4, T3FREE, THYROIDAB in the last 72 hours.  Anemia Panel: No results for input(s): VITAMINB12, FOLATE, FERRITIN, TIBC, IRON , RETICCTPCT in the last 72 hours. Sepsis Labs: No results for input(s): PROCALCITON, LATICACIDVEN in the last 168 hours.  No results found for this or any previous visit (from the past 240 hours).       Radiology Studies: US  MFM FETAL BPP WO NON STRESS Result Date: 04/15/2024 ----------------------------------------------------------------------  OBSTETRICS REPORT                       (Signed Final 04/15/2024 01:18 pm) ---------------------------------------------------------------------- Patient Info  ID #:       [id number]                          D.O.B.:  15-Nov-1985 (37 yrs)(F)  Name:       Beth [name]                    Visit Date: 04/15/2024 11:39 am ---------------------------------------------------------------------- Performed By  Attending:        Delora Smaller DO       Ref. Address:     [facility]  Performed By:     Dwayne Blunt       Secondary Phy.:   [facility]                    RDMS                                                             Suites  Referred By:  KELSEY A SMITH         [facility] ---------------------------------------------------------------------- Orders  #  Description                            Code        Ordered By  1  US  MFM FETAL BPP WO NON               76819.01    KELSEY SMITH     STRESS  2  US  MFM OB FOLLOW UP                   [id number]    LARRAINE SHARPS ----------------------------------------------------------------------  #  Order #                     Accession #                Episode #  1  [id number]                   [id number]                 [id number]  2  [id number]                   [id number]                 [id number] ---------------------------------------------------------------------- Indications  Pre-eclampsia                                  O14.90  Hypertension - Gestational                     O68.9  Advanced maternal age multigravida 3+,        O57.523  third trimester  Obesity complicating pregnancy, third          O99.213  trimester  Poor obstetric history: Prior fetal            O09.299  macrosomia, antepartum  [redacted] weeks gestation of pregnancy                Z3A.33 ---------------------------------------------------------------------- Fetal Evaluation  Num Of Fetuses:         1  Fetal Heart Rate(bpm):  137  Cardiac Activity:       Observed  Presentation:           Cephalic  Placenta:               Posterior  P. Cord Insertion:      Visualized, central  Amniotic Fluid  AFI FV:      Within normal limits  AFI Sum(cm)     %Tile       Largest Pocket(cm)  16.3            59          4.9  RUQ(cm)       RLQ(cm)       LUQ(cm)  LLQ(cm)  4.9           3.7           3.3            4.4 ---------------------------------------------------------------------- Biophysical Evaluation  Amniotic F.V:   Pocket => 2 cm             F. Tone:        Observed  F. Movement:    Observed                   Score:          8/8  F. Breathing:   Observed ---------------------------------------------------------------------- Biometry  BPD:      90.5  mm     G. Age:  36w 5d       > 99  %    CI:        69.99   %    70 - 86                                                          FL/HC:      19.6   %     19.9 - 21.5  HC:      345.1  mm     G. Age:  39w 6d       > 99  %    HC/AC:      1.03        0.96 - 1.11  AC:      336.5  mm     G. Age:  37w 4d       > 99  %    FL/BPD:     74.8   %    71 - 87  FL:       67.7  mm     G. Age:  34w 6d         78  %    FL/AC:      20.1   %    20 - 24  Est. FW:    3102  gm    6 lb 13 oz    > 99  % ---------------------------------------------------------------------- OB History  Gravidity:    4         Term:   3  Living:       3 ---------------------------------------------------------------------- Gestational Age  Clinical EDD:  33w 2d                                        EDD:   06/01/24  U/S Today:     37w 2d                                        EDD:   05/04/24  Best:          33w 2d     Det. By:  Clinical EDD             EDD:   06/01/24 ---------------------------------------------------------------------- Anatomy  Ventricles:            Not well visualized  Stomach:                Appears normal, left                                                                        sided  Heart:                 Appears normal         Kidneys:                Not well visualized                         (4CH, axis, and                         situs)  Diaphragm:             Appears normal         Bladder:                Appears normal ---------------------------------------------------------------------- Comments  Sonographic findings  Single intrauterine pregnancy.  Fetal cardiac activity: Observed.  Presentation: Cephalic.  Interval fetal anatomy appears normal.  Fetal biometry shows the estimated fetal weight at the > 99  percentile (6lb13oz)  Amniotic fluid: Within normal limits.  MVP: 4.9 cm.  Placenta: Posterior.  BPP: 8/8.  There are limitations of prenatal ultrasound such as the  inability to detect certain abnormalities due to poor  visualization. Various factors such as fetal position,  gestational age and maternal body habitus may increase the  difficulty in visualizing the fetal  anatomy.  Recommendations  -Continue plan of care outlined in Epic previously. Plan to  deliver at 34w due to preeclampsia. ----------------------------------------------------------------------                  Delora Smaller, DO Electronically Signed Final Report   04/15/2024 01:18 pm ----------------------------------------------------------------------   US  MFM OB FOLLOW UP Result Date: 04/15/2024 ----------------------------------------------------------------------  OBSTETRICS REPORT                       (Signed Final 04/15/2024 01:18 pm) ---------------------------------------------------------------------- Patient Info  ID #:       [id number]                          D.O.B.:  [birth year] (37 yrs)(F)  Name:       Beth [name]                    Visit Date: 04/15/2024 11:39 am ---------------------------------------------------------------------- Performed By  Attending:        Delora Smaller DO       Ref. Address:     [facility]  Performed By:     Dwayne Blunt  Secondary Phy.:   [facility]                    RDMS                                                             Suites  Referred By:      LARRAINE DELENA SHARPS         [facility] ---------------------------------------------------------------------- Orders  #  Description                           Code        Ordered By  1  US  MFM FETAL BPP WO NON               76819.01    KELSEY SMITH     STRESS  2  US  MFM OB FOLLOW UP                   [id number]    LARRAINE SHARPS ----------------------------------------------------------------------  #  Order #                     Accession #                Episode #  1  [id number]                   [id number]                 [id number]  2  [id number]                   [id number]                 [id number]  ---------------------------------------------------------------------- Indications  Pre-eclampsia                                  O14.90  Hypertension - Gestational                     O76.9  Advanced maternal age multigravida 51+,        O4.523  third trimester  Obesity complicating pregnancy, third          O99.213  trimester  Poor obstetric history: Prior fetal            O09.299  macrosomia, antepartum  [redacted] weeks gestation of pregnancy                Z3A.33 ---------------------------------------------------------------------- Fetal Evaluation  Num Of Fetuses:         1  Fetal Heart Rate(bpm):  137  Cardiac Activity:       Observed  Presentation:           Cephalic  Placenta:  Posterior  P. Cord Insertion:      Visualized, central  Amniotic Fluid  AFI FV:      Within normal limits  AFI Sum(cm)     %Tile       Largest Pocket(cm)  16.3            59          4.9  RUQ(cm)       RLQ(cm)       LUQ(cm)        LLQ(cm)  4.9           3.7           3.3            4.4 ---------------------------------------------------------------------- Biophysical Evaluation  Amniotic F.V:   Pocket => 2 cm             F. Tone:        Observed  F. Movement:    Observed                   Score:          8/8  F. Breathing:   Observed ---------------------------------------------------------------------- Biometry  BPD:      90.5  mm     G. Age:  36w 5d       > 99  %    CI:        69.99   %    70 - 86                                                          FL/HC:      19.6   %    19.9 - 21.5  HC:      345.1  mm     G. Age:  39w 6d       > 99  %    HC/AC:      1.03        0.96 - 1.11  AC:      336.5  mm     G. Age:  37w 4d       > 99  %    FL/BPD:     74.8   %    71 - 87  FL:       67.7  mm     G. Age:  34w 6d         78  %    FL/AC:      20.1   %    20 - 24  Est. FW:    3102  gm    6 lb 13 oz    > 99  % ---------------------------------------------------------------------- OB History  Gravidity:    4         Term:   3  Living:        3 ---------------------------------------------------------------------- Gestational Age  Clinical EDD:  33w 2d                                        EDD:   06/01/24  U/S Today:     37w 2d  EDD:   05/04/24  Best:          33w 2d     Det. By:  Clinical EDD             EDD:   06/01/24 ---------------------------------------------------------------------- Anatomy  Ventricles:            Not well visualized    Stomach:                Appears normal, left                                                                        sided  Heart:                 Appears normal         Kidneys:                Not well visualized                         (4CH, axis, and                         situs)  Diaphragm:             Appears normal         Bladder:                Appears normal ---------------------------------------------------------------------- Comments  Sonographic findings  Single intrauterine pregnancy.  Fetal cardiac activity: Observed.  Presentation: Cephalic.  Interval fetal anatomy appears normal.  Fetal biometry shows the estimated fetal weight at the > 99  percentile (6lb13oz)  Amniotic fluid: Within normal limits.  MVP: 4.9 cm.  Placenta: Posterior.  BPP: 8/8.  There are limitations of prenatal ultrasound such as the  inability to detect certain abnormalities due to poor  visualization. Various factors such as fetal position,  gestational age and maternal body habitus may increase the  difficulty in visualizing the fetal anatomy.  Recommendations  -Continue plan of care outlined in Epic previously. Plan to  deliver at 34w due to preeclampsia. ----------------------------------------------------------------------                  Delora Smaller, DO Electronically Signed Final Report   04/15/2024 01:18 pm ----------------------------------------------------------------------         Scheduled Meds:  calcitonin  400 Units Subcutaneous BID   docusate sodium   100 mg Oral  Daily   famotidine   20 mg Oral Q12H   ferrous sulfate   325 mg Oral Q48H   labetalol   600 mg Oral Q8H   magnesium  oxide  400 mg Oral QID   potassium chloride   40 mEq Oral TID   prenatal multivitamin  1 tablet Oral Q1200   sodium chloride  flush  3 mL Intravenous Q12H   Continuous Infusions:  sodium chloride  125 mL/hr at 04/16/24 0349     LOS: 23 days     Lebron JINNY Cage, MD [facility]   If 7PM-7AM, please contact night-coverage [email or web address]  04/16/2024, 5:19 PM

## 2024-04-16 NOTE — Progress Notes (Signed)
 Patient ID: Beth [name], female   DOB: 24-Oct-1986, 38 y.o.   MRN: [id number] Pt doing well but feels fatigued and weak after hot shower - states occurs occasionally. No cramping or bleeding. No HA or blurry vision. +Fms.   Today's Vitals   04/15/24 2359 04/16/24 0418 04/16/24 0749 04/16/24 0752  BP: 135/81 121/72 121/67 121/67  Pulse: 70 83 68 78  Resp: 17 16 18    Temp: 97.7 F (36.5 C) 98.3 F (36.8 C) 98.1 F (36.7 C)   TempSrc: Oral Oral Oral   SpO2: 100% 100% 97%   Weight:      Height:      PainSc:   0-No pain    Body mass index is 49.65 kg/m.  GEN - NAD, AAOx s , sitting up in bed ABD - CW GA EXT - no homans , no significant edema  ERM - not done yet ; pt requests to wait till after breakfast  TOCO - not done yet ; see above  SVE - deferred    US  04/15/24:  SIUP, vertex, LGA >99% 6lbs 13oz , nl afi, post plac, 8/8 BPP  Mg 1.6 Ca 12.3 Hg 9.5 Plts 191  A/P: 38 yo G4P3 @ [redacted]w[redacted]d HD#24 admitted for PreE w/ SF   1) Preeclampsia: Current regimen: Labetalol  600 TID Patient has a history of severe headaches with Procardia  therefore labetalol  was the medication of choice for antihypertensive.  Labetalol  was titrated to maximum dose on 04/02/24, required slight decrease but is now stable at above regimen - Twice weekly BPP (8 out of 8 on 6/16, last 6/20), LGA noted - Continue inpatient management through delivery - Given severe preeclampsia will proceed with delivery at 34 weeks, IOL scheduled for 6/25 - Status post course of betamethasone  on 5/29 and 5/30.  Plan rescue course of betamethasone  at 33+5 (6/23) and 33+6 (6/24), or earlier if delivery is indicated sooner.   2) Primary hyperparathyroidism with associated electrolyte abnormalities: Internal medicine and endocrine following. General surgery consulted, recommend specific imaging studies not generally completed during pregnancy, will likely defer surgical management until postpartum if patient is responsive to medical  treatment. *OB team to reach out to Dr [name] on surgery team in the postpartum period.      -hypercalcemia: Ca 12.3 on 6/20, muscle weakness resolved.  IVF increased and calcitonin as needed per Medicine / endocrinology.  Endocrinology recommends restarting calcitonin twice daily for 48 hours.       -hypokalemia: likely linked with hyperparathyroidism per Endo. Potassium still currently in normal range on potassium 40 mEq TID.       -hypomagnesemia: also likely linked with hyperparathyroidism per Endo. Mag 1.6 today, on magnesium  oxide 400 mg 4 times daily   3) Anemia of pregnancy: Status post IV iron  x 2.  First infusion on 5/30, second infusion 6/6.  Improved to 11.0 on 04/07/24, back down to 9.9 on 04/11/24, now 9.5.  Could be dilutional due to IVF.  Recheck daily and consider additional IV iron  prn a day prior to delivery (IOL planned for 04/20/24)  4) LGA - anticipate adequate for svd given proven pelvis to 10lbs   5) History of persistent headache: Has been having persistent headaches since early in the pregnancy.  Saw neurology in consultation on the day of admission for persistent headache.  Now resolved.  Status post normal MRI of brain on hospital day #4. Tylenol  and Fioricet  as needed   6) Routine prenatal care/ MOD: q72 hour T&S. Desires VBAC.  History of 2 prior successful vaginal deliveries 1 of which was a VBAC.  Pelvis proven to 10 pounds.  Baby vertex per US  on 04/15/24

## 2024-04-16 NOTE — Plan of Care (Signed)
  Problem: Health Behavior/Discharge Planning: Goal: Ability to manage health-related needs will improve Outcome: Progressing   Problem: Clinical Measurements: Goal: Ability to maintain clinical measurements within normal limits will improve Outcome: Progressing Goal: Will remain free from infection Outcome: Progressing Goal: Diagnostic test results will improve Outcome: Progressing   Problem: Coping: Goal: Level of anxiety will decrease Outcome: Progressing   Problem: Pain Managment: Goal: General experience of comfort will improve and/or be controlled Outcome: Progressing   Problem: Safety: Goal: Ability to remain free from injury will improve Outcome: Progressing   Problem: Skin Integrity: Goal: Risk for impaired skin integrity will decrease Outcome: Progressing   Problem: Education: Goal: Knowledge of disease or condition will improve Outcome: Progressing Goal: Knowledge of the prescribed therapeutic regimen will improve Outcome: Progressing Goal: Individualized Educational Video(s) Outcome: Progressing   Problem: Clinical Measurements: Goal: Complications related to the disease process, condition or treatment will be avoided or minimized Outcome: Progressing   Problem: Education: Goal: Knowledge of disease or condition will improve Outcome: Progressing Goal: Knowledge of the prescribed therapeutic regimen will improve Outcome: Progressing   Problem: Fluid Volume: Goal: Peripheral tissue perfusion will improve Outcome: Progressing   Problem: Clinical Measurements: Goal: Complications related to disease process, condition or treatment will be avoided or minimized Outcome: Progressing

## 2024-04-16 NOTE — Progress Notes (Signed)
 Subjective: Beth [name] felt weak after a hot shower today.   No focal weakness.  She addressed this with obstetrician, Dr. Delana, this morning.   She continues to drink approximately 160 fluid ounces of water per day.  She walked yesterday, but not as much weight bearing exercise today.   Objective: Vital signs in last 24 hours: Temp:  [97.6 F (36.4 C)-98.3 F (36.8 C)] 98.1 F (36.7 C) (06/21 0749) Pulse Rate:  [68-85] 78 (06/21 0752) Resp:  [16-18] 18 (06/21 0749) BP: (121-135)/(67-89) 121/67 (06/21 0752) SpO2:  [97 %-100 %] 97 % (06/21 0749)  General appearance: alert Cardio: regular rate and rhythm  Lab Results: Recent Labs    04/15/24 0546  WBC 7.0  HGB 9.5*  HCT 30.5*  PLT 191   BMET Recent Labs    04/14/24 0558 04/15/24 0546  NA 138 137  K 3.9 3.8  CL 110 110  CO2 21* 19*  GLUCOSE 98 93  BUN <5* <5*  CREATININE 0.51 0.61  CALCIUM  10.7* 12.3*    Studies/Results:  Medications: I have reviewed the patient's current medications.  Assessment/Plan: Primary hyperparathyroidism with hypercalcemia and hypercalciuria.   2.   History of hypokalemia 3.   Hypomagnesemia  Today I ordered daily basic metabolic panel to monitor calcium  and potassium.  Continue with calcitonin for now.     LOS: 23 days   Eleanore Junio 04/16/2024, 9:52 AM

## 2024-04-17 LAB — BASIC METABOLIC PANEL WITH GFR
Anion gap: 5 (ref 5–15)
BUN: <5 mg/dL — ABNORMAL LOW (ref 6–20)
CO2: 19 mmol/L — ABNORMAL LOW (ref 22–32)
Calcium: 10.7 mg/dL — ABNORMAL HIGH (ref 8.9–10.3)
Chloride: 111 mmol/L (ref 98–111)
Creatinine, Ser: 0.59 mg/dL (ref 0.44–1.00)
GFR, Estimated: >60 mL/min (ref >60)
Glucose, Bld: 92 mg/dL (ref 70–99)
Potassium: 3.8 mmol/L (ref 3.5–5.1)
Sodium: 135 mmol/L (ref 135–145)

## 2024-04-17 LAB — FERRITIN: Ferritin: 47 ng/mL (ref 11–307)

## 2024-04-17 LAB — MAGNESIUM: Magnesium: 1.8 mg/dL (ref 1.7–2.4)

## 2024-04-17 LAB — CBC
HCT: 31.7 % — ABNORMAL LOW (ref 36.0–46.0)
Hemoglobin: 9.9 g/dL — ABNORMAL LOW (ref 12.0–15.0)
MCH: 25.8 pg — ABNORMAL LOW (ref 26.0–34.0)
MCHC: 31.2 g/dL (ref 30.0–36.0)
MCV: 82.6 fL (ref 80.0–100.0)
Platelets: 196 10*3/uL (ref 150–400)
RBC: 3.84 MIL/uL — ABNORMAL LOW (ref 3.87–5.11)
RDW: 22.8 % — ABNORMAL HIGH (ref 11.5–15.5)
WBC: 6.7 10*3/uL (ref 4.0–10.5)
nRBC: 0 % (ref 0.0–0.2)

## 2024-04-17 LAB — TYPE AND SCREEN
ABO/RH(D): O POS
Antibody Screen: NEGATIVE

## 2024-04-17 MED ORDER — IRON SUCROSE 500 MG IVPB - SIMPLE MED
500.0000 mg | INTRAVENOUS | Status: DC
Start: 2024-04-17 — End: 2024-04-17

## 2024-04-17 MED ORDER — SODIUM CHLORIDE 0.9 % IV SOLN
INTRAVENOUS | Status: AC | PRN
Start: 2024-04-17 — End: 2024-04-18

## 2024-04-17 MED ORDER — METHYLPREDNISOLONE SODIUM SUCC 125 MG IJ SOLR: 125.0000 mg | Freq: Once | INTRAMUSCULAR | Status: DC | PRN

## 2024-04-17 MED ORDER — PANTOPRAZOLE SODIUM 40 MG PO TBEC: 40.0000 mg | DELAYED_RELEASE_TABLET | ORAL | Status: DC | PRN

## 2024-04-17 MED ORDER — SODIUM CHLORIDE 0.9 % IV SOLN
500.0000 mg | Freq: Once | INTRAVENOUS | Status: AC
  Administered 2024-04-17: 500 mg via INTRAVENOUS
  Filled 2024-04-17: qty 25

## 2024-04-17 MED ORDER — DIPHENHYDRAMINE HCL 50 MG/ML IJ SOLN: 25.0000 mg | Freq: Once | INTRAMUSCULAR | Status: DC | PRN

## 2024-04-17 MED ORDER — ALBUTEROL SULFATE (2.5 MG/3ML) 0.083% IN NEBU
2.5000 mg | INHALATION_SOLUTION | Freq: Once | RESPIRATORY_TRACT | Status: DC | PRN
Start: 2024-04-17 — End: 2024-04-20

## 2024-04-17 MED ORDER — SODIUM CHLORIDE 0.9 % IV BOLUS
500.0000 mL | Freq: Once | INTRAVENOUS | Status: DC | PRN
Start: 2024-04-17 — End: 2024-04-20

## 2024-04-17 MED ORDER — SODIUM CHLORIDE 0.9 % IV SOLN: 500.0000 mg | Freq: Once | INTRAVENOUS | Status: DC

## 2024-04-17 MED ORDER — PANTOPRAZOLE SODIUM 40 MG PO TBEC: 40.0000 mg | DELAYED_RELEASE_TABLET | Freq: Every day | ORAL | Status: DC

## 2024-04-17 MED ORDER — EPINEPHRINE 0.3 MG/0.3ML IJ SOAJ: 0.3000 mg | Freq: Once | INTRAMUSCULAR | Status: DC | PRN

## 2024-04-17 NOTE — Plan of Care (Signed)
  Problem: Health Behavior/Discharge Planning: Goal: Ability to manage health-related needs will improve Outcome: Progressing   Problem: Clinical Measurements: Goal: Ability to maintain clinical measurements within normal limits will improve Outcome: Progressing Goal: Will remain free from infection Outcome: Progressing Goal: Diagnostic test results will improve Outcome: Progressing   Problem: Coping: Goal: Level of anxiety will decrease Outcome: Progressing   Problem: Pain Managment: Goal: General experience of comfort will improve and/or be controlled Outcome: Progressing   Problem: Safety: Goal: Ability to remain free from injury will improve Outcome: Progressing   Problem: Skin Integrity: Goal: Risk for impaired skin integrity will decrease Outcome: Progressing   Problem: Education: Goal: Knowledge of disease or condition will improve Outcome: Progressing Goal: Knowledge of the prescribed therapeutic regimen will improve Outcome: Progressing Goal: Individualized Educational Video(s) Outcome: Progressing   Problem: Clinical Measurements: Goal: Complications related to the disease process, condition or treatment will be avoided or minimized Outcome: Progressing   Problem: Education: Goal: Knowledge of disease or condition will improve Outcome: Progressing Goal: Knowledge of the prescribed therapeutic regimen will improve Outcome: Progressing   Problem: Fluid Volume: Goal: Peripheral tissue perfusion will improve Outcome: Progressing   Problem: Clinical Measurements: Goal: Complications related to disease process, condition or treatment will be avoided or minimized Outcome: Progressing

## 2024-04-17 NOTE — Progress Notes (Addendum)
 Patient ID: Beth [name], female   DOB: 03-Jul-1986, 38 y.o.   MRN: [id number] Pt reports feeling stronger today but slightly queasy. Just took pepcid - waiting for it to work.  She denies HA, ctxs, pelvic pressure, LOF. She is appreciating FMs  Today's Vitals   04/17/24 0018 04/17/24 0351 04/17/24 0620 04/17/24 0829  BP: (!) 120/52  126/64 131/79  Pulse: 79  75 72  Resp:    18  Temp: (P) 98 F (36.7 C)   98.3 F (36.8 C)  TempSrc:    Oral  SpO2:    100%  Weight:      Height:      PainSc:  0-No pain  0-No pain   Body mass index is 49.65 kg/m.  GEN - NAD ABD - Monitors in place, soft, CW GA EXT - no homans, no significant edema   EFM - 144,moderate variability, no decels, + accels TOCO - no contractions SVE - deferred  US  04/15/24:  SIUP, vertex, LGA >99% 6lbs 13oz , nl afi, post plac, 8/8 BPP  Mg - 1.8 ( nl)  Ca - 10.7 ( high)   04/16/24: Hg 9.5   A/P: 38 yo G4P3 @ [redacted]w[redacted]d HD#24 admitted for PreE w/ SF   1) Preeclampsia: Current regimen: Labetalol  600 TID Patient has a history of severe headaches with Procardia  therefore labetalol  was the medication of choice for antihypertensive.  Labetalol  was titrated to maximum dose on 04/02/24, required slight decrease but is now stable at above regimen - Twice weekly BPP (8 out of 8 on 6/16, last 6/20), LGA noted - Continue inpatient management through delivery - Given severe preeclampsia, recommend delivery at 34 weeks, IOL scheduled for 6/25 pending NICU bed availability  ( pt prefers delivery at [facility] vs transfer out)  - Status post course of betamethasone  on 5/29 and 5/30.  Plan rescue course of betamethasone  at 33+5 (6/23) and 33+6 (6/24), or earlier if delivery is indicated sooner.   2) Primary hyperparathyroidism with associated electrolyte abnormalities: Internal medicine and endocrine following. General surgery consulted, recommend specific imaging studies not generally completed during pregnancy, will likely defer surgical  management until postpartum if patient is responsive to medical treatment. *OB team to reach out to Dr [name] on surgery team in the postpartum period.      -hypercalcemia: Ca 10.7 on 6/21, muscle weakness resolved.  IVF increased and calcitonin as needed per Medicine / endocrinology.  Endocrinology recommends restarting calcitonin twice daily for 48 hours.       -hypokalemia: likely linked with hyperparathyroidism per Endo. Potassium still currently in normal range on potassium 40 mEq TID.       -hypomagnesemia: also likely linked with hyperparathyroidism per Endo. Mag 1.8 today, on magnesium  oxide 400 mg 4 times daily   3) Anemia of pregnancy: Status post IV iron  x 2.  First infusion on 5/30, second infusion 6/6.  Improved to 11.0 on 04/07/24, back down to 9.9 on 04/11/24, now 9.5.  Could be dilutional due to IVF.  Recommend additional IV iron  today given pending delivery likely this week: venofer  500mg  today and again in 4 days ( order in place)   4) LGA - anticipate adequate for TOLAC given proven pelvis to 10lbs   5) History of persistent headache: Has been having persistent headaches since early in the pregnancy.  Saw neurology in consultation on the day of admission for persistent headache.  Now resolved.  Status post normal MRI of brain on hospital day #4. Tylenol  and  Fioricet  as needed   6) Routine prenatal care/ MOD: q72 hour T&S. Next due 04/20/24. Desires VBAC. History of 2 prior successful vaginal deliveries 1 of which was a VBAC.  Pelvis proven to 10 pounds.  Baby vertex per US  on 04/15/24  - Check with NICU to coordinate delivery planning as approach 04/20/24 - currently scheduled for IOL on this date - Protonix for nausea ordered prn need

## 2024-04-17 NOTE — Progress Notes (Signed)
 PROGRESS NOTE    Beth [name]  FMW:[id number] DOB: 07/31/1986 DOA: 03/24/2024 PCP: Patient, No Pcp Per   Brief Narrative: Beth [name] is a 38 y.o. female  G4P3 at 36 weeks past medical history significant for anxiety,  previous history of hypothyroidism, family history of renal potassium wasting, patient was admitted 5/29 with preeclampsia by OB.  We  were consulted 6/01 for hypokalemia.  She was treated with magnesium  and potassium supplements.  Her potassium has now remained in a good range.  Blood pressure has been better controlled.  Plan is to proceed with delivery at 34 weeks.  Patient has developed hypercalcemia.  We were consulted 6/14  to assist with management.  Calcium  level has been ranging around 10.5----has been slowly increasing since 6/09 ---to 11----subsequently 13.6--- Calcium  correction by albumin 14.2     Assessment & Plan:  Hypercalcemia Primary hyperparathyroidism PTH: 46, PTH like peptide pending, Vitamin D : 61, TSH. 0.6 Dr. Faythe Endocrinologist on board, patient appears to have primary hyperparathyroidism, 24-hour urine calcium  excludes familial and acquired hypocalciuric hypercalcemia Completed calcitonin, IV fluids General Surgery consulted, plan for parathyroid  surgery postdelivery (scheduled for induction on 6/25) Continue to monitor calcium  levels   Preeclampsia Per OB  Left Ventricle Hypertrophy on EKG; ECHO Ef 55% to 60 % There is mild concentric left ventricular hypertrophy.   Hypomagnesemia Replete IV as needed, continue p.o. magnesium  oxide  Estimated body mass index is 49.65 kg/m as calculated from the following:   Height as of this encounter: 5' 11 (1.803 m).   Weight as of this encounter: 161.5 kg.  Subjective: Felt a little nauseous, with some heartburn, OB gave her Pepcid .  Starting to feel better.  Met her kids in the room, 2 boys and 1 girl.  Objective: Vitals:   04/17/24 0018 04/17/24 0620 04/17/24 0829 04/17/24 1129  BP: (!)  120/52 126/64 131/79 (!) 140/70  Pulse: 79 75 72 81  Resp:   18 18  Temp: (P) 98 F (36.7 C)  98.3 F (36.8 C) 97.9 F (36.6 C)  TempSrc:   Oral Oral  SpO2:   100% 100%  Weight:      Height:        Intake/Output Summary (Last 24 hours) at 04/17/2024 1739 Last data filed at 04/16/2024 1813 Gross per 24 hour  Intake 187.5 ml  Output --  Net 187.5 ml    Filed Weights   03/24/24 1417  Weight: (!) 161.5 kg    Examination: General: NAD  Cardiovascular: S1, S2 present Respiratory: CTAB Musculoskeletal: Trace bilateral pedal edema noted Skin: Normal Psychiatry: Normal mood    Data Reviewed: I have personally reviewed following labs and imaging studies  CBC: Recent Labs  Lab 04/13/24 0617 04/15/24 0546 04/17/24 1116  WBC 6.3 7.0 6.7  HGB 9.3* 9.5* 9.9*  HCT 30.2* 30.5* 31.7*  MCV 80.5 81.1 82.6  PLT 186 191 196   Basic Metabolic Panel: Recent Labs  Lab 04/13/24 0617 04/13/24 1651 04/14/24 0558 04/15/24 0546 04/16/24 0428 04/16/24 0958 04/17/24 0539  NA 135 136 138 137  --  137 135  K 3.7 4.3 3.9 3.8  --  3.9 3.8  CL 109 111 110 110  --  111 111  CO2 19* 19* 21* 19*  --  18* 19*  GLUCOSE 91 94 98 93  --  124* 92  BUN <5* <5* <5* <5*  --  <5* <5*  CREATININE 0.61 0.67 0.51 0.61  --  0.67 0.59  CALCIUM  9.9 11.1*  10.7* 12.3*  --  10.1 10.7*  MG 1.7  --  1.6* 1.5* 1.6*  --  1.8   GFR: Estimated Creatinine Clearance: 164.4 mL/min (by C-G formula based on SCr of 0.59 mg/dL). Liver Function Tests: Recent Labs  Lab 04/11/24 0434 04/12/24 1652 04/14/24 0558  AST 16 20 16   ALT 11 13 12   ALKPHOS 70 71 63  BILITOT 0.5 0.8 0.6  PROT 5.4* 6.0* 5.3*  ALBUMIN 2.4* 2.7* 2.3*   No results for input(s): LIPASE, AMYLASE in the last 168 hours. No results for input(s): AMMONIA in the last 168 hours. Coagulation Profile: No results for input(s): INR, PROTIME in the last 168 hours. Cardiac Enzymes: No results for input(s): CKTOTAL, CKMB, CKMBINDEX,  TROPONINI in the last 168 hours. BNP (last 3 results) No results for input(s): PROBNP in the last 8760 hours. HbA1C: No results for input(s): HGBA1C in the last 72 hours. CBG: No results for input(s): GLUCAP in the last 168 hours.  Lipid Profile: No results for input(s): CHOL, HDL, LDLCALC, TRIG, CHOLHDL, LDLDIRECT in the last 72 hours. Thyroid  Function Tests: No results for input(s): TSH, T4TOTAL, FREET4, T3FREE, THYROIDAB in the last 72 hours.  Anemia Panel: Recent Labs    04/17/24 1116  FERRITIN 47   Sepsis Labs: No results for input(s): PROCALCITON, LATICACIDVEN in the last 168 hours.  No results found for this or any previous visit (from the past 240 hours).       Radiology Studies: No results found.        Scheduled Meds:  docusate sodium   100 mg Oral Daily   famotidine   20 mg Oral Q12H   ferrous sulfate   325 mg Oral Q48H   labetalol   600 mg Oral Q8H   magnesium  oxide  400 mg Oral QID   potassium chloride   40 mEq Oral TID   prenatal multivitamin  1 tablet Oral Q1200   sodium chloride  flush  3 mL Intravenous Q12H   Continuous Infusions:  sodium chloride  Stopped (04/16/24 1130)   sodium chloride      [START ON 04/21/2024] iron  sucrose     sodium chloride        LOS: 24 days     Lebron JINNY Cage, MD [facility]   If 7PM-7AM, please contact night-coverage [email or web address]  04/17/2024, 5:39 PM

## 2024-04-17 NOTE — Progress Notes (Signed)
 Subjective: Beth [name] feels better today overall.  She had some nausea without vomiting today.  Her generalized weakness resolved.  She had some transient discomfort from calcitonin injections, more so with intramuscular other than subcutaneous.  She continues to drink plenty of water, including 3 full servings from her water bottle today.  She also continues to walk around the unit and outdoors including an outdoor seating area next to the parking deck.  She is accompanied by her husband and 3 children today.  Objective: Vital signs in last 24 hours: Temp:  [97.9 F (36.6 C)-98.3 F (36.8 C)] 97.9 F (36.6 C) (06/22 1129) Pulse Rate:  [71-81] 81 (06/22 1129) Resp:  [18] 18 (06/22 1129) BP: (120-144)/(52-79) 140/70 (06/22 1129) SpO2:  [100 %] 100 % (06/22 1129) Weight change:  Last BM Date : 04/09/24  Intake/Output from previous day: 06/21 0701 - 06/22 0700 In: 8714.1 [I.V.:8691.4; IV Piggyback:22.7] Out: -  Intake/Output this shift: No intake/output data recorded.   Physical exam: General: No apparent distress, conversant. Cardiovascular: Regular rhythm and rate, normal radial pulse. Mental status exam: Appropriate mood and affect.   Lab Results: Recent Labs    04/15/24 0546 04/17/24 1116  WBC 7.0 6.7  HGB 9.5* 9.9*  HCT 30.5* 31.7*  PLT 191 196   BMET Recent Labs    04/16/24 0958 04/17/24 0539  NA 137 135  K 3.9 3.8  CL 111 111  CO2 18* 19*  GLUCOSE 124* 92  BUN <5* <5*  CREATININE 0.67 0.59  CALCIUM  10.1 10.7*    Studies/Results: No results found.  Medications: I have reviewed the patient's current medications. Scheduled:  docusate sodium   100 mg Oral Daily   famotidine   20 mg Oral Q12H   ferrous sulfate   325 mg Oral Q48H   labetalol   600 mg Oral Q8H   magnesium  oxide  400 mg Oral QID   potassium chloride   40 mEq Oral TID   prenatal multivitamin  1 tablet Oral Q1200   sodium chloride  flush  3 mL Intravenous Q12H   Continuous:  sodium chloride   Stopped (04/16/24 1130)   sodium chloride      [START ON 04/21/2024] iron  sucrose     sodium chloride       Assessment/Plan: 1. Primary hyperparathyroidism with hypercalcemia and hypercalciuria, currently pregnant in third trimester of pregnancy.   Her calcium  improved but remains high after 4 additional doses of calcitonin.  We again discussed weightbearing activity, hydration, and resuming calcitonin if hypercalcemia worsens (if symptomatic again).  She is understandably eager to have parathyroid  surgery after pregnancy in hopes of resolving this problem.   LOS: 24 days   Tian Mcmurtrey 04/17/2024, 4:48 PM

## 2024-04-18 LAB — BASIC METABOLIC PANEL WITH GFR
Anion gap: 7 (ref 5–15)
BUN: <5 mg/dL — ABNORMAL LOW (ref 6–20)
CO2: 21 mmol/L — ABNORMAL LOW (ref 22–32)
Calcium: 13.7 mg/dL (ref 8.9–10.3)
Chloride: 109 mmol/L (ref 98–111)
Creatinine, Ser: 0.68 mg/dL (ref 0.44–1.00)
GFR, Estimated: >60 mL/min (ref >60)
Glucose, Bld: 92 mg/dL (ref 70–99)
Potassium: 3.9 mmol/L (ref 3.5–5.1)
Sodium: 137 mmol/L (ref 135–145)

## 2024-04-18 LAB — CBC WITH DIFFERENTIAL/PLATELET
Abs Immature Granulocytes: 0.08 10*3/uL — ABNORMAL HIGH (ref 0.00–0.07)
Basophils Absolute: 0 10*3/uL (ref 0.0–0.1)
Basophils Relative: 0 %
Eosinophils Absolute: 0.2 10*3/uL (ref 0.0–0.5)
Eosinophils Relative: 3 %
HCT: 32.1 % — ABNORMAL LOW (ref 36.0–46.0)
Hemoglobin: 10.2 g/dL — ABNORMAL LOW (ref 12.0–15.0)
Immature Granulocytes: 1 %
Lymphocytes Relative: 17 %
Lymphs Abs: 1.3 10*3/uL (ref 0.7–4.0)
MCH: 26.1 pg (ref 26.0–34.0)
MCHC: 31.8 g/dL (ref 30.0–36.0)
MCV: 82.1 fL (ref 80.0–100.0)
Monocytes Absolute: 0.9 10*3/uL (ref 0.1–1.0)
Monocytes Relative: 11 %
Neutro Abs: 5.5 10*3/uL (ref 1.7–7.7)
Neutrophils Relative %: 68 %
Platelets: 204 10*3/uL (ref 150–400)
RBC: 3.91 MIL/uL (ref 3.87–5.11)
RDW: 23 % — ABNORMAL HIGH (ref 11.5–15.5)
WBC: 8 10*3/uL (ref 4.0–10.5)
nRBC: 0 % (ref 0.0–0.2)

## 2024-04-18 LAB — MAGNESIUM: Magnesium: 1.6 mg/dL — ABNORMAL LOW (ref 1.7–2.4)

## 2024-04-18 LAB — ALBUMIN: Albumin: 2.4 g/dL — ABNORMAL LOW (ref 3.5–5.0)

## 2024-04-18 MED ORDER — MAGNESIUM SULFATE 2 GM/50ML IV SOLN
2.0000 g | Freq: Once | INTRAVENOUS | Status: AC
  Administered 2024-04-18: 2 g via INTRAVENOUS
  Filled 2024-04-18: qty 50

## 2024-04-18 MED ORDER — CALCITONIN (SALMON) 200 UNIT/ML IJ SOLN
400.0000 [IU] | Freq: Two times a day (BID) | INTRAMUSCULAR | Status: AC
  Administered 2024-04-18 – 2024-04-19 (×3): 400 [IU] via SUBCUTANEOUS
  Filled 2024-04-18 (×4): qty 2

## 2024-04-18 MED ORDER — LACTATED RINGERS IV SOLN: INTRAVENOUS | Status: DC

## 2024-04-18 MED ORDER — BETAMETHASONE SOD PHOS & ACET 6 (3-3) MG/ML IJ SUSP
12.0000 mg | INTRAMUSCULAR | Status: AC
  Administered 2024-04-18 – 2024-04-19 (×2): 12 mg via INTRAMUSCULAR
  Filled 2024-04-18 (×2): qty 5

## 2024-04-18 NOTE — Progress Notes (Signed)
 Subjective: Beth [name] feels out of sorts today.  No pain, but feels dragging or tired.  She continues to drink water.  She notes that her intravenous fluid was held since our visit yesterday.  She continues to walk or stand for weightbearing exercise.  No other concerns at this time.  Objective: Vital signs in last 24 hours: Temp:  [97.9 F (36.6 C)-98.4 F (36.9 C)] 98.1 F (36.7 C) (06/23 1202) Pulse Rate:  [64-86] 82 (06/23 1202) Resp:  [17-18] 17 (06/23 1202) BP: (120-150)/(69-89) 120/89 (06/23 1202) SpO2:  [97 %-100 %] 100 % (06/23 1202) Weight change:  Last BM Date : 04/09/24  Intake/Output from previous day: 06/22 0701 - 06/23 0700 In: 276.6 [IV Piggyback:276.6] Out: -  Intake/Output this shift: No intake/output data recorded.  Physical exam: General: No apparent distress, conversant, alert. Cardiovascular: Regular rhythm and rate, normal radial pulse. Lungs: Clear to auscultation.  No crackles. Mental status exam: Appropriate mood and affect.   Lab Results: Recent Labs    04/17/24 1116 04/18/24 0455  WBC 6.7 8.0  HGB 9.9* 10.2*  HCT 31.7* 32.1*  PLT 196 204   BMET Recent Labs    04/17/24 0539 04/18/24 0455  NA 135 137  K 3.8 3.9  CL 111 109  CO2 19* 21*  GLUCOSE 92 92  BUN <5* <5*  CREATININE 0.59 0.68  CALCIUM  10.7* 13.7*    Studies/Results: No results found.  Medications: I have reviewed the patient's current medications. Scheduled:  betamethasone  acetate-betamethasone  sodium phosphate   12 mg Intramuscular Q24 Hr x 2   calcitonin  400 Units Subcutaneous BID   docusate sodium   100 mg Oral Daily   famotidine   20 mg Oral Q12H   ferrous sulfate   325 mg Oral Q48H   labetalol   600 mg Oral Q8H   magnesium  oxide  400 mg Oral QID   potassium chloride   40 mEq Oral TID   prenatal multivitamin  1 tablet Oral Q1200   sodium chloride  flush  3 mL Intravenous Q12H   Continuous:  sodium chloride  Stopped (04/16/24 1130)   [START ON 04/21/2024]  iron  sucrose     sodium chloride       Assessment/Plan: 1. Primary hyperparathyroidism with hypercalcemia and hypercalciuria, affecting the third trimester pregnancy. 2. History of hypokalemia.  Last week, I provided the patient a handout from the [employer]  [employer] on thyroid  nodules and a handout from the [employer] on primary hyperparathyroidism.  I addressed her questions and concerns after she had a chance to read the handouts.  We again discussed anticipated benefits, nature of therapy, and risks of calcitonin, including but not limited to pain at injection site, hypocalcemia, etc. from calcitonin.  She agreed to resume calcitonin at this time.  I also plan to resume or continue her intravenous normal saline at 125 mL/h.  She agreed to continue with weightbearing exercise as much as possible including standing or walking.  She also will continue to drink water.  As previously noted, her hypercalcemia increases her baby's risk of neonatal hypocalcemia.  Her child's neonatologist should be made aware.   LOS: 25 days   Beth [name] 04/18/2024, 12:39 PM

## 2024-04-18 NOTE — Progress Notes (Signed)
 PROGRESS NOTE    Beth [name]  FMW:[id number] DOB: Feb 18, 1986 DOA: 03/24/2024 PCP: Patient, No Pcp Per   Brief Narrative: Beth [name] is a 38 y.o. female  G4P3 at 63 weeks past medical history significant for anxiety,  previous history of hypothyroidism, family history of renal potassium wasting, patient was admitted 5/29 with preeclampsia by OB.  We  were consulted 6/01 for hypokalemia.  She was treated with magnesium  and potassium supplements.  Her potassium has now remained in a good range.  Blood pressure has been better controlled.  Plan is to proceed with delivery at 34 weeks.  Patient has developed hypercalcemia.  We were consulted 6/14  to assist with management.  Calcium  level has been ranging around 10.5----has been slowly increasing since 6/09 ---to 11----subsequently 13.6--- Calcium  correction by albumin 14.2     Assessment & Plan:  Hypercalcemia Primary hyperparathyroidism PTH: 46, PTH like peptide pending, Vitamin D : 61, TSH. 0.6 Dr. Faythe Endocrinologist on board, patient appears to have primary hyperparathyroidism, 24-hour urine calcium  excludes familial and acquired hypocalciuric hypercalcemia Calcitonin, IV fluids General Surgery consulted, plan for parathyroid  surgery postdelivery (scheduled for induction on 6/25) Continue to monitor calcium  levels   Preeclampsia Per OB  Left Ventricle Hypertrophy on EKG; ECHO Ef 55% to 60 % There is mild concentric left ventricular hypertrophy.   Hypomagnesemia Replete IV as needed, continue p.o. magnesium  oxide  Estimated body mass index is 49.65 kg/m as calculated from the following:   Height as of this encounter: 5' 11 (1.803 m).   Weight as of this encounter: 161.5 kg.  Subjective: Felt a bit weak this a.m., but subsequently improved throughout the day.    Objective: Vitals:   04/18/24 0628 04/18/24 1017 04/18/24 1202 04/18/24 1542  BP: 125/80 (!) 150/74 120/89 130/70  Pulse: 86 80 82 83  Resp: 18 18 17 18    Temp: 97.9 F (36.6 C) 98.4 F (36.9 C) 98.1 F (36.7 C) 98 F (36.7 C)  TempSrc: Oral Oral Oral Oral  SpO2:  100% 100% 100%  Weight:      Height:        Intake/Output Summary (Last 24 hours) at 04/18/2024 1637 Last data filed at 04/17/2024 1833 Gross per 24 hour  Intake 276.6 ml  Output --  Net 276.6 ml    Filed Weights   03/24/24 1417  Weight: (!) 161.5 kg    Examination: General: NAD  Cardiovascular: S1, S2 present Respiratory: CTAB Musculoskeletal: Trace bilateral pedal edema noted Skin: Normal Psychiatry: Normal mood    Data Reviewed: I have personally reviewed following labs and imaging studies  CBC: Recent Labs  Lab 04/13/24 0617 04/15/24 0546 04/17/24 1116 04/18/24 0455  WBC 6.3 7.0 6.7 8.0  NEUTROABS  --   --   --  5.5  HGB 9.3* 9.5* 9.9* 10.2*  HCT 30.2* 30.5* 31.7* 32.1*  MCV 80.5 81.1 82.6 82.1  PLT 186 191 196 204   Basic Metabolic Panel: Recent Labs  Lab 04/14/24 0558 04/15/24 0546 04/16/24 0428 04/16/24 0958 04/17/24 0539 04/18/24 0455  NA 138 137  --  137 135 137  K 3.9 3.8  --  3.9 3.8 3.9  CL 110 110  --  111 111 109  CO2 21* 19*  --  18* 19* 21*  GLUCOSE 98 93  --  124* 92 92  BUN <5* <5*  --  <5* <5* <5*  CREATININE 0.51 0.61  --  0.67 0.59 0.68  CALCIUM  10.7* 12.3*  --  10.1  10.7* 13.7*  MG 1.6* 1.5* 1.6*  --  1.8 1.6*   GFR: Estimated Creatinine Clearance: 164.4 mL/min (by C-G formula based on SCr of 0.68 mg/dL). Liver Function Tests: Recent Labs  Lab 04/12/24 1652 04/14/24 0558 04/18/24 0455  AST 20 16  --   ALT 13 12  --   ALKPHOS 71 63  --   BILITOT 0.8 0.6  --   PROT 6.0* 5.3*  --   ALBUMIN 2.7* 2.3* 2.4*   No results for input(s): LIPASE, AMYLASE in the last 168 hours. No results for input(s): AMMONIA in the last 168 hours. Coagulation Profile: No results for input(s): INR, PROTIME in the last 168 hours. Cardiac Enzymes: No results for input(s): CKTOTAL, CKMB, CKMBINDEX, TROPONINI in  the last 168 hours. BNP (last 3 results) No results for input(s): PROBNP in the last 8760 hours. HbA1C: No results for input(s): HGBA1C in the last 72 hours. CBG: No results for input(s): GLUCAP in the last 168 hours.  Lipid Profile: No results for input(s): CHOL, HDL, LDLCALC, TRIG, CHOLHDL, LDLDIRECT in the last 72 hours. Thyroid  Function Tests: No results for input(s): TSH, T4TOTAL, FREET4, T3FREE, THYROIDAB in the last 72 hours.  Anemia Panel: Recent Labs    04/17/24 1116  FERRITIN 47   Sepsis Labs: No results for input(s): PROCALCITON, LATICACIDVEN in the last 168 hours.  No results found for this or any previous visit (from the past 240 hours).       Radiology Studies: No results found.        Scheduled Meds:  betamethasone  acetate-betamethasone  sodium phosphate   12 mg Intramuscular Q24 Hr x 2   calcitonin  400 Units Subcutaneous BID   docusate sodium   100 mg Oral Daily   famotidine   20 mg Oral Q12H   ferrous sulfate   325 mg Oral Q48H   labetalol   600 mg Oral Q8H   magnesium  oxide  400 mg Oral QID   potassium chloride   40 mEq Oral TID   prenatal multivitamin  1 tablet Oral Q1200   sodium chloride  flush  3 mL Intravenous Q12H   Continuous Infusions:  sodium chloride  Stopped (04/16/24 1130)   [START ON 04/21/2024] iron  sucrose     lactated ringers      sodium chloride        LOS: 25 days     Lebron JINNY Cage, MD [facility]   If 7PM-7AM, please contact night-coverage [email or web address]  04/18/2024, 4:37 PM

## 2024-04-18 NOTE — Progress Notes (Signed)
 Patient ID: Beth [name], female   DOB: 29-Nov-1985, 38 y.o.   MRN: [id number] Pt reports feeling  okay today, feeling a little weak but attributes this to not eating breakfast yet this morning.  She denies HA, ctxs, pelvic pressure, LOF. She is appreciating FMs  Today's Vitals   04/17/24 2236 04/17/24 2324 04/18/24 0628 04/18/24 1017  BP: 137/69  125/80 (!) 150/74  Pulse: 74  86 80  Resp: 18  18 18   Temp: 98 F (36.7 C)  97.9 F (36.6 C) 98.4 F (36.9 C)  TempSrc: Oral  Oral Oral  SpO2:    100%  Weight:      Height:      PainSc:  0-No pain     Body mass index is 49.65 kg/m.  GEN - NAD ABD - Monitors in place, soft, CW GA EXT - no homans, no significant edema   EFM - 125bpm, moderate variability, no decels, + accels TOCO - no contractions SVE - deferred  US  04/15/24:  SIUP, vertex, LGA >99% 6lbs 13oz , nl afi, post plac, 8/8 BPP     Latest Ref Rng & Units 04/18/2024    4:55 AM 04/17/2024   11:16 AM 04/15/2024    5:46 AM  CBC  WBC 4.0 - 10.5 K/uL 8.0  6.7  7.0   Hemoglobin 12.0 - 15.0 g/dL 89.7  9.9  9.5   Hematocrit 36.0 - 46.0 % 32.1  31.7  30.5   Platelets 150 - 400 K/uL 204  196  191         Latest Ref Rng & Units 04/18/2024    4:55 AM 04/17/2024    5:39 AM 04/16/2024    9:58 AM  CMP  Glucose 70 - 99 mg/dL 92  92  875   BUN 6 - 20 mg/dL <5  <5  <5   Creatinine 0.44 - 1.00 mg/dL 9.31  9.40  9.32   Sodium 135 - 145 mmol/L 137  135  137   Potassium 3.5 - 5.1 mmol/L 3.9  3.8  3.9   Chloride 98 - 111 mmol/L 109  111  111   CO2 22 - 32 mmol/L 21  19  18    Calcium  8.9 - 10.3 mg/dL 86.2  89.2  89.8       A/P: 38 yo G4P3 @ [redacted]w[redacted]d HD#24 admitted for PreE w/ SF   1) Preeclampsia: Current regimen: Labetalol  600 TID Patient has a history of severe headaches with Procardia  therefore labetalol  was the medication of choice for antihypertensive.  Labetalol  was titrated to maximum dose on 04/02/24, required slight decrease but is now stable at above regimen - Twice weekly BPP  (8 out of 8 on 6/16, last 6/20), LGA noted - Continue inpatient management through delivery - Given severe preeclampsia, recommend delivery at 34 weeks, IOL scheduled for 6/25 pending NICU bed availability  ( pt prefers delivery at [facility] vs transfer out)  - Status post course of betamethasone  on 5/29 and 5/30.  Plan rescue course of betamethasone  at 33+5 (6/23) and 33+6 (6/24)   2) Primary hyperparathyroidism with associated electrolyte abnormalities: Internal medicine and endocrine following. General surgery consulted, recommend specific imaging studies not generally completed during pregnancy, will likely defer surgical management until postpartum if patient is responsive to medical treatment. *OB team to reach out to Dr [name] on surgery team in the postpartum period.      -hypercalcemia: Ca increased today at 13.7, awaiting medicine/endocrine recommendations for management       -  hypokalemia: likely linked with hyperparathyroidism per Endo. Potassium still currently in normal range on potassium 40 mEq TID.       -hypomagnesemia: also likely linked with hyperparathyroidism per Endo. Mag 1.8 today, on magnesium  oxide 400 mg 4 times daily   3) Anemia of pregnancy: Status post IV iron  x 3.  First infusion on 5/30, second infusion 6/6, third infusion 6/22 (yesterday). Order is in place for repeat infusion in 4 days if still pregnant  4) LGA - anticipate adequate for TOLAC given proven pelvis to 10lbs   5) History of persistent headache: Has been having persistent headaches since early in the pregnancy.  Saw neurology in consultation on the day of admission for persistent headache.  Now resolved.  Status post normal MRI of brain on hospital day #4. Tylenol  and Fioricet  as needed   6) Routine prenatal care/ MOD: q72 hour T&S. Next due 04/20/24. Desires VBAC. History of 2 prior successful vaginal deliveries 1 of which was a VBAC.  Pelvis proven to 10 pounds.  Baby vertex per US  on 04/15/24  - Check  with NICU to coordinate delivery planning as approach 04/20/24 - currently scheduled for IOL on this date - Protonix for nausea ordered prn need  M. Diedre, MD 04/18/24 10:32 AM

## 2024-04-19 LAB — COMPREHENSIVE METABOLIC PANEL WITH GFR
ALT: 12 U/L (ref 0–44)
AST: 16 U/L (ref 15–41)
Albumin: 2.5 g/dL — ABNORMAL LOW (ref 3.5–5.0)
Alkaline Phosphatase: 74 U/L (ref 38–126)
Anion gap: 7 (ref 5–15)
BUN: 5 mg/dL — ABNORMAL LOW (ref 6–20)
CO2: 19 mmol/L — ABNORMAL LOW (ref 22–32)
Calcium: 12.1 mg/dL — ABNORMAL HIGH (ref 8.9–10.3)
Chloride: 110 mmol/L (ref 98–111)
Creatinine, Ser: 0.7 mg/dL (ref 0.44–1.00)
GFR, Estimated: >60 mL/min (ref >60)
Glucose, Bld: 124 mg/dL — ABNORMAL HIGH (ref 70–99)
Potassium: 4.4 mmol/L (ref 3.5–5.1)
Sodium: 136 mmol/L (ref 135–145)
Total Bilirubin: 0.7 mg/dL (ref 0.0–1.2)
Total Protein: 5.7 g/dL — ABNORMAL LOW (ref 6.5–8.1)

## 2024-04-19 LAB — MAGNESIUM: Magnesium: 1.6 mg/dL — ABNORMAL LOW (ref 1.7–2.4)

## 2024-04-19 MED ORDER — MAGNESIUM SULFATE 4 GM/100ML IV SOLN
4.0000 g | Freq: Once | INTRAVENOUS | Status: AC
  Administered 2024-04-19: 4 g via INTRAVENOUS
  Filled 2024-04-19: qty 100

## 2024-04-19 MED ORDER — CALCITONIN (SALMON) 200 UNIT/ML IJ SOLN
400.0000 [IU] | Freq: Once | INTRAMUSCULAR | Status: AC
  Administered 2024-04-19: 400 [IU] via SUBCUTANEOUS
  Filled 2024-04-19: qty 2

## 2024-04-19 NOTE — Progress Notes (Signed)
 Subjective: Beth [name] reports feeling well today.  I feel fine today.  Her generalized muscle weakness resolved.  She continues to drink plenty of water.  She continues with some weightbearing activity though the hot weather outside has limited this somewhat.  She had brief mild discomfort for about 10 seconds after her subcutaneous calcitonin injection.  Objective: Vital signs in last 24 hours: Temp:  [98 F (36.7 C)-98.6 F (37 C)] 98.1 F (36.7 C) (06/24 0739) Pulse Rate:  [76-83] 76 (06/24 0739) Resp:  [16-18] 17 (06/24 0739) BP: (121-142)/(59-76) 129/63 (06/24 0739) SpO2:  [97 %-100 %] 97 % (06/24 0739) Weight change:  Last BM Date : 04/09/24  Intake/Output from previous day: 06/23 0701 - 06/24 0700 In: 4 [I.V.:4] Out: -  Intake/Output this shift: No intake/output data recorded.  Physical exam: General: No apparent distress, conversant. Cardiovascular: Normal radial pulse, regular rhythm and rate. Pulmonary: Clear to auscultation.  Lab Results: Recent Labs    04/17/24 1116 04/18/24 0455  WBC 6.7 8.0  HGB 9.9* 10.2*  HCT 31.7* 32.1*  PLT 196 204   BMET Recent Labs    04/18/24 0455 04/19/24 0526  NA 137 136  K 3.9 4.4  CL 109 110  CO2 21* 19*  GLUCOSE 92 124*  BUN <5* 5*  CREATININE 0.68 0.70  CALCIUM  13.7* 12.1*    Studies/Results: No results found.  Medications: Scheduled:  calcitonin  400 Units Subcutaneous BID   docusate sodium   100 mg Oral Daily   famotidine   20 mg Oral Q12H   ferrous sulfate   325 mg Oral Q48H   labetalol   600 mg Oral Q8H   magnesium  oxide  400 mg Oral QID   potassium chloride   40 mEq Oral TID   prenatal multivitamin  1 tablet Oral Q1200   sodium chloride  flush  3 mL Intravenous Q12H   Continuous:  sodium chloride  Stopped (04/16/24 1130)   [START ON 04/21/2024] iron  sucrose     lactated ringers  125 mL/hr at 04/19/24 9366   sodium chloride       Assessment/Plan: #1 primary hyperparathyroidism with hypercalcemia and  hypercalciuria affecting third trimester pregnancy.   #2 history of hypokalemia.  We again discussed how hydration, weightbearing activity, and medicines may help with the hypercalcemia.  We discussed how medicines to treat hypercalcemia, including calcitonin, do not have extensive use during pregnancy.  Generally, bisphosphonates, denosumab, and Cinacalcet are not recommended during pregnancy.  She agreed with continuing calcitonin for now.  She anticipates induction of labor as early as tomorrow, but this may be delayed by a day or two since the neonatal intensive care unit is full.    We again discussed how calcitonin may have side effects including pain at the injection site.  We again discussed how hypercalcemia during pregnancy may increase her baby's chance of neonatal hypocalcemia.     LOS: 26 days   Beth [name] 04/19/2024, 12:40 PM

## 2024-04-19 NOTE — Progress Notes (Signed)
 Patient ID: Beth [name], female   DOB: Sep 24, 1986, 38 y.o.   MRN: [id number] Pt reports feeling  well today.  Some burning with magnesium  in IV, but otherwise just worried about IOL and the full NICU  She denies HA, ctxs, pelvic pressure, LOF. She is appreciating FMs  Today's Vitals   04/18/24 2330 04/19/24 0300 04/19/24 0739 04/19/24 0825  BP: (!) 142/76 (!) 121/59 129/63   Pulse: 77  76   Resp: 16 16 17    Temp: 98.6 F (37 C)  98.1 F (36.7 C)   TempSrc: Oral  Oral   SpO2: 98%  97%   Weight:      Height:      PainSc:    0-No pain   Body mass index is 49.65 kg/m.  GEN - NAD ABD - soft, gravid EXT - no homans, no significant edema   EFM - pending from today TOCO - no contractions SVE - deferred  US  04/15/24:  SIUP, vertex, LGA >99% 6lbs 13oz , nl afi, post plac, 8/8 BPP     Latest Ref Rng & Units 04/18/2024    4:55 AM 04/17/2024   11:16 AM 04/15/2024    5:46 AM  CBC  WBC 4.0 - 10.5 K/uL 8.0  6.7  7.0   Hemoglobin 12.0 - 15.0 g/dL 89.7  9.9  9.5   Hematocrit 36.0 - 46.0 % 32.1  31.7  30.5   Platelets 150 - 400 K/uL 204  196  191         Latest Ref Rng & Units 04/19/2024    5:26 AM 04/18/2024    4:55 AM 04/17/2024    5:39 AM  CMP  Glucose 70 - 99 mg/dL 875  92  92   BUN 6 - 20 mg/dL 5  <5  <5   Creatinine 0.44 - 1.00 mg/dL 9.29  9.31  9.40   Sodium 135 - 145 mmol/L 136  137  135   Potassium 3.5 - 5.1 mmol/L 4.4  3.9  3.8   Chloride 98 - 111 mmol/L 110  109  111   CO2 22 - 32 mmol/L 19  21  19    Calcium  8.9 - 10.3 mg/dL 87.8  86.2  89.2   Total Protein 6.5 - 8.1 g/dL 5.7     Total Bilirubin 0.0 - 1.2 mg/dL 0.7     Alkaline Phos 38 - 126 U/L 74     AST 15 - 41 U/L 16     ALT 0 - 44 U/L 12         A/P: 38 yo G4P3 @ [redacted]w[redacted]d HD#25 admitted for PreE w/ SF   1) Preeclampsia: Current regimen: Labetalol  600 TID Patient has a history of severe headaches with Procardia  therefore labetalol  was the medication of choice for antihypertensive.  Labetalol  was titrated to  maximum dose on 04/02/24, required slight decrease but is now stable at above regimen - Twice weekly BPP (8 out of 8 on 6/16, last 6/20), LGA noted.  Will order for today - Continue inpatient management through delivery - Given severe preeclampsia, recommend delivery at 34 weeks, IOL scheduled for 6/25 pending NICU bed availability  ( pt prefers delivery at [facility] vs transfer out)  - Status post course of betamethasone  on 5/29 and 5/30.  Plan rescue course of betamethasone  at 33+5 (6/23) and 33+6 (6/24)   2) Primary hyperparathyroidism with associated electrolyte abnormalities: Internal medicine and endocrine following. General surgery consulted, recommend specific imaging studies not generally completed  during pregnancy, will likely defer surgical management until postpartum if patient is responsive to medical treatment. *OB team to reach out to Dr [name] on surgery team in the postpartum period.      -hypercalcemia: Ca down to 12.1 today, awaiting medicine/endocrine recommendations for management       -hypokalemia: likely linked with hyperparathyroidism per Endo. Potassium still currently in normal range on potassium 40 mEq TID.       -hypomagnesemia: also likely linked with hyperparathyroidism per Endo. Mag 1.6 today, on magnesium  oxide 400 mg 4 times daily   3) Anemia of pregnancy: Status post IV iron  x 3.  First infusion on 5/30, second infusion 6/6, third infusion 6/22. Order is in place for repeat infusion in 4 days if still pregnant  4) LGA - anticipate adequate for TOLAC given proven pelvis to 10lbs   5) History of persistent headache: Has been having persistent headaches since early in the pregnancy.  Saw neurology in consultation on the day of admission for persistent headache.  Now resolved.  Status post normal MRI of brain on hospital day #4. Tylenol  and Fioricet  as needed   6) Routine prenatal care/ MOD: q72 hour T&S. Next due 04/20/24. Desires VBAC. History of 2 prior successful  vaginal deliveries 1 of which was a VBAC.  Pelvis proven to 10 pounds.  Baby vertex per US  on 04/15/24  - Check with NICU to coordinate delivery planning as approach 04/20/24 - currently scheduled for IOL on this date.  NICU today is full.  We discussed that typically severe preeclampsia is not expectantly managed past 34 weeks.  She is anxious about transfer to a different facility given the other complications to her admission (hypercalcemia and other electrolyte abnormalities).  In addition, [place] and she is worried about baby being in a NICU for multiple weeks outside of [place].  We discussed that with shared decision making, if her BPs remain stable, it may not be unreasonable to delay delivery for a few days in hopes of being able to deliver here in [place].  She understands that worsening hypertension would be a contraindication to this plan.    EMERSON Chapel, MD 04/19/24 11:34 AM

## 2024-04-19 NOTE — Progress Notes (Signed)
 PROGRESS NOTE    Beth [name]  FMW:[id number] DOB: 1986-02-12 DOA: 03/24/2024 PCP: Patient, No Pcp Per   Brief Narrative: Beth [name] is a 38 y.o. female  G4P3 at 43 weeks past medical history significant for anxiety,  previous history of hypothyroidism, family history of renal potassium wasting, patient was admitted 5/29 with preeclampsia by OB.  We  were consulted 6/01 for hypokalemia.  She was treated with magnesium  and potassium supplements.  Her potassium has now remained in a good range.  Blood pressure has been better controlled.  Plan is to proceed with delivery at 34 weeks.  Patient has developed hypercalcemia.  We were consulted 6/14  to assist with management.  Calcium  level has been ranging around 10.5----has been slowly increasing since 6/09 ---to 11----subsequently 13.6--- Calcium  correction by albumin 14.2     Assessment & Plan:  Hypercalcemia Primary hyperparathyroidism PTH: 46, PTH like peptide pending, Vitamin D : 61, TSH. 0.6 Dr. Faythe Endocrinologist on board, patient appears to have primary hyperparathyroidism, 24-hour urine calcium  excludes familial and acquired hypocalciuric hypercalcemia Calcitonin, IV fluids General Surgery consulted, plan for parathyroid  surgery postdelivery (scheduled for induction on 6/25 pending bed availability in NICU, currently NICU is full) Continue to monitor calcium  levels   Preeclampsia Per OB  Left Ventricle Hypertrophy on EKG; ECHO Ef 55% to 60 % There is mild concentric left ventricular hypertrophy.   Hypomagnesemia Replete IV as needed, continue p.o. magnesium  oxide  Estimated body mass index is 49.65 kg/m as calculated from the following:   Height as of this encounter: 5' 11 (1.803 m).   Weight as of this encounter: 161.5 kg.  Subjective: Denies any new complaints today    Objective: Vitals:   04/19/24 0300 04/19/24 0739 04/19/24 1310 04/19/24 1607  BP: (!) 121/59 129/63 (!) 152/74 139/68  Pulse:  76 96 85  Resp:  16 17 17 18   Temp:  98.1 F (36.7 C) 98 F (36.7 C) 97.8 F (36.6 C)  TempSrc:  Oral Oral Oral  SpO2:  97% 100% 99%  Weight:      Height:        Intake/Output Summary (Last 24 hours) at 04/19/2024 1930 Last data filed at 04/19/2024 1510 Gross per 24 hour  Intake 2432.73 ml  Output --  Net 2432.73 ml    Filed Weights   03/24/24 1417  Weight: (!) 161.5 kg    Examination: General: NAD  Cardiovascular: S1, S2 present Respiratory: CTAB Musculoskeletal: Trace bilateral pedal edema noted Skin: Normal Psychiatry: Normal mood    Data Reviewed: I have personally reviewed following labs and imaging studies  CBC: Recent Labs  Lab 04/13/24 0617 04/15/24 0546 04/17/24 1116 04/18/24 0455  WBC 6.3 7.0 6.7 8.0  NEUTROABS  --   --   --  5.5  HGB 9.3* 9.5* 9.9* 10.2*  HCT 30.2* 30.5* 31.7* 32.1*  MCV 80.5 81.1 82.6 82.1  PLT 186 191 196 204   Basic Metabolic Panel: Recent Labs  Lab 04/15/24 0546 04/16/24 0428 04/16/24 0958 04/17/24 0539 04/18/24 0455 04/19/24 0526  NA 137  --  137 135 137 136  K 3.8  --  3.9 3.8 3.9 4.4  CL 110  --  111 111 109 110  CO2 19*  --  18* 19* 21* 19*  GLUCOSE 93  --  124* 92 92 124*  BUN <5*  --  <5* <5* <5* 5*  CREATININE 0.61  --  0.67 0.59 0.68 0.70  CALCIUM  12.3*  --  10.1 10.7*  13.7* 12.1*  MG 1.5* 1.6*  --  1.8 1.6* 1.6*   GFR: Estimated Creatinine Clearance: 164.4 mL/min (by C-G formula based on SCr of 0.7 mg/dL). Liver Function Tests: Recent Labs  Lab 04/14/24 0558 04/18/24 0455 04/19/24 0526  AST 16  --  16  ALT 12  --  12  ALKPHOS 63  --  74  BILITOT 0.6  --  0.7  PROT 5.3*  --  5.7*  ALBUMIN 2.3* 2.4* 2.5*   No results for input(s): LIPASE, AMYLASE in the last 168 hours. No results for input(s): AMMONIA in the last 168 hours. Coagulation Profile: No results for input(s): INR, PROTIME in the last 168 hours. Cardiac Enzymes: No results for input(s): CKTOTAL, CKMB, CKMBINDEX, TROPONINI in the last  168 hours. BNP (last 3 results) No results for input(s): PROBNP in the last 8760 hours. HbA1C: No results for input(s): HGBA1C in the last 72 hours. CBG: No results for input(s): GLUCAP in the last 168 hours.  Lipid Profile: No results for input(s): CHOL, HDL, LDLCALC, TRIG, CHOLHDL, LDLDIRECT in the last 72 hours. Thyroid  Function Tests: No results for input(s): TSH, T4TOTAL, FREET4, T3FREE, THYROIDAB in the last 72 hours.  Anemia Panel: Recent Labs    04/17/24 1116  FERRITIN 47   Sepsis Labs: No results for input(s): PROCALCITON, LATICACIDVEN in the last 168 hours.  No results found for this or any previous visit (from the past 240 hours).       Radiology Studies: No results found.        Scheduled Meds:  calcitonin  400 Units Subcutaneous BID   docusate sodium   100 mg Oral Daily   famotidine   20 mg Oral Q12H   ferrous sulfate   325 mg Oral Q48H   labetalol   600 mg Oral Q8H   magnesium  oxide  400 mg Oral QID   potassium chloride   40 mEq Oral TID   prenatal multivitamin  1 tablet Oral Q1200   sodium chloride  flush  3 mL Intravenous Q12H   Continuous Infusions:  sodium chloride  Stopped (04/16/24 1130)   [START ON 04/21/2024] iron  sucrose     lactated ringers  125 mL/hr at 04/19/24 1741   sodium chloride        LOS: 26 days     Lebron JINNY Cage, MD [facility]   If 7PM-7AM, please contact night-coverage [email or web address]  04/19/2024, 7:30 PM

## 2024-04-20 ENCOUNTER — Encounter (HOSPITAL_COMMUNITY): Payer: Self-pay | Admitting: Obstetrics and Gynecology

## 2024-04-20 ENCOUNTER — Inpatient Hospital Stay (HOSPITAL_COMMUNITY): Attending: Student

## 2024-04-20 ENCOUNTER — Inpatient Hospital Stay (HOSPITAL_COMMUNITY): Admission: RE | Admit: 2024-04-20 | Source: Home / Self Care | Admitting: Student

## 2024-04-20 ENCOUNTER — Other Ambulatory Visit: Payer: Self-pay

## 2024-04-20 ENCOUNTER — Inpatient Hospital Stay (HOSPITAL_COMMUNITY): Admitting: Anesthesiology

## 2024-04-20 ENCOUNTER — Encounter (HOSPITAL_COMMUNITY)
Admission: AD | Disposition: A | Discharge status: Home / Self Care | Payer: Self-pay | Source: Home / Self Care | Attending: Obstetrics and Gynecology

## 2024-04-20 DIAGNOSIS — O1414 Severe pre-eclampsia complicating childbirth: Secondary | ICD-10-CM

## 2024-04-20 DIAGNOSIS — O34211 Maternal care for low transverse scar from previous cesarean delivery: Secondary | ICD-10-CM

## 2024-04-20 DIAGNOSIS — O329XX0 Maternal care for malpresentation of fetus, unspecified, not applicable or unspecified: Secondary | ICD-10-CM

## 2024-04-20 DIAGNOSIS — Z3A34 34 weeks gestation of pregnancy: Secondary | ICD-10-CM

## 2024-04-20 LAB — COMPREHENSIVE METABOLIC PANEL WITH GFR
ALT: 12 U/L (ref 0–44)
AST: 22 U/L (ref 15–41)
Albumin: 2.8 g/dL — ABNORMAL LOW (ref 3.5–5.0)
Alkaline Phosphatase: 80 U/L (ref 38–126)
Anion gap: 6 (ref 5–15)
BUN: 6 mg/dL (ref 6–20)
CO2: 22 mmol/L (ref 22–32)
Calcium: 12.9 mg/dL — ABNORMAL HIGH (ref 8.9–10.3)
Chloride: 109 mmol/L (ref 98–111)
Creatinine, Ser: 0.9 mg/dL (ref 0.44–1.00)
GFR, Estimated: >60 mL/min (ref >60)
Glucose, Bld: 124 mg/dL — ABNORMAL HIGH (ref 70–99)
Potassium: 4.2 mmol/L (ref 3.5–5.1)
Sodium: 137 mmol/L (ref 135–145)
Total Bilirubin: 0.6 mg/dL (ref 0.0–1.2)
Total Protein: 6.4 g/dL — ABNORMAL LOW (ref 6.5–8.1)

## 2024-04-20 LAB — CREATININE, SERUM
Creatinine, Ser: 0.88 mg/dL (ref 0.44–1.00)
GFR, Estimated: >60 mL/min (ref >60)

## 2024-04-20 LAB — CBC
HCT: 31.2 % — ABNORMAL LOW (ref 36.0–46.0)
Hemoglobin: 9.8 g/dL — ABNORMAL LOW (ref 12.0–15.0)
MCH: 25.9 pg — ABNORMAL LOW (ref 26.0–34.0)
MCHC: 31.4 g/dL (ref 30.0–36.0)
MCV: 82.5 fL (ref 80.0–100.0)
Platelets: 220 10*3/uL (ref 150–400)
RBC: 3.78 MIL/uL — ABNORMAL LOW (ref 3.87–5.11)
RDW: 23.3 % — ABNORMAL HIGH (ref 11.5–15.5)
WBC: 13.3 10*3/uL — ABNORMAL HIGH (ref 4.0–10.5)
nRBC: 0 % (ref 0.0–0.2)

## 2024-04-20 LAB — MAGNESIUM: Magnesium: 1.6 mg/dL — ABNORMAL LOW (ref 1.7–2.4)

## 2024-04-20 SURGERY — Surgical Case
Anesthesia: Spinal

## 2024-04-20 MED ORDER — ACETAMINOPHEN 500 MG PO TABS
1000.0000 mg | ORAL_TABLET | Freq: Four times a day (QID) | ORAL | Status: DC
  Administered 2024-04-20 – 2024-04-27 (×26): 1000 mg via ORAL
  Filled 2024-04-20 (×27): qty 2

## 2024-04-20 MED ORDER — EPHEDRINE SULFATE-NACL 50-0.9 MG/10ML-% IV SOSY
PREFILLED_SYRINGE | INTRAVENOUS | Status: DC | PRN
  Administered 2024-04-20: 5 mg via INTRAVENOUS

## 2024-04-20 MED ORDER — TERBUTALINE SULFATE 1 MG/ML IJ SOLN: 0.2500 mg | Freq: Once | INTRAMUSCULAR | Status: DC | PRN

## 2024-04-20 MED ORDER — MORPHINE SULFATE (PF) 0.5 MG/ML IJ SOLN
INTRAMUSCULAR | Status: AC
Start: 2024-04-20 — End: 2024-04-20
  Filled 2024-04-20: qty 10

## 2024-04-20 MED ORDER — IBUPROFEN 600 MG PO TABS
600.0000 mg | ORAL_TABLET | Freq: Four times a day (QID) | ORAL | Status: DC
  Administered 2024-04-21 – 2024-04-27 (×22): 600 mg via ORAL
  Filled 2024-04-20 (×23): qty 1

## 2024-04-20 MED ORDER — MAGNESIUM SULFATE 40 GM/1000ML IV SOLN
2.0000 g/h | INTRAVENOUS | Status: AC
  Administered 2024-04-20 – 2024-04-21 (×2): 2 g/h via INTRAVENOUS
  Filled 2024-04-20 (×2): qty 1000

## 2024-04-20 MED ORDER — ENOXAPARIN SODIUM 40 MG/0.4ML IJ SOSY
40.0000 mg | PREFILLED_SYRINGE | Freq: Two times a day (BID) | INTRAMUSCULAR | Status: DC
  Administered 2024-04-21 – 2024-04-23 (×6): 40 mg via SUBCUTANEOUS
  Filled 2024-04-20 (×7): qty 0.4

## 2024-04-20 MED ORDER — CEFAZOLIN SODIUM-DEXTROSE 3-4 GM/150ML-% IV SOLN
INTRAVENOUS | Status: AC
  Filled 2024-04-20: qty 150

## 2024-04-20 MED ORDER — ACETAMINOPHEN 10 MG/ML IV SOLN
INTRAVENOUS | Status: DC | PRN
  Administered 2024-04-20: 1000 mg via INTRAVENOUS

## 2024-04-20 MED ORDER — TRANEXAMIC ACID 1000 MG/10ML IV SOLN
INTRAVENOUS | Status: DC | PRN
  Administered 2024-04-20: 1000 mg via INTRAVENOUS

## 2024-04-20 MED ORDER — OXYTOCIN-SODIUM CHLORIDE 30-0.9 UT/500ML-% IV SOLN
INTRAVENOUS | Status: AC
Start: 2024-04-20 — End: 2024-04-20
  Filled 2024-04-20: qty 500

## 2024-04-20 MED ORDER — ZOLPIDEM TARTRATE 5 MG PO TABS: 5.0000 mg | ORAL_TABLET | Freq: Every evening | ORAL | Status: DC | PRN

## 2024-04-20 MED ORDER — LABETALOL HCL 5 MG/ML IV SOLN: 80.0000 mg | INTRAVENOUS | Status: DC | PRN

## 2024-04-20 MED ORDER — MENTHOL 3 MG MT LOZG: 1.0000 | LOZENGE | OROMUCOSAL | Status: DC | PRN

## 2024-04-20 MED ORDER — ONDANSETRON HCL 4 MG/2ML IJ SOLN
INTRAMUSCULAR | Status: DC | PRN
  Administered 2024-04-20: 4 mg via INTRAVENOUS

## 2024-04-20 MED ORDER — SIMETHICONE 80 MG PO CHEW: 80.0000 mg | CHEWABLE_TABLET | ORAL | Status: DC | PRN

## 2024-04-20 MED ORDER — COCONUT OIL OIL
1.0000 | TOPICAL_OIL | Status: DC | PRN
  Administered 2024-04-23: 1 via TOPICAL

## 2024-04-20 MED ORDER — DEXAMETHASONE SODIUM PHOSPHATE 10 MG/ML IJ SOLN
INTRAMUSCULAR | Status: DC | PRN
  Administered 2024-04-20: 10 mg via INTRAVENOUS

## 2024-04-20 MED ORDER — EPHEDRINE 5 MG/ML INJ
INTRAVENOUS | Status: AC
  Filled 2024-04-20: qty 5

## 2024-04-20 MED ORDER — FENTANYL CITRATE (PF) 100 MCG/2ML IJ SOLN
INTRAMUSCULAR | Status: AC
  Filled 2024-04-20: qty 2

## 2024-04-20 MED ORDER — TRANEXAMIC ACID-NACL 1000-0.7 MG/100ML-% IV SOLN
INTRAVENOUS | Status: AC
  Filled 2024-04-20: qty 100

## 2024-04-20 MED ORDER — HYDRALAZINE HCL 20 MG/ML IJ SOLN: 10.0000 mg | INTRAMUSCULAR | Status: DC | PRN

## 2024-04-20 MED ORDER — ONDANSETRON HCL 4 MG/2ML IJ SOLN
INTRAMUSCULAR | Status: AC
  Filled 2024-04-20: qty 2

## 2024-04-20 MED ORDER — CEFAZOLIN SODIUM-DEXTROSE 3-4 GM/150ML-% IV SOLN
3.0000 g | INTRAVENOUS | Status: AC
  Administered 2024-04-20: 3 g via INTRAVENOUS
  Filled 2024-04-20: qty 150

## 2024-04-20 MED ORDER — SIMETHICONE 80 MG PO CHEW
80.0000 mg | CHEWABLE_TABLET | Freq: Three times a day (TID) | ORAL | Status: DC
  Administered 2024-04-20 – 2024-04-27 (×20): 80 mg via ORAL
  Filled 2024-04-20 (×20): qty 1

## 2024-04-20 MED ORDER — FENTANYL CITRATE (PF) 100 MCG/2ML IJ SOLN
INTRAMUSCULAR | Status: DC | PRN
  Administered 2024-04-20: 15 ug via INTRATHECAL

## 2024-04-20 MED ORDER — OXYTOCIN-SODIUM CHLORIDE 30-0.9 UT/500ML-% IV SOLN: 1.0000 m[IU]/min | INTRAVENOUS | Status: DC

## 2024-04-20 MED ORDER — STERILE WATER FOR IRRIGATION IR SOLN
Status: DC | PRN
  Administered 2024-04-20: 1000 mL

## 2024-04-20 MED ORDER — KETOROLAC TROMETHAMINE 30 MG/ML IJ SOLN
30.0000 mg | Freq: Four times a day (QID) | INTRAMUSCULAR | Status: AC
  Administered 2024-04-20 – 2024-04-21 (×4): 30 mg via INTRAVENOUS
  Filled 2024-04-20 (×4): qty 1

## 2024-04-20 MED ORDER — LABETALOL HCL 5 MG/ML IV SOLN: 20.0000 mg | INTRAVENOUS | Status: DC | PRN

## 2024-04-20 MED ORDER — OXYTOCIN-SODIUM CHLORIDE 30-0.9 UT/500ML-% IV SOLN
INTRAVENOUS | Status: DC | PRN
Start: 2024-04-20 — End: 2024-04-20
  Administered 2024-04-20: 300 mL via INTRAVENOUS

## 2024-04-20 MED ORDER — MAGNESIUM SULFATE BOLUS VIA INFUSION
4.0000 g | Freq: Once | INTRAVENOUS | Status: AC
  Administered 2024-04-20: 4 g via INTRAVENOUS
  Filled 2024-04-20: qty 1000

## 2024-04-20 MED ORDER — LACTATED RINGERS IV SOLN: INTRAVENOUS | Status: DC | PRN

## 2024-04-20 MED ORDER — LABETALOL HCL 5 MG/ML IV SOLN: 40.0000 mg | INTRAVENOUS | Status: DC | PRN

## 2024-04-20 MED ORDER — DIPHENHYDRAMINE HCL 50 MG/ML IJ SOLN
25.0000 mg | Freq: Four times a day (QID) | INTRAMUSCULAR | Status: DC | PRN
  Administered 2024-04-20: 25 mg via INTRAVENOUS
  Filled 2024-04-20: qty 1

## 2024-04-20 MED ORDER — PHENYLEPHRINE HCL-NACL 20-0.9 MG/250ML-% IV SOLN
INTRAVENOUS | Status: AC
Start: 2024-04-20 — End: 2024-04-20
  Filled 2024-04-20: qty 250

## 2024-04-20 MED ORDER — SENNOSIDES-DOCUSATE SODIUM 8.6-50 MG PO TABS
2.0000 | ORAL_TABLET | Freq: Every day | ORAL | Status: DC
  Administered 2024-04-21 – 2024-04-27 (×7): 2 via ORAL
  Filled 2024-04-20 (×7): qty 2

## 2024-04-20 MED ORDER — BUPIVACAINE IN DEXTROSE 0.75-8.25 % IT SOLN
INTRATHECAL | Status: DC | PRN
  Administered 2024-04-20: 1.65 mL via INTRATHECAL

## 2024-04-20 MED ORDER — PRENATAL MULTIVITAMIN CH
1.0000 | ORAL_TABLET | Freq: Every day | ORAL | Status: DC
  Administered 2024-04-20 – 2024-04-27 (×7): 1 via ORAL
  Filled 2024-04-20 (×7): qty 1

## 2024-04-20 MED ORDER — WITCH HAZEL-GLYCERIN EX PADS: 1.0000 | MEDICATED_PAD | CUTANEOUS | Status: DC | PRN

## 2024-04-20 MED ORDER — OXYCODONE HCL 5 MG PO TABS
5.0000 mg | ORAL_TABLET | ORAL | Status: DC | PRN
  Administered 2024-04-22 – 2024-04-24 (×4): 5 mg via ORAL
  Administered 2024-04-25 – 2024-04-27 (×3): 10 mg via ORAL
  Filled 2024-04-20: qty 1
  Filled 2024-04-20: qty 2
  Filled 2024-04-20: qty 1
  Filled 2024-04-20: qty 2
  Filled 2024-04-20 (×2): qty 1
  Filled 2024-04-20: qty 2

## 2024-04-20 MED ORDER — PHENYLEPHRINE HCL-NACL 20-0.9 MG/250ML-% IV SOLN
INTRAVENOUS | Status: DC | PRN
  Administered 2024-04-20: 60 ug/min via INTRAVENOUS

## 2024-04-20 MED ORDER — SOD CITRATE-CITRIC ACID 500-334 MG/5ML PO SOLN
30.0000 mL | Freq: Once | ORAL | Status: AC
  Administered 2024-04-20: 30 mL via ORAL
  Filled 2024-04-20: qty 30

## 2024-04-20 MED ORDER — DIBUCAINE (PERIANAL) 1 % EX OINT
1.0000 | TOPICAL_OINTMENT | CUTANEOUS | Status: AC | PRN
Start: 2024-04-20 — End: ?

## 2024-04-20 MED ORDER — OXYTOCIN-SODIUM CHLORIDE 30-0.9 UT/500ML-% IV SOLN: 2.5000 [IU]/h | INTRAVENOUS | Status: AC

## 2024-04-20 MED ORDER — DIPHENHYDRAMINE HCL 25 MG PO CAPS
25.0000 mg | ORAL_CAPSULE | Freq: Four times a day (QID) | ORAL | Status: DC | PRN
  Administered 2024-04-20: 25 mg via ORAL
  Filled 2024-04-20: qty 1

## 2024-04-20 MED ORDER — MORPHINE SULFATE (PF) 0.5 MG/ML IJ SOLN
INTRAMUSCULAR | Status: DC | PRN
  Administered 2024-04-20: 150 ug via INTRATHECAL

## 2024-04-20 SURGICAL SUPPLY — 36 items
BENZOIN TINCTURE PRP APPL 2/3 (GAUZE/BANDAGES/DRESSINGS) IMPLANT
CANISTER WOUND CARE 500ML ATS (WOUND CARE) IMPLANT
CHLORAPREP W/TINT 26 (MISCELLANEOUS) ×2 IMPLANT
CLAMP UMBILICAL CORD (MISCELLANEOUS) ×1 IMPLANT
CLOTH BEACON ORANGE TIMEOUT ST (SAFETY) ×1 IMPLANT
DERMABOND ADVANCED .7 DNX12 (GAUZE/BANDAGES/DRESSINGS) ×1 IMPLANT
DRESSING PREVENA PLUS CUSTOM (GAUZE/BANDAGES/DRESSINGS) IMPLANT
DRSG OPSITE POSTOP 4X10 (GAUZE/BANDAGES/DRESSINGS) ×1 IMPLANT
ELECTRODE REM PT RTRN 9FT ADLT (ELECTROSURGICAL) ×1 IMPLANT
EXTRACTOR VACUUM BELL STYLE (SUCTIONS) IMPLANT
GAUZE SPONGE 4X4 12PLY STRL LF (GAUZE/BANDAGES/DRESSINGS) IMPLANT
GLOVE BIOGEL PI IND STRL 6.5 (GLOVE) ×1 IMPLANT
GLOVE BIOGEL PI IND STRL 7.0 (GLOVE) ×2 IMPLANT
GLOVE SURG SS PI 6.0 STRL IVOR (GLOVE) ×1 IMPLANT
GOWN STRL REUS W/TWL LRG LVL3 (GOWN DISPOSABLE) ×1 IMPLANT
HEMOSTAT ARISTA ABSORB 3G PWDR (HEMOSTASIS) IMPLANT
KIT ABG SYR 3ML LUER SLIP (SYRINGE) IMPLANT
LIGASURE IMPACT 36 18CM CVD LR (INSTRUMENTS) ×1 IMPLANT
MAT PREVALON FULL STRYKER (MISCELLANEOUS) IMPLANT
NDL HYPO 25X5/8 SAFETYGLIDE (NEEDLE) IMPLANT
NEEDLE HYPO 25X5/8 SAFETYGLIDE (NEEDLE) IMPLANT
NS IRRIG 1000ML POUR BTL (IV SOLUTION) ×1 IMPLANT
PACK C SECTION WH (CUSTOM PROCEDURE TRAY) ×1 IMPLANT
PAD ABD 8X10 STRL (GAUZE/BANDAGES/DRESSINGS) IMPLANT
PAD OB MATERNITY 4.3X12.25 (PERSONAL CARE ITEMS) ×1 IMPLANT
RETRACTOR TRAXI PANNICULUS (MISCELLANEOUS) IMPLANT
RTRCTR C-SECT PINK 25CM LRG (MISCELLANEOUS) IMPLANT
STRIP CLOSURE SKIN 1/2X4 (GAUZE/BANDAGES/DRESSINGS) IMPLANT
SUT PDS AB 0 CTX 36 PDP370T (SUTURE) IMPLANT
SUT PLAIN ABS 2-0 CT1 27XMFL (SUTURE) ×1 IMPLANT
SUT VIC AB 0 CTX36XBRD ANBCTRL (SUTURE) ×4 IMPLANT
SUT VIC AB 2-0 CT1 TAPERPNT 27 (SUTURE) ×2 IMPLANT
SUT VIC AB 4-0 KS 27 (SUTURE) ×1 IMPLANT
TOWEL OR 17X24 6PK STRL BLUE (TOWEL DISPOSABLE) ×2 IMPLANT
TRAY FOLEY W/BAG SLVR 14FR LF (SET/KITS/TRAYS/PACK) ×1 IMPLANT
WATER STERILE IRR 1000ML POUR (IV SOLUTION) ×1 IMPLANT

## 2024-04-20 NOTE — Progress Notes (Signed)
 Subjective: Beth [name] notes that she is recovering rather well after her C-section earlier today.  Before her surgery, she felt well, no fatigue, polydipsia, polyuria, or other concerning symptoms for hypercalcemia.  She notes that her newborns is wonderful.  She will be breast-feeding her son.  Objective: Vital signs in last 24 hours: Temp:  [97.4 F (36.3 C)-98.2 F (36.8 C)] 98.1 F (36.7 C) (06/25 1600) Pulse Rate:  [44-85] 64 (06/25 1626) Resp:  [14-21] 17 (06/25 1600) BP: (122-149)/(53-100) 128/60 (06/25 1626) SpO2:  [96 %-100 %] 100 % (06/25 1600) Weight change:  Last BM Date : 04/09/24  Intake/Output from previous day: 06/24 0701 - 06/25 0700 In: 3721.2 [I.V.:3621.2; IV Piggyback:100] Out: -  Intake/Output this shift: Total I/O In: 4331.5 [P.O.:2520; I.V.:1811.5] Out: 2435 [Urine:1725; Blood:710]  General appearance: alert and no distress  Lab Results: Recent Labs    04/18/24 0455 04/20/24 0831  WBC 8.0 13.3*  HGB 10.2* 9.8*  HCT 32.1* 31.2*  PLT 204 220   BMET Recent Labs    04/19/24 0526 04/20/24 0558 04/20/24 1319  NA 136 137  --   K 4.4 4.2  --   CL 110 109  --   CO2 19* 22  --   GLUCOSE 124* 124*  --   BUN 5* 6  --   CREATININE 0.70 0.90 0.88  CALCIUM  12.1* 12.9*  --     Studies/Results: No results found.  Medications: Scheduled:  acetaminophen   1,000 mg Oral Q6H   [START ON 04/21/2024] enoxaparin (LOVENOX) injection  40 mg Subcutaneous Q12H   ketorolac   30 mg Intravenous Q6H   Followed by   NOREEN ON 04/21/2024] ibuprofen   600 mg Oral Q6H   labetalol   600 mg Oral Q8H   potassium chloride   40 mEq Oral TID   prenatal multivitamin  1 tablet Oral Q1200   [START ON 04/21/2024] senna-docusate  2 tablet Oral Daily   simethicone   80 mg Oral TID PC   Continuous:  sodium chloride  Stopped (04/16/24 1130)   magnesium  sulfate 2 g/hr (04/20/24 1142)   oxytocin       Assessment/Plan: #1 Primary hyperparathyroidism with hypercalcemia and  hypercalciuria, now postpartum. #2.History of kidney stones.  We discussed the limitations with medicines for hypercalcemia during lactation.    As she has observed, calcitonin therapy tends to work well at first but the effectiveness is limited by tachyphylaxis (rapidly diminishing response to successive doses).  Calcitonin is a normal component of human milk where may play a role in development of enteric neurons in the breastfed infant.  Because it is a large peptide, absorption by the infant is unlikely because it is probably destroyed in the infant's gastrointestinal tract.  She agreed to consider calcitonin therapy again if her hypercalcemia becomes symptomatic before her parathyroid  surgery.  Cinacalcet may be used to treat hypercalcemia from primary hyperparathyroidism.  Cinacalcet is excreted in the milk of lactating rats.  If calcitonin is required by the mother, it is not a necessarily reason to discontinue breastfeeding--according to Drugs and Lactation Database (LactMed).  However with one case report of Cinacalcet use in third trimester of pregnancy, the patient discarded pumped breastmilk up until 2 weeks after discontinuing the medication.  The authors noted, there is no data on whether it is transferred to human breast milk.      LOS: 27 days   Beth [name] 04/20/2024, 6:13 PM

## 2024-04-20 NOTE — Anesthesia Procedure Notes (Signed)
 Spinal  Patient location during procedure: OR Start time: 04/20/2024 9:53 AM End time: 04/20/2024 9:58 AM Reason for block: surgical anesthesia Staffing Anesthesiologist: Mallory Manus, MD Performed by: Mallory Manus, MD Authorized by: Mallory Manus, MD   Preanesthetic Checklist Completed: patient identified, IV checked, site marked, risks and benefits discussed, surgical consent, monitors and equipment checked, pre-op evaluation and timeout performed Spinal Block Patient position: sitting Prep: DuraPrep Patient monitoring: heart rate, cardiac monitor, continuous pulse ox and blood pressure Approach: midline Location: L3-4 Injection technique: single-shot Needle Needle type: Sprotte  Needle gauge: 24 G Needle length: 9 cm Assessment Sensory level: T4 Events: CSF return

## 2024-04-20 NOTE — Progress Notes (Signed)
 PROGRESS NOTE    Beth [name]  FMW:[id number] DOB: [birth year] DOA: 03/24/2024 PCP: Patient, No Pcp Per   Brief Narrative:  This 38 yrs old female G4P3 at [redacted] weeks GA with PMH significant for anxiety,  previous history of hypothyroidism, family history of renal potassium wasting, Patient was admitted on 5/29 with preeclampsia by OB.  We were consulted on 6/01 for hypokalemia.  She was treated with magnesium  and potassium supplements.  Her potassium has now remained in a good range.  Blood pressure has been better controlled.  Plan is to proceed with delivery at 34 weeks.  Patient has developed hypercalcemia.  We were consulted on 6/14  to assist with management. Calcium  level has been ranging around 10.5----has been slowly increasing since 6/09 ---to 11----subsequently 13.6--- Calcium  correction by albumin 14.2 Patient s/p C section today.   Assessment & Plan:   Active Problems:   * No active hospital problems. *  Hypercalcemia: Primary hyperparathyroidism: PTH: 46, PTH like peptide pending, Vitamin D : 61, TSH. 0.6 Dr. Faythe Endocrinologist on board, Patient appears to have primary hyperparathyroidism,  24-hour urine calcium  excludes familial and acquired hypocalciuric hypercalcemia. Continue Calcitonin, IV fluids. General Surgery consulted, plan for parathyroid  surgery postdelivery (scheduled for induction on 6/25 pending bed availability in NICU, currently NICU is full) Continue to monitor calcium  levels.   Preeclampsia: Status post repeat  Low transverse C-section without extension. Treatment as per OB/GYN.   Left Ventricle Hypertrophy on EKG; ECHO Ef 55% to 60 % There is mild concentric left ventricular hypertrophy.    Hypomagnesemia Replete IV as needed, continue p.o. magnesium  oxide.   Estimated body mass index is 49.65 kg/m as calculated from the following:   Height as of this encounter: 5' 11 (1.803 m).   Weight as of this encounter: 161.5 kg.   Procedures: C  Section.  Antimicrobials:  Anti-infectives (From admission, onward)    Start     Dose/Rate Route Frequency Ordered Stop   04/20/24 0825  ceFAZolin  (ANCEF ) IVPB 3g/150 mL premix        3 g 300 mL/hr over 30 Minutes Intravenous 30 min pre-op 04/20/24 0827 04/20/24 0958       Subjective: Patient was seen and examined at bedside. Patient is status post C-section.  POD #1.   She reports feeling better,  She was sitting comfortably.  Objective: Vitals:   04/20/24 1215 04/20/24 1230 04/20/24 1300 04/20/24 1400  BP: 126/61 125/76 123/65 133/72  Pulse: (!) 47 (!) 44 (!) 57 (!) 52  Resp:   16 16  Temp: (!) 97.4 F (36.3 C)  (!) 97.5 F (36.4 C) 97.9 F (36.6 C)  TempSrc: Axillary   Oral  SpO2: 100% 100% 96% 99%  Weight:      Height:        Intake/Output Summary (Last 24 hours) at 04/20/2024 1505 Last data filed at 04/20/2024 1432 Gross per 24 hour  Intake 5221.21 ml  Output 2041 ml  Net 3180.21 ml   Filed Weights   03/24/24 1417  Weight: (!) 161.5 kg    Examination:  General exam: Appears calm and comfortable, not in any acute distress. Respiratory system: CTA Bilaterally. Respiratory effort normal. RR 15 Cardiovascular system: S1 & S2 heard, RRR. No JVD, murmurs, rubs, gallops or clicks.  Gastrointestinal system: Abdomen is nondistended, soft and nontender.  Normal bowel sounds heard. Central nervous system: Alert and oriented x 3. No focal neurological deficits. Extremities: Edema+, No cyanosis , no clubbing. Skin: No rashes, lesions or  ulcers Psychiatry: Judgement and insight appear normal. Mood & affect appropriate.     Data Reviewed: I have personally reviewed following labs and imaging studies  CBC: Recent Labs  Lab 04/15/24 0546 04/17/24 1116 04/18/24 0455 04/20/24 0831  WBC 7.0 6.7 8.0 13.3*  NEUTROABS  --   --  5.5  --   HGB 9.5* 9.9* 10.2* 9.8*  HCT 30.5* 31.7* 32.1* 31.2*  MCV 81.1 82.6 82.1 82.5  PLT 191 196 204 220   Basic Metabolic  Panel: Recent Labs  Lab 04/16/24 0428 04/16/24 0958 04/17/24 0539 04/18/24 0455 04/19/24 0526 04/20/24 0558 04/20/24 1319  NA  --  137 135 137 136 137  --   K  --  3.9 3.8 3.9 4.4 4.2  --   CL  --  111 111 109 110 109  --   CO2  --  18* 19* 21* 19* 22  --   GLUCOSE  --  124* 92 92 124* 124*  --   BUN  --  <5* <5* <5* 5* 6  --   CREATININE  --  0.67 0.59 0.68 0.70 0.90 0.88  CALCIUM   --  10.1 10.7* 13.7* 12.1* 12.9*  --   MG 1.6*  --  1.8 1.6* 1.6* 1.6*  --    GFR: Estimated Creatinine Clearance: 149.4 mL/min (by C-G formula based on SCr of 0.88 mg/dL). Liver Function Tests: Recent Labs  Lab 04/14/24 0558 04/18/24 0455 04/19/24 0526 04/20/24 0558  AST 16  --  16 22  ALT 12  --  12 12  ALKPHOS 63  --  74 80  BILITOT 0.6  --  0.7 0.6  PROT 5.3*  --  5.7* 6.4*  ALBUMIN 2.3* 2.4* 2.5* 2.8*   No results for input(s): LIPASE, AMYLASE in the last 168 hours. No results for input(s): AMMONIA in the last 168 hours. Coagulation Profile: No results for input(s): INR, PROTIME in the last 168 hours. Cardiac Enzymes: No results for input(s): CKTOTAL, CKMB, CKMBINDEX, TROPONINI in the last 168 hours. BNP (last 3 results) No results for input(s): PROBNP in the last 8760 hours. HbA1C: No results for input(s): HGBA1C in the last 72 hours. CBG: No results for input(s): GLUCAP in the last 168 hours. Lipid Profile: No results for input(s): CHOL, HDL, LDLCALC, TRIG, CHOLHDL, LDLDIRECT in the last 72 hours. Thyroid  Function Tests: No results for input(s): TSH, T4TOTAL, FREET4, T3FREE, THYROIDAB in the last 72 hours. Anemia Panel: No results for input(s): VITAMINB12, FOLATE, FERRITIN, TIBC, IRON , RETICCTPCT in the last 72 hours. Sepsis Labs: No results for input(s): PROCALCITON, LATICACIDVEN in the last 168 hours.  No results found for this or any previous visit (from the past 240 hours).   Radiology Studies: No results  found.  Scheduled Meds:  acetaminophen   1,000 mg Oral Q6H   [START ON 04/21/2024] enoxaparin (LOVENOX) injection  40 mg Subcutaneous Q12H   ketorolac   30 mg Intravenous Q6H   Followed by   NOREEN ON 04/21/2024] ibuprofen   600 mg Oral Q6H   labetalol   600 mg Oral Q8H   potassium chloride   40 mEq Oral TID   prenatal multivitamin  1 tablet Oral Q1200   [START ON 04/21/2024] senna-docusate  2 tablet Oral Daily   simethicone   80 mg Oral TID PC   Continuous Infusions:  sodium chloride  Stopped (04/16/24 1130)   magnesium  sulfate 2 g/hr (04/20/24 1142)   oxytocin        LOS: 27 days    Time spent:  50 mins    Darcel Dawley, MD [facility]   If 7PM-7AM, please contact night-coverage

## 2024-04-20 NOTE — Transfer of Care (Signed)
 Immediate Anesthesia Transfer of Care Note  Patient: [name]  Procedure(s) Performed: INDUCTION  CESAREAN DELIVERY  Patient Location: PACU  Anesthesia Type:Spinal  Level of Consciousness: awake  Airway & Oxygen Therapy: Patient Spontanous Breathing  Post-op Assessment: Report given to RN and Post -op Vital signs reviewed and stable  Post vital signs: Reviewed and stable  Last Vitals:  Vitals Value Taken Time  BP 129/100 04/20/24 11:30  Temp 36.7 C 04/20/24 11:30  Pulse 54 04/20/24 11:32  Resp 17 04/20/24 11:32  SpO2 100 % 04/20/24 11:32  Vitals shown include unfiled device data.  Last Pain:  Vitals:   04/20/24 1130  TempSrc: Oral  PainSc:       Patients Stated Pain Goal: 4 (03/30/24 0830)  Complications: No notable events documented.

## 2024-04-20 NOTE — Op Note (Signed)
 C-Section Operative Note  Date: 04/20/24  Preoperative Diagnosis: [redacted] weeks gestation Malpresentation Preeclampsia with severe features Previous cesarean section   Postoperative Diagnosis: Same as pre-op  Procedure: repeat low transverse cesarean section without extension  Surgeon: LOIS Sharps, DO Assist: CHARLENA Moats, MD    An experienced assistant was required given the standard of surgical care given the complexity of the case and maternal body habitus.  This assistant was needed for exposure, dissection, suctioning, retraction, instrument exchange, assisting with delivery with administration of fundal pressure, and for overall help during the procedure.     Operative Findings: Dense rectus fascia. Gravid uterus without adhesive disease. Thin lower uterine segment. Clear amniotic fluid. Viable female infant in frank breech position weighing 3160g with APGARS of 9 and 9 at 1 and 5 minutes, respectively. Normal fallopian tubes and ovaries bilaterally. Specimens: Placenta to pathology EBL 344 UOP 150  Consent:  R/B/A of cesarean section discussed with patient. Alternative would be vaginal delivery which would mean shorter postpartum stay and decreased risk of bleeding. Risks of cesarean section include but are not limited to infection of the uterus, pelvic organs, or skin, inadvertent injury to internal organs, such as bowel or bladder, vasculature, and nerves. If there is major injury, extensive surgery may be required. If injury is minor, it may be treated with relative ease and repaired at the time of injury. Discussed possibility of excessive blood loss and transfusion. If bleeding cannot be controlled using medical or minor surgical methods, a cesarean hysterectomy may be performed which would mean no future fertility. Patient accepts the possibility of blood transfusion, if necessary. Patient understands and agrees to move forward with section.   Operative Procedure: Patient was taken to the  operating room where spinal anesthesia was found to be adequate by Allis clamp test. She was prepped and draped in the normal sterile fashion in the dorsal supine position with a leftward tilt. An appropriate time out was performed. A Pfannenstiel skin incision was then made with the scalpel and carried through to the underlying layer of fascia by sharp dissection and Bovie cautery. The fascia was nicked in the midline and the incision was extended laterally with Mayo scissors. The superior aspect of the incision was grasped Kocher clamps and dissected off the underlying rectus muscles. In a similar fashion, the inferior aspect was dissected off the rectus muscles. Rectus muscles were separated in the midline and the peritoneal cavity entered with sharp Metzenbaum scissors. The peritoneal incision was then extended both superiorly and inferiorly with careful attention to avoid both bowel and bladder. The Alexis self-retaining wound retractor was then placed within the incision and the lower uterine segment exposed. The bladder flap was developed with Metzenbaum scissors and pushed away from the lower uterine segment. The lower uterine segment was then incised in a low transverse fashion and the cavity itself entered bluntly. The incision was extended bluntly. Amniotic sac was ruptured and fluid was noted to be clear in color. The fetus was found in frank breech presentation. The fetal buttock was brought the hysterotomy and the infant delivered with standard breech maneuvers without difficulty. The nose and mouth bulb suctioned with the cord clamped and cut as well after 1 minute. The infant was handed off to the waiting NICU team. The placenta was then spontaneously expressed from the uterus and the uterus cleared of all clots and debris with moist lap sponge. The uterine incision was then repaired in 2 layers the first layer was a running locked  layer of 0-vicryl and the second an imbricating layer of the same  suture. There was a sinus at the superior edge of the hysterotomy which was tied off with 2-0 vicryl in a figure-of-eight fashion. Arista was applied and hemostasis seen. The tubes and ovaries were inspected and the gutters cleared of all clots and debris. The uterine incision was inspected and found to be hemostatic. All instruments and sponges as well as the Alexis retractor were then removed from the abdomen. The rectus muscles were then reapproximated with several interrupted mattress sutures of 2-0 Vicryl. The fascia was then closed with 0 Vicryl in a running fashion. Subcutaneous tissue was reapproximated with 2-0 plain in a running fashion. The skin was closed with a subcuticular stitch of 4-0 Vicryl on a Keith needle and then reinforced with a Prevena dressing. At the conclusion of the procedure all instruments and sponge counts were correct. Patient was taken to the recovery room in good condition. Her son was brought to the NICU given his gestational age.  CLAUDENE MORT

## 2024-04-20 NOTE — Anesthesia Preprocedure Evaluation (Signed)
 Anesthesia Evaluation  Patient identified by MRN, date of birth, ID band Patient awake    Reviewed: Allergy & Precautions, H&P , NPO status , Patient's Chart, lab work & pertinent test results, reviewed documented beta blocker date and time   Airway Mallampati: II  TM Distance: >3 FB Neck ROM: full    Dental no notable dental hx. (+) Teeth Intact, Dental Advisory Given   Pulmonary neg pulmonary ROS   Pulmonary exam normal breath sounds clear to auscultation       Cardiovascular hypertension, Pt. on medications Normal cardiovascular exam Rhythm:regular Rate:Normal  ECHO 6/25 1. Left ventricular ejection fraction, by estimation, is 55 to 60%. The  left ventricle has normal function. The left ventricle has no regional  wall motion abnormalities. There is mild concentric left ventricular  hypertrophy. Left ventricular diastolic  parameters were normal.   2. Right ventricular systolic function is normal. The right ventricular  size is normal. There is normal pulmonary artery systolic pressure.   3. Left atrial size was mild to moderately dilated.   4. Right atrial size was mildly dilated.   5. The mitral valve is normal in structure. Mild mitral valve  regurgitation.   6. The aortic valve is tricuspid. Aortic valve regurgitation is not  visualized.   7. The inferior vena cava is dilated in size with <50% respiratory  variability, suggesting right atrial pressure of 15 mmHg.      Neuro/Psych Seizures -, Well Controlled,   Anxiety      negative psych ROS   GI/Hepatic negative GI ROS, Neg liver ROS,,,  Endo/Other  negative endocrine ROS    Renal/GU negative Renal ROS  negative genitourinary   Musculoskeletal   Abdominal   Peds  Hematology  (+) Blood dyscrasia, anemia   Anesthesia Other Findings PIH, Hyperparathyroidism  Reproductive/Obstetrics (+) Pregnancy                              Anesthesia Physical Anesthesia Plan  ASA: 4  Anesthesia Plan: Spinal   Post-op Pain Management: Minimal or no pain anticipated   Induction: Intravenous  PONV Risk Score and Plan: 2 and Ondansetron , Dexamethasone  and Treatment may vary due to age or medical condition  Airway Management Planned: Natural Airway and Simple Face Mask  Additional Equipment:   Intra-op Plan:   Post-operative Plan:   Informed Consent: I have reviewed the patients History and Physical, chart, labs and discussed the procedure including the risks, benefits and alternatives for the proposed anesthesia with the patient or authorized representative who has indicated his/her understanding and acceptance.       Plan Discussed with: Anesthesiologist and CRNA  Anesthesia Plan Comments:         Anesthesia Quick Evaluation

## 2024-04-20 NOTE — Progress Notes (Signed)
 Patient ID: Beth [name], female   DOB: 03-01-1986, 38 y.o.   MRN: [id number]  Patient recently transferred to L&D for IOL TOLAC given preE w/ SF. Met with patient at bedside. SVE: 1/40/-3, BSUS confirms breech presentation. We had a discussion regarding updated plan of care. Given GA, I believe expectant management is not a good idea. She also inquires about version. Given prematurity and cervical exam remote from AROM, I believe the best plan to be repeat cesarean section. She is anxious given her prior cesarean section and inadequate analgesia. We also is sad because she anticipates recovery from another surgery during this hospitalization as well. I empathized with her and addressed her concerns. After our discussion, she is amenable to delivery via repeat cesarean section. She does not desire sterilization.      04/20/2024    6:44 AM 04/20/2024    6:02 AM 04/19/2024   11:04 PM  Vitals with BMI  Systolic 125 137 850  Diastolic 60 75 67  Pulse 85 80 65   Plan:  - OR team updated about new MOD - Ancef  for surgical prophylaxis - Reviewed R/B of cesarean section. Risks including but not limited to bleeding, infection, injury to nearby structures (bowel, bladder, vasculature, nerves, baby), risk of blood transfusion, and possible hysterectomy. Expresses understanding - Her husband will be en route to the hospital soon

## 2024-04-20 NOTE — Progress Notes (Signed)
 Patient ID: Beth [name], female   DOB: 07/27/1986, 38 y.o.   MRN: [id number]  On review of BP:162/97 @ 1956, 136/60 @ 2058  Afternoon PO Labetalol  was held given bradycardia, pulse 52-57 and BP normotensive 122/67 after discussion with day RN. Suspect anesthetic effects.   Her pulse most recently is in the high 50s to low 60s range, slowly improving since c-section. Given this, will need to continue to hold PO labetalol  and treat severe range BP with PRN IV hydralazine . Patient has previously been unable to tolerate PO procardia . Called RN to update, she will notify if BP >/= 160/110

## 2024-04-20 NOTE — Anesthesia Postprocedure Evaluation (Signed)
 Anesthesia Post Note  Patient: [name]  Procedure(s) Performed: INDUCTION  CESAREAN DELIVERY     Patient location during evaluation: PACU Anesthesia Type: Spinal Level of consciousness: oriented and awake and alert Pain management: pain level controlled Vital Signs Assessment: post-procedure vital signs reviewed and stable Respiratory status: spontaneous breathing, respiratory function stable and patient connected to nasal cannula oxygen Cardiovascular status: blood pressure returned to baseline and stable Postop Assessment: no headache, no backache and no apparent nausea or vomiting Anesthetic complications: no   No notable events documented.  Last Vitals:  Vitals:   04/20/24 1400 04/20/24 1500  BP: 133/72   Pulse: (!) 52   Resp: 16 16  Temp: 36.6 C 36.8 C  SpO2: 99%     Last Pain:  Vitals:   04/20/24 1500  TempSrc: Oral  PainSc:    Pain Goal: Patients Stated Pain Goal: 4 (03/30/24 0830)                 Beth [name]

## 2024-04-21 ENCOUNTER — Inpatient Hospital Stay (HOSPITAL_COMMUNITY)

## 2024-04-21 LAB — CBC
HCT: 29.5 % — ABNORMAL LOW (ref 36.0–46.0)
Hemoglobin: 9.2 g/dL — ABNORMAL LOW (ref 12.0–15.0)
MCH: 25.8 pg — ABNORMAL LOW (ref 26.0–34.0)
MCHC: 31.2 g/dL (ref 30.0–36.0)
MCV: 82.6 fL (ref 80.0–100.0)
Platelets: 214 10*3/uL (ref 150–400)
RBC: 3.57 MIL/uL — ABNORMAL LOW (ref 3.87–5.11)
RDW: 22.7 % — ABNORMAL HIGH (ref 11.5–15.5)
WBC: 12.2 10*3/uL — ABNORMAL HIGH (ref 4.0–10.5)
nRBC: 0 % (ref 0.0–0.2)

## 2024-04-21 LAB — MAGNESIUM: Magnesium: 4.7 mg/dL — ABNORMAL HIGH (ref 1.7–2.4)

## 2024-04-21 LAB — COMPREHENSIVE METABOLIC PANEL WITH GFR
ALT: 11 U/L (ref 0–44)
AST: 23 U/L (ref 15–41)
Albumin: 2.3 g/dL — ABNORMAL LOW (ref 3.5–5.0)
Alkaline Phosphatase: 66 U/L (ref 38–126)
Anion gap: 4 — ABNORMAL LOW (ref 5–15)
BUN: 9 mg/dL (ref 6–20)
CO2: 24 mmol/L (ref 22–32)
Calcium: 11.8 mg/dL — ABNORMAL HIGH (ref 8.9–10.3)
Chloride: 105 mmol/L (ref 98–111)
Creatinine, Ser: 0.9 mg/dL (ref 0.44–1.00)
GFR, Estimated: >60 mL/min (ref >60)
Glucose, Bld: 105 mg/dL — ABNORMAL HIGH (ref 70–99)
Potassium: 4.1 mmol/L (ref 3.5–5.1)
Sodium: 133 mmol/L — ABNORMAL LOW (ref 135–145)
Total Bilirubin: 0.8 mg/dL (ref 0.0–1.2)
Total Protein: 5.1 g/dL — ABNORMAL LOW (ref 6.5–8.1)

## 2024-04-21 MED ORDER — LABETALOL HCL 100 MG PO TABS
100.0000 mg | ORAL_TABLET | ORAL | Status: AC
  Administered 2024-04-21: 100 mg via ORAL
  Filled 2024-04-21: qty 1

## 2024-04-21 MED ORDER — MAGNESIUM OXIDE -MG SUPPLEMENT 400 (240 MG) MG PO TABS
400.0000 mg | ORAL_TABLET | Freq: Four times a day (QID) | ORAL | Status: DC
  Administered 2024-04-21 – 2024-04-27 (×20): 400 mg via ORAL
  Filled 2024-04-21 (×21): qty 1

## 2024-04-21 MED ORDER — LABETALOL HCL 100 MG PO TABS
100.0000 mg | ORAL_TABLET | Freq: Three times a day (TID) | ORAL | Status: DC
  Administered 2024-04-21 – 2024-04-22 (×3): 100 mg via ORAL
  Filled 2024-04-21 (×3): qty 1

## 2024-04-21 MED ORDER — PNEUMOCOCCAL 20-VAL CONJ VACC 0.5 ML IM SUSY
0.5000 mL | PREFILLED_SYRINGE | INTRAMUSCULAR | Status: DC
  Filled 2024-04-21: qty 0.5

## 2024-04-21 NOTE — Progress Notes (Signed)

## 2024-04-21 NOTE — Lactation Note (Signed)
 This note was copied from a baby's chart.  NICU Lactation Consultation Note  Patient Name: Boy Maddox Bratcher Unijb'd Date: 04/21/2024 Age: 37 hrs  Reason for consult: Initial assessment; NICU baby; Late-preterm 34-36.6wks; Other (Comment) (AMA, IVF pregnancy)  SUBJECTIVE  LC in to visit with P4 Mom of [redacted]w[redacted]d baby [name] delivered by C/Section and taken to NICU due to prematurity.  Baby currently is NPO and CPAP. Mom is experienced breastfeeding and pumping with her other babies.  She desires to breastfeed and pump and bottle feed.  LC educated Mom on the importance of initiating pumping as early as possible.  Mom desires to go to NICU to do STS with baby and pump AFTER.  LC set up the pump and washing and drying basins providing handouts on cleaning.  RN will assist with first pumping.  LC wrote plan on dry erase board- 1- STS with baby  2- breast massage and hand expression often 3- Pump both breasts on initiation setting every 3 hrs or a goal of 8 times per 24 hrs 4- ask for assistance prn  OBJECTIVE Infant data: No data recorded O2 Device: CPAP FiO2 (%): 21 %  Infant feeding assessment No data recorded  Maternal data: [id number] C-Section, Low Transverse Has patient been taught Hand Expression?: No Hand Expression Comments: Will review this tomorrow as Mom was heading to NICU Significant Breast History:: ++ breast changes Current breast feeding challenges:: Infant in NICU Previous breastfeeding challenges?: Low milk supply (later in breastfeeding, added formula) Does the patient have breastfeeding experience prior to this delivery?: Yes How long did the patient breastfeed?: 6-10 months with 3 prior babies Pumping frequency: Mom will initate pumping when returning from NICU and encouraged to pump every 3 hrs Flange Size: 21 Risk factor for low/delayed milk supply:: infant separation  WIC Program: No WIC Referral Sent?: No Pump: Hands Free, Personal ([employer] from  insurance)  ASSESSMENT Infant: Feeding Status: NPO  Maternal: No data recorded INTERVENTIONS/PLAN Interventions: Interventions: Skin to skin; Breast massage; Hand express; Breast feeding basics reviewed; DEBP; Education; [employer] brochure; CDC Guidelines for Breast Pump Cleaning; NICU Pumping Log Tools: Pump; Flanges Pump Education: Setup, frequency, and cleaning; Milk Storage  Plan: Consult Status: NICU follow-up NICU Follow-up type: New admission follow up   [name] 04/21/2024, 9:12 AM

## 2024-04-21 NOTE — Progress Notes (Signed)
 Subjective: Beth [name] has been tired, which she attributes to her magnesium  infusion yesterday.  Otherwise she is recovering well.  No polyuria or polydipsia now.  She anticipates returning home soon.  She anticipates that her son will remain in hospital for 3 to 4 weeks.  To her knowledge, he is healthy.  She has not yet heard about his recent lab test results from his chemistry panel.    Objective: Vital signs in last 24 hours: Temp:  [97.5 F (36.4 C)-98.2 F (36.8 C)] 98.2 F (36.8 C) (06/26 1112) Pulse Rate:  [52-66] 64 (06/26 1112) Resp:  [16-18] 18 (06/26 1112) BP: (119-162)/(51-97) 144/75 (06/26 1112) SpO2:  [96 %-100 %] 100 % (06/26 1112) Weight change:  Last BM Date : 04/09/24  Intake/Output from previous day: 06/25 0701 - 06/26 0700 In: 7064.2 [P.O.:4580; I.V.:2484.2] Out: 3485 [Urine:2775; Blood:710] Intake/Output this shift: Total I/O In: 229.9 [I.V.:229.9] Out: 1000 [Urine:1000]  General appearance: alert, cooperative, and no distress Resp: clear to auscultation bilaterally  Lab Results: Recent Labs    04/20/24 0831 04/21/24 0527  WBC 13.3* 12.2*  HGB 9.8* 9.2*  HCT 31.2* 29.5*  PLT 220 214   BMET Recent Labs    04/20/24 0558 04/20/24 1319 04/21/24 0527  NA 137  --  133*  K 4.2  --  4.1  CL 109  --  105  CO2 22  --  24  GLUCOSE 124*  --  105*  BUN 6  --  9  CREATININE 0.90 0.88 0.90  CALCIUM  12.9*  --  11.8*    Studies/Results: No results found.  Medications: Scheduled:  acetaminophen   1,000 mg Oral Q6H   enoxaparin (LOVENOX) injection  40 mg Subcutaneous Q12H   ibuprofen   600 mg Oral Q6H   labetalol   100 mg Oral Q8H   magnesium  oxide  400 mg Oral QID   [START ON 04/22/2024] pneumococcal 20-valent conjugate vaccine  0.5 mL Intramuscular Tomorrow-1000   potassium chloride   40 mEq Oral TID   prenatal multivitamin  1 tablet Oral Q1200   senna-docusate  2 tablet Oral Daily   simethicone   80 mg Oral TID PC   Continuous:  sodium chloride   Stopped (04/16/24 1130)    Assessment/Plan:  Primary hyperparathyroidism with hypercalcemia and hypercalciuria. History of hypokalemia History of hypomagnesemia.   Today we had a detailed discussion about her hypercalcemia, anticipated surgery, and potential medicine options if her hypercalcemia becomes symptomatic again (Calcitonin, Cinacalcet).   She agreed to continue drinking plenty of water and weight-bearing exercise as able (while following any appropriate lifting restrictions due to her C-section).   From my standpoint, she may return home at the discretion of her obstetrician and her hospitalist.  Beth [name] agreed to a clinic visit with me next week.    LOS: 28 days   Beth [name] 04/21/2024, 12:34 PM

## 2024-04-21 NOTE — Lactation Note (Signed)
 This note was copied from a baby's chart.  NICU Lactation Consultation Note  Patient Name: Boy Zykeriah Mathia Unijb'd Date: 04/21/2024 Age:38 years  Reason for consult: Follow-up assessment; NICU baby; Late-preterm 34-36.6wks  SUBJECTIVE  LC in to visit with P4 Mom of baby [name] in the NICU.  Mom eating breakfast and states she is feeling well.  LC noted pump parts in washing basin and asked Mom if they were needing to be cleaned.  Mom stated she forgot to clean them after pumping last night.  Mom states she pumped 3 times yesterday.  LC disassembled all the pump parts, washed, rinsed and placed in basin with clean paper towel.  Offered to help Mom pump after cleaning them, but Mom wanted to eat.  Magnesium  sulfate still running, talked about the hands free pumping top she can ask for once her IV is discontinued.  Reviewed breast massage and hand expression. Mom encouraged to pump more consistently today, and include STS with baby in the NICU.  Mom aware of need to take all her pump parts to baby's room on her discharge from [facility]   Engorgement prevention and treatment reviewed Encouraged Mom to ask for milk labels from baby's RN.  OBJECTIVE Infant data: Mother's Current Feeding Choice: Breast Milk and Donor Milk  O2 Device: (S) Room Air FiO2 (%): 21 %  Infant feeding assessment No data recorded  Maternal data: [id number] C-Section, Low Transverse Has patient been taught Hand Expression?: Yes Hand Expression Comments: Reviewed this verbally Significant Breast History:: ++ breast changes Current breast feeding challenges:: Infant in NICU Previous breastfeeding challenges?: Low milk supply (later in breastfeeding, added formula) Does the patient have breastfeeding experience prior to this delivery?: Yes How long did the patient breastfeed?: 6-10 months with 3 prior babies Pumping frequency: Mom pumped 3 times so far, encouraged to increase frequency to 8 times per 24 hrs Pumped  volume: 0 mL (drops) Flange Size: 21 Risk factor for low/delayed milk supply:: infant separation  WIC Program: No WIC Referral Sent?: No Pump: Hands Free, Copywriter, advertising ([employer] from insurance)  ASSESSMENT Infant:              Feeding Status: NPO  Maternal: Milk volume: Normal  INTERVENTIONS/PLAN Interventions: Interventions: Breast feeding basics reviewed; Skin to skin; Breast massage; Hand express; DEBP; Education Discharge Education: Engorgement and breast care Tools: Pump; Flanges Pump Education: Setup, frequency, and cleaning; Milk Storage  Plan: Consult Status: NICU follow-up NICU Follow-up type: Verify onset of copious milk; Verify absence of engorgement; Maternal D/C visit   [name] 04/21/2024, 10:07 AM

## 2024-04-21 NOTE — Progress Notes (Signed)
 PROGRESS NOTE    Beth [name]  FMW:[id number] DOB: [birth year] DOA: 03/24/2024 PCP: Patient, No Pcp Per   Brief Narrative:  This 38 yrs old female G4P3 at [redacted] weeks GA with PMH significant for anxiety,  previous history of hypothyroidism, family history of renal potassium wasting, Patient was admitted on 5/29 with preeclampsia by OB.  We were consulted on 6/01 for hypokalemia.  She was treated with magnesium  and potassium supplements.  Her potassium has now remained in a good range.  Blood pressure has been better controlled.  Plan is to proceed with delivery at 34 weeks.  Patient has developed hypercalcemia.  We were consulted on 6/14  to assist with management. Calcium  level has been ranging around 10.5----has been slowly increasing since 6/09 ---to 11----subsequently 13.6--- Calcium  correction by albumin 14.2 Patient s/p C section 6/25.POD # 1.   Assessment & Plan:   Active Problems:   * No active hospital problems. *  Hypercalcemia: Primary hyperparathyroidism: PTH: 46, PTH like peptide pending, Vitamin D : 61, TSH. 0.6 Dr. Faythe Endocrinologist on board, Patient appears to have primary hyperparathyroidism,  24-hour urine calcium  excludes familial and acquired hypocalciuric hypercalcemia. Continue IV Fluids General Surgery consulted, plan for parathyroid  surgery postdelivery. Continue to monitor calcium  levels. Dr. Faythe had detailed discussion with the patient regarding treatment for hypercalcemia. Patient continued to drink plenty of water and weightbearing exercises. We have no further recommendation.  She is cleared from medical perspective for discharge.   Preeclampsia: Status post repeat Low transverse C-section without extension. Treatment as per OB/GYN.   Left Ventricle Hypertrophy on EKG; ECHO Ef 55% to 60 % There is mild concentric left ventricular hypertrophy.    Hypomagnesemia: Replete IV as needed.   Estimated body mass index is 49.65 kg/m as calculated from the  following:   Height as of this encounter: 5' 11 (1.803 m).   Weight as of this encounter: 161.5 kg.  Will continue to follow-up as she remained inpatient.  Procedures: C Section.  Antimicrobials:  Anti-infectives (From admission, onward)    Start     Dose/Rate Route Frequency Ordered Stop   04/20/24 0825  ceFAZolin  (ANCEF ) IVPB 3g/150 mL premix        3 g 300 mL/hr over 30 Minutes Intravenous 30 min pre-op 04/20/24 0827 04/20/24 0958       Subjective: Patient was seen and examined at bedside.  Patient is status post C-section.  POD #1.   She reports feeling better,  She was sitting comfortably, having breakfast.  Objective: Vitals:   04/21/24 0834 04/21/24 0938 04/21/24 1014 04/21/24 1112  BP:    (!) 144/75  Pulse:    64  Resp: 18 18 18 18   Temp:    98.2 F (36.8 C)  TempSrc:    Oral  SpO2:    100%  Weight:      Height:        Intake/Output Summary (Last 24 hours) at 04/21/2024 1312 Last data filed at 04/21/2024 1014 Gross per 24 hour  Intake 5730.1 ml  Output 3180 ml  Net 2550.1 ml   Filed Weights   03/24/24 1417  Weight: (!) 161.5 kg    Examination:  General exam: Appears calm and comfortable, not in any acute distress. Respiratory system: CTA Bilaterally. Respiratory effort normal. RR 15 Cardiovascular system: S1 & S2 heard, RRR. No JVD, murmurs, rubs, gallops or clicks.  Gastrointestinal system: Abdomen is nondistended, soft and nontender.  Normal bowel sounds heard. Central nervous system: Alert and oriented  x 3. No focal neurological deficits. Extremities: Edema+, No cyanosis , no clubbing. Skin: No rashes, lesions or ulcers Psychiatry: Judgement and insight appear normal. Mood & affect appropriate.     Data Reviewed: I have personally reviewed following labs and imaging studies  CBC: Recent Labs  Lab 04/15/24 0546 04/17/24 1116 04/18/24 0455 04/20/24 0831 04/21/24 0527  WBC 7.0 6.7 8.0 13.3* 12.2*  NEUTROABS  --   --  5.5  --   --   HGB  9.5* 9.9* 10.2* 9.8* 9.2*  HCT 30.5* 31.7* 32.1* 31.2* 29.5*  MCV 81.1 82.6 82.1 82.5 82.6  PLT 191 196 204 220 214   Basic Metabolic Panel: Recent Labs  Lab 04/17/24 0539 04/18/24 0455 04/19/24 0526 04/20/24 0558 04/20/24 1319 04/21/24 0527  NA 135 137 136 137  --  133*  K 3.8 3.9 4.4 4.2  --  4.1  CL 111 109 110 109  --  105  CO2 19* 21* 19* 22  --  24  GLUCOSE 92 92 124* 124*  --  105*  BUN <5* <5* 5* 6  --  9  CREATININE 0.59 0.68 0.70 0.90 0.88 0.90  CALCIUM  10.7* 13.7* 12.1* 12.9*  --  11.8*  MG 1.8 1.6* 1.6* 1.6*  --  4.7*   GFR: Estimated Creatinine Clearance: 146.1 mL/min (by C-G formula based on SCr of 0.9 mg/dL). Liver Function Tests: Recent Labs  Lab 04/18/24 0455 04/19/24 0526 04/20/24 0558 04/21/24 0527  AST  --  16 22 23   ALT  --  12 12 11   ALKPHOS  --  74 80 66  BILITOT  --  0.7 0.6 0.8  PROT  --  5.7* 6.4* 5.1*  ALBUMIN 2.4* 2.5* 2.8* 2.3*   No results for input(s): LIPASE, AMYLASE in the last 168 hours. No results for input(s): AMMONIA in the last 168 hours. Coagulation Profile: No results for input(s): INR, PROTIME in the last 168 hours. Cardiac Enzymes: No results for input(s): CKTOTAL, CKMB, CKMBINDEX, TROPONINI in the last 168 hours. BNP (last 3 results) No results for input(s): PROBNP in the last 8760 hours. HbA1C: No results for input(s): HGBA1C in the last 72 hours. CBG: No results for input(s): GLUCAP in the last 168 hours. Lipid Profile: No results for input(s): CHOL, HDL, LDLCALC, TRIG, CHOLHDL, LDLDIRECT in the last 72 hours. Thyroid  Function Tests: No results for input(s): TSH, T4TOTAL, FREET4, T3FREE, THYROIDAB in the last 72 hours. Anemia Panel: No results for input(s): VITAMINB12, FOLATE, FERRITIN, TIBC, IRON , RETICCTPCT in the last 72 hours. Sepsis Labs: No results for input(s): PROCALCITON, LATICACIDVEN in the last 168 hours.  No results found for this or any  previous visit (from the past 240 hours).   Radiology Studies: No results found.  Scheduled Meds:  acetaminophen   1,000 mg Oral Q6H   enoxaparin (LOVENOX) injection  40 mg Subcutaneous Q12H   ibuprofen   600 mg Oral Q6H   labetalol   100 mg Oral Q8H   magnesium  oxide  400 mg Oral QID   [START ON 04/22/2024] pneumococcal 20-valent conjugate vaccine  0.5 mL Intramuscular Tomorrow-1000   potassium chloride   40 mEq Oral TID   prenatal multivitamin  1 tablet Oral Q1200   senna-docusate  2 tablet Oral Daily   simethicone   80 mg Oral TID PC   Continuous Infusions:  sodium chloride  Stopped (04/16/24 1130)     LOS: 28 days    Time spent: 35 mins    Darcel Dawley, MD [facility]  If 7PM-7AM, please contact night-coverage

## 2024-04-21 NOTE — Progress Notes (Signed)
 Aware of patient.  D/w OBGYN today.  Will order US  of thyroid  and Sestamibi scan for localization.  Dr. Eletha aware of patient and will visit in the next 1-2 days.  Cont conservative management right now for hypercalcemia and hydration.  Beth [name] Banter 3:29 PM 04/21/2024

## 2024-04-21 NOTE — Progress Notes (Signed)
 Subjective: Postpartum Day 1: Cesarean Delivery Patient reports feeling tired in the setting of magnesium  just being discontinued. Reports moderate incisional pain with movement. Has ambulated and voided since delivery. Uncertain if she has passed gas. Tolerating PO. Lochia minimal. Denies headache, vision changes, chest pain, SOB, RUQ pain. Baby boy is doing well in the NICU. She is pumping, no issues.  Objective: Vital signs in last 24 hours: Temp:  [97.6 F (36.4 C)-98.2 F (36.8 C)] 98.2 F (36.8 C) (06/26 1112) Pulse Rate:  [52-66] 64 (06/26 1112) Resp:  [16-18] 18 (06/26 1112) BP: (119-162)/(51-97) 144/75 (06/26 1112) SpO2:  [99 %-100 %] 100 % (06/26 1112)  Physical Exam:  General: alert, cooperative, and no distress Lochia: appropriate Uterine Fundus: firm Incision: Prevena in place with no output, abdomen is soft, non-tender DVT Evaluation: No evidence of DVT seen on physical exam.  Recent Labs    04/20/24 0831 04/21/24 0527  HGB 9.8* 9.2*  HCT 31.2* 29.5*    Assessment/Plan: Beth [name] is a 38 year old G4 now P4 s/p repeat C-section at [redacted]w[redacted]d for pre-eclampsia with severe features and breech presentation. -POD#1: doing well, continue routine post-op/postpartum care -pre-eclampsia w/ SF: Currently asx. Mag completed at 10:30. Patient with HR at the lower range of normal since C-section, so prior labetalol  dosing (600 TID) held, 100mg  given this morning at 6:30am. Since Bps increasing, will order 100 TID dosing of labetalol  for now and likely increase as HR tolerates. Previously had bad reaction to nifedipine . PO hydralazine  could be alternative if needed. -Primary hyperparathyroidism with associated electrolyte abnormalities: Internal medicine and endocrine following. General surgery consulted, made aware that patient is now postpartum. Gen surg PA to reach out to Dr [name] for f/u instructions.      -hypercalcemia: Ca 11.8 today, management per  medicine/endocrine. -hypokalemia: likely linked with hyperparathyroidism per Endo. Potassium still currently in normal range on potassium 40 mEq TID.  -hypomagnesemia: also likely linked with hyperparathyroidism per Endo. Mag elevated today in setting of just completing IV magnesium  for pre-E.  -anemia: post-op Hgb stable at 9.2. Consider PO iron  at discharge.  Dispo: Continue inpatient management as above.  Rubie DELENA Husky, MD 04/21/2024, 1:02 PM

## 2024-04-22 ENCOUNTER — Inpatient Hospital Stay (HOSPITAL_COMMUNITY)

## 2024-04-22 LAB — COMPREHENSIVE METABOLIC PANEL WITH GFR
ALT: 13 U/L (ref 0–44)
AST: 21 U/L (ref 15–41)
Albumin: 2.2 g/dL — ABNORMAL LOW (ref 3.5–5.0)
Alkaline Phosphatase: 61 U/L (ref 38–126)
Anion gap: 8 (ref 5–15)
BUN: 18 mg/dL (ref 6–20)
CO2: 23 mmol/L (ref 22–32)
Calcium: 12.6 mg/dL — ABNORMAL HIGH (ref 8.9–10.3)
Chloride: 107 mmol/L (ref 98–111)
Creatinine, Ser: 1.02 mg/dL — ABNORMAL HIGH (ref 0.44–1.00)
GFR, Estimated: >60 mL/min (ref >60)
Glucose, Bld: 90 mg/dL (ref 70–99)
Potassium: 4.2 mmol/L (ref 3.5–5.1)
Sodium: 138 mmol/L (ref 135–145)
Total Bilirubin: 0.3 mg/dL (ref 0.0–1.2)
Total Protein: 5.1 g/dL — ABNORMAL LOW (ref 6.5–8.1)

## 2024-04-22 LAB — MAGNESIUM: Magnesium: 1.9 mg/dL (ref 1.7–2.4)

## 2024-04-22 LAB — SURGICAL PATHOLOGY

## 2024-04-22 MED ORDER — LABETALOL HCL 200 MG PO TABS: 200.0000 mg | ORAL_TABLET | Freq: Three times a day (TID) | ORAL | Status: DC

## 2024-04-22 MED ORDER — IOHEXOL 350 MG/ML SOLN
75.0000 mL | Freq: Once | INTRAVENOUS | Status: AC | PRN
  Administered 2024-04-22: 75 mL via INTRAVENOUS

## 2024-04-22 MED ORDER — PNEUMOCOCCAL 20-VAL CONJ VACC 0.5 ML IM SUSY
0.5000 mL | PREFILLED_SYRINGE | INTRAMUSCULAR | Status: AC
  Administered 2024-04-23: 0.5 mL via INTRAMUSCULAR
  Filled 2024-04-22 (×2): qty 0.5

## 2024-04-22 MED ORDER — LABETALOL HCL 100 MG PO TABS
100.0000 mg | ORAL_TABLET | Freq: Three times a day (TID) | ORAL | Status: DC
  Administered 2024-04-22 (×2): 100 mg via ORAL
  Filled 2024-04-22 (×2): qty 1

## 2024-04-22 MED ORDER — LABETALOL HCL 200 MG PO TABS
200.0000 mg | ORAL_TABLET | Freq: Three times a day (TID) | ORAL | Status: DC
  Administered 2024-04-23 – 2024-04-24 (×4): 200 mg via ORAL
  Filled 2024-04-22 (×4): qty 1

## 2024-04-22 MED ORDER — LABETALOL HCL 100 MG PO TABS
100.0000 mg | ORAL_TABLET | Freq: Once | ORAL | Status: AC
  Administered 2024-04-22: 100 mg via ORAL
  Filled 2024-04-22: qty 1

## 2024-04-22 NOTE — Progress Notes (Signed)
 Subjective: Beth [name] notes that her fatigue, polydipsia, and polyuria persist.   However, she does not "feel awful" today.  No brain fog.    Beth [name] reports that her newborn son is doing rather well.  To her knowledge, he has not experienced neonatal hypocalcemia.  Objective: Vital signs in last 24 hours: Temp:  [97.5 F (36.4 C)-98.3 F (36.8 C)] 98.2 F (36.8 C) (06/27 0825) Pulse Rate:  [49-76] 56 (06/27 0825) Resp:  [16-18] 16 (06/27 0825) BP: (112-158)/(56-104) 112/75 (06/27 0825) SpO2:  [96 %-100 %] 96 % (06/27 0431) Weight change:  Last BM Date : 04/22/24  Intake/Output from previous day: 06/26 0701 - 06/27 0700 In: 709.9 [P.O.:480; I.V.:229.9] Out: 2100 [Urine:2100] Intake/Output this shift: No intake/output data recorded.  General appearance: alert and cooperative Resp: clear to auscultation bilaterally  Lab Results: Recent Labs    04/20/24 0831 04/21/24 0527  WBC 13.3* 12.2*  HGB 9.8* 9.2*  HCT 31.2* 29.5*  PLT 220 214   BMET Recent Labs    04/21/24 0527 04/22/24 0538  NA 133* 138  K 4.1 4.2  CL 105 107  CO2 24 23  GLUCOSE 105* 90  BUN 9 18  CREATININE 0.90 1.02*  CALCIUM  11.8* 12.6*    Studies/Results: US  THYROID  Result Date: 04/21/2024 CLINICAL DATA:  Other.  Hyperparathyroidism. EXAM: THYROID  ULTRASOUND TECHNIQUE: Ultrasound examination of the thyroid  gland and adjacent soft tissues was performed. COMPARISON:  04/11/2024 FINDINGS: Parenchymal Echotexture: Normal Isthmus: 1.2 cm Right lobe: 5.3 x 2.4 x 2.0 cm Left lobe: 5.0 x 2.3 x 2.1 cm _________________________________________________________ Estimated total number of nodules >/= 1 cm: 0 Number of spongiform nodules >/=  2 cm not described below (TR1): 0 Number of mixed cystic and solid nodules >/= 1.5 cm not described below (TR2): 0 _________________________________________________________ Stable 0.6 cm nodule in the superior left lobe that does not meet criteria for biopsy or follow-up. Stable  0.6 cm nodule in the isthmus that does not meet criteria for biopsy or follow-up. No enlarged or abnormal appearing lymph nodes are identified. No abnormal parathyroid  nodules identified. IMPRESSION: Stable 0.6 cm thyroid  nodules in the left lobe and isthmus that do not meet criteria for biopsy or follow-up. No abnormal parathyroid  nodules identified. Electronically Signed   By: Marcey Moan M.D.   On: 04/21/2024 15:58    Medications: Scheduled:  acetaminophen   1,000 mg Oral Q6H   enoxaparin  (LOVENOX ) injection  40 mg Subcutaneous Q12H   ibuprofen   600 mg Oral Q6H   labetalol   100 mg Oral Q8H   magnesium  oxide  400 mg Oral QID   pneumococcal 20-valent conjugate vaccine  0.5 mL Intramuscular Tomorrow-1000   potassium chloride   40 mEq Oral TID   prenatal multivitamin  1 tablet Oral Q1200   senna-docusate  2 tablet Oral Daily   simethicone   80 mg Oral TID PC   Continuous:  sodium chloride  Stopped (04/16/24 1130)    Assessment/Plan: #1.  Primary hyperparathyroidism with hypercalcemia and hypercalciuria, now postpartum. #2.  History of hypomagnesemia. #3.  History of hypokalemia. #4.  History of kidney stones.  Today I reviewed the laboratory reports, thyroid  ultrasound report, thyroid  ultrasound images from yesterday, and hospital notes.  I also spoke with her obstetrician.  We discussed the limitations with medicines for hyperglycemia during lactation.  As the patient has observed, calcitonin therapy tends to work well at first but the effectiveness is limited over time.  She agreed to consider calcitonin therapy again if her hypercalcemia symptoms worsen before  her parathyroid  surgery.   She declines calcitonin today.    Cinacalcet may be used to treat hypercalcemia from primary hyperparathyroidism.  Cinacalcet is excreted in the milk of lactating rats.  We can reconsider that option if hypercalcemia symptoms worsen without relief from calcitonin.    Pregnancy has multiple impacts on  calcium  homeostasis, including intestinal absorption of calcium , reabsorption of calcium  from bone, and renal excretion of calcium .  I recommend checking intact parathyroid  hormone, 25-hydroxy vitamin D , phosphorus, magnesium , total calcium , chloride, ionized calcium , albumin, creatinine, and TSH tomorrow morning.   LOS: 29 days   Beth [name] 04/22/2024, 10:41 AM

## 2024-04-22 NOTE — Progress Notes (Signed)
 Subjective: Postpartum Day 2: Cesarean Delivery Patient reports feeling tired in the setting of magnesium  just being discontinued. Reports moderate incisional pain with movement. Has ambulated and voided. Passing gas. Tolerating PO. Lochia minimal. Denies headache, vision changes, chest pain, SOB, RUQ pain. Baby boy is doing well in the NICU. She is pumping, no issues. Met with Dr Faythe, appreciate all Endocrine input in care thus far. Feeling some mild fatigue form increased calcium  this AM but is continuing to hydrate  Objective: Vital signs in last 24 hours: Temp:  [97.5 F (36.4 C)-98.3 F (36.8 C)] 98.2 F (36.8 C) (06/27 0825) Pulse Rate:  [49-76] 56 (06/27 0825) Resp:  [16-18] 16 (06/27 0825) BP: (112-158)/(56-104) 112/75 (06/27 0825) SpO2:  [96 %-100 %] 96 % (06/27 0431)  Physical Exam:  General: alert, cooperative, and no distress Lochia: appropriate Uterine Fundus: firm Incision: Prevena in place with no output, abdomen is soft, non-tender DVT Evaluation: No evidence of DVT seen on physical exam.  Recent Labs    04/20/24 0831 04/21/24 0527  HGB 9.8* 9.2*  HCT 31.2* 29.5*    Assessment/Plan: Beth [name] is a 38 year old G4 now P4 s/p repeat C-section at [redacted]w[redacted]d for pre-eclampsia with severe features and breech presentation. POD@2  today -POD#2: doing well, continue routine post-op/postpartum care. Prevena in place -pre-eclampsia w/ SF: Currently asx. Mag completed at 10:30 POD#1. Poor tolerance of Nifedipine . Currently on labetalol  100mg  BID, had a brief increase to 200mg  given one-time elevation overnight however given HR 56-64, will titrate back down -Primary hyperparathyroidism with associated electrolyte abnormalities: Internal medicine and endocrine following. General surgery consulted, made aware that patient is now postpartum. Gen surg PA to reach out to Dr [name] for f/u instructions.      -hypercalcemia: Ca 12.6 today, management per  medicine/endocrine. -hypokalemia: likely linked with hyperparathyroidism per Endo. Potassium still currently in normal range on potassium 40 mEq TID. 4.2 today -hypomagnesemia: also likely linked with hyperparathyroidism per Endo. Mag WNL today at 1.9 -anemia: post-op Hgb stable at 9.2. Consider PO iron  at discharge. -AKI: Cr 1.02 this AM. Getting IVF and PO hydrating. Voiding without issue. Already on I's/O's with appropriate output. Continue to trend  Dispo: Continue inpatient management as above.  Beth CHRISTELLA Guppy, MD 04/22/2024, 10:04 AM

## 2024-04-22 NOTE — Progress Notes (Signed)
 PROGRESS NOTE    Beth [name]  FMW:[id number] DOB: 1986/03/01 DOA: 03/24/2024 PCP: Patient, No Pcp Per   Brief Narrative:  This 38 yrs old female G4P3 at [redacted] weeks GA with PMH significant for anxiety,  previous history of hypothyroidism, family history of renal potassium wasting, Patient was admitted on 5/29 with preeclampsia by OB.  We were consulted on 6/01 for hypokalemia.  She was treated with magnesium  and potassium supplements.  Her potassium has now remained in a good range.  Blood pressure has been better controlled.  Plan is to proceed with delivery at 34 weeks.  Patient has developed hypercalcemia.  We were consulted on 6/14  to assist with management. Calcium  level has been ranging around 10.5----has been slowly increasing since 6/09 ---to 11----subsequently 13.6--- Calcium  correction by albumin 14.2 Patient s/p C section 6/25.POD # 2.   Assessment & Plan:   Active Problems:   * No active hospital problems. *  Hypercalcemia: Primary hyperparathyroidism: PTH: 46, PTH like peptide pending, Vitamin D : 61, TSH. 0.6 Dr. Faythe Endocrinologist on board, Patient appears to have primary hyperparathyroidism,  24-hour urine calcium  excludes familial and acquired hypocalciuric hypercalcemia. Continue IV Fluids General Surgery consulted, plan for parathyroid  surgery postdelivery. Continue to monitor calcium  levels. Dr. Faythe had detailed discussion with the patient regarding treatment for hypercalcemia. Patient would continue to drink plenty of water  and weightbearing exercises. General surgery ordered 4D CT scan of the neck with parathyroid  protocol in hopes of localizing the adenoma prior to surgery.  Dr. Faythe recommended checking intact PTH, vitamin D , phosphorus, magnesium , total calcium , ionized calcium , TSH tomorrow morning   Preeclampsia: Status post repeat Low transverse C-section without extension. Treatment as per OB/GYN.   Left Ventricle Hypertrophy on EKG; ECHO Ef 55% to 60  % There is mild concentric left ventricular hypertrophy.    Hypomagnesemia: Replete IV as needed.   Estimated body mass index is 49.65 kg/m as calculated from the following:   Height as of this encounter: 5' 11 (1.803 m).   Weight as of this encounter: 161.5 kg.  Will continue to follow-up as she remained inpatient.  Procedures: C Section.  Antimicrobials:  Anti-infectives (From admission, onward)    Start     Dose/Rate Route Frequency Ordered Stop   04/20/24 0825  ceFAZolin  (ANCEF ) IVPB 3g/150 mL premix        3 g 300 mL/hr over 30 Minutes Intravenous 30 min pre-op 04/20/24 0827 04/20/24 0958       Subjective: Patient was seen and examined at bedside.  Patient is status post C-section.  POD #2.   She reports feeling better,  She was lying comfortably in the bed  Objective: Vitals:   04/21/24 1927 04/21/24 2343 04/22/24 0431 04/22/24 0825  BP: 129/86 (!) 158/104 (!) 116/56 112/75  Pulse: 66 76 66 (!) 56  Resp: 16 17 18 16   Temp: (!) 97.5 F (36.4 C) 97.6 F (36.4 C) 98.3 F (36.8 C) 98.2 F (36.8 C)  TempSrc: Oral Oral Oral Oral  SpO2: 100% 97% 96%   Weight:      Height:        Intake/Output Summary (Last 24 hours) at 04/22/2024 1228 Last data filed at 04/21/2024 2343 Gross per 24 hour  Intake 480 ml  Output 1100 ml  Net -620 ml   Filed Weights   03/24/24 1417  Weight: (!) 161.5 kg    Examination:  General exam: Appears calm and comfortable, not in any acute distress. Respiratory system: CTA Bilaterally.  Respiratory effort normal. RR 15 Cardiovascular system: S1 & S2 heard, RRR. No JVD, murmurs, rubs, gallops or clicks.  Gastrointestinal system: Abdomen is nondistended, soft and nontender.  Normal bowel sounds heard. Central nervous system: Alert and oriented x 3. No focal neurological deficits. Extremities: Edema+, No cyanosis , no clubbing. Skin: No rashes, lesions or ulcers Psychiatry: Judgement and insight appear normal. Mood & affect  appropriate.     Data Reviewed: I have personally reviewed following labs and imaging studies  CBC: Recent Labs  Lab 04/17/24 1116 04/18/24 0455 04/20/24 0831 04/21/24 0527  WBC 6.7 8.0 13.3* 12.2*  NEUTROABS  --  5.5  --   --   HGB 9.9* 10.2* 9.8* 9.2*  HCT 31.7* 32.1* 31.2* 29.5*  MCV 82.6 82.1 82.5 82.6  PLT 196 204 220 214   Basic Metabolic Panel: Recent Labs  Lab 04/18/24 0455 04/19/24 0526 04/20/24 0558 04/20/24 1319 04/21/24 0527 04/22/24 0538  NA 137 136 137  --  133* 138  K 3.9 4.4 4.2  --  4.1 4.2  CL 109 110 109  --  105 107  CO2 21* 19* 22  --  24 23  GLUCOSE 92 124* 124*  --  105* 90  BUN <5* 5* 6  --  9 18  CREATININE 0.68 0.70 0.90 0.88 0.90 1.02*  CALCIUM  13.7* 12.1* 12.9*  --  11.8* 12.6*  MG 1.6* 1.6* 1.6*  --  4.7* 1.9   GFR: Estimated Creatinine Clearance: 128.9 mL/min (A) (by C-G formula based on SCr of 1.02 mg/dL (H)). Liver Function Tests: Recent Labs  Lab 04/18/24 0455 04/19/24 0526 04/20/24 0558 04/21/24 0527 04/22/24 0538  AST  --  16 22 23 21   ALT  --  12 12 11 13   ALKPHOS  --  74 80 66 61  BILITOT  --  0.7 0.6 0.8 0.3  PROT  --  5.7* 6.4* 5.1* 5.1*  ALBUMIN 2.4* 2.5* 2.8* 2.3* 2.2*   No results for input(s): LIPASE, AMYLASE in the last 168 hours. No results for input(s): AMMONIA in the last 168 hours. Coagulation Profile: No results for input(s): INR, PROTIME in the last 168 hours. Cardiac Enzymes: No results for input(s): CKTOTAL, CKMB, CKMBINDEX, TROPONINI in the last 168 hours. BNP (last 3 results) No results for input(s): PROBNP in the last 8760 hours. HbA1C: No results for input(s): HGBA1C in the last 72 hours. CBG: No results for input(s): GLUCAP in the last 168 hours. Lipid Profile: No results for input(s): CHOL, HDL, LDLCALC, TRIG, CHOLHDL, LDLDIRECT in the last 72 hours. Thyroid  Function Tests: No results for input(s): TSH, T4TOTAL, FREET4, T3FREE, THYROIDAB in the  last 72 hours. Anemia Panel: No results for input(s): VITAMINB12, FOLATE, FERRITIN, TIBC, IRON , RETICCTPCT in the last 72 hours. Sepsis Labs: No results for input(s): PROCALCITON, LATICACIDVEN in the last 168 hours.  No results found for this or any previous visit (from the past 240 hours).   Radiology Studies: US  THYROID  Result Date: 04/21/2024 CLINICAL DATA:  Other.  Hyperparathyroidism. EXAM: THYROID  ULTRASOUND TECHNIQUE: Ultrasound examination of the thyroid  gland and adjacent soft tissues was performed. COMPARISON:  04/11/2024 FINDINGS: Parenchymal Echotexture: Normal Isthmus: 1.2 cm Right lobe: 5.3 x 2.4 x 2.0 cm Left lobe: 5.0 x 2.3 x 2.1 cm _________________________________________________________ Estimated total number of nodules >/= 1 cm: 0 Number of spongiform nodules >/=  2 cm not described below (TR1): 0 Number of mixed cystic and solid nodules >/= 1.5 cm not described below (TR2): 0 _________________________________________________________  Stable 0.6 cm nodule in the superior left lobe that does not meet criteria for biopsy or follow-up. Stable 0.6 cm nodule in the isthmus that does not meet criteria for biopsy or follow-up. No enlarged or abnormal appearing lymph nodes are identified. No abnormal parathyroid  nodules identified. IMPRESSION: Stable 0.6 cm thyroid  nodules in the left lobe and isthmus that do not meet criteria for biopsy or follow-up. No abnormal parathyroid  nodules identified. Electronically Signed   By: Marcey Moan M.D.   On: 04/21/2024 15:58    Scheduled Meds:  acetaminophen   1,000 mg Oral Q6H   enoxaparin  (LOVENOX ) injection  40 mg Subcutaneous Q12H   ibuprofen   600 mg Oral Q6H   labetalol   100 mg Oral Q8H   magnesium  oxide  400 mg Oral QID   pneumococcal 20-valent conjugate vaccine  0.5 mL Intramuscular Tomorrow-1000   potassium chloride   40 mEq Oral TID   prenatal multivitamin  1 tablet Oral Q1200   senna-docusate  2 tablet Oral Daily    simethicone   80 mg Oral TID PC   Continuous Infusions:  sodium chloride  Stopped (04/16/24 1130)     LOS: 29 days    Time spent: 35 mins    Darcel Dawley, MD [facility]   If 7PM-7AM, please contact night-coverage

## 2024-04-22 NOTE — Progress Notes (Signed)
 Assessment & Plan: Primary hyperparathyroidism - unsuppressed PTH level  Calcium  12.6 mg/dl this AM  Symptomatically improved  USN of thyroid  essentially normal - no enlarged parathyroid  gland identified  Sestamibi canceled - patient weight is limiting factor  Appreciate input by Dr. Faythe with medical options for temporary management  Will order 4D-CT scan with parathyroid  protocol for localization of adenoma  Patient seen and evaluated this morning.  Medical records reviewed.  Ultrasound reviewed.  Appreciate note from Dr. Chyrl Faythe from endocrinology.  We discussed primary hyperparathyroidism.  We discussed parathyroid  surgery, both minimally invasive and traditional neck exploration.  Patient appears to have primary hyperparathyroidism with marked hypercalcemia and an unsuppressed intact PTH level.  Unfortunately ultrasound examination did not localize a parathyroid  adenoma.  Apparently there is a weight limit for performing sestamibi scan.  I am going to order a 4D CT scan of the neck with parathyroid  protocol in hopes of localizing the adenoma prior to surgery.  Patient is unsure of the length of her hospital stay.  The earliest date of discharge may be tomorrow.  If so, she certainly could be safely managed medically until we could obtain the remaining imaging studies.  If the 4D CT scan can be obtained properly, it is possible that we could place her on the operating room schedule either Monday or Tuesday of this coming week.  I will order the CT scan and follow-up on the results when they are available.  We will make plans for further management at that time.        [name], MD [facility] practice Office: [phone]        Chief Complaint: hypercalcemia  Subjective: Patient in bed, family in room.  Comfortable.  Brain fog improved.  Objective: Vital signs in last 24 hours: Temp:  [97.5 F (36.4 C)-98.3 F (36.8 C)] 98.2 F (36.8 C)  (06/27 0825) Pulse Rate:  [49-76] 56 (06/27 0825) Resp:  [16-18] 16 (06/27 0825) BP: (112-158)/(56-104) 112/75 (06/27 0825) SpO2:  [96 %-100 %] 96 % (06/27 0431) Last BM Date : 04/22/24  Intake/Output from previous day: 06/26 0701 - 06/27 0700 In: 709.9 [P.O.:480; I.V.:229.9] Out: 2100 [Urine:2100] Intake/Output this shift: No intake/output data recorded.  Physical Exam: HEENT - sclerae clear, mucous membranes moist Neck - symmetric, normal thyroid  exam without nodules, no lymphadenopathy  Lab Results:  Recent Labs    04/20/24 0831 04/21/24 0527  WBC 13.3* 12.2*  HGB 9.8* 9.2*  HCT 31.2* 29.5*  PLT 220 214   BMET Recent Labs    04/21/24 0527 04/22/24 0538  NA 133* 138  K 4.1 4.2  CL 105 107  CO2 24 23  GLUCOSE 105* 90  BUN 9 18  CREATININE 0.90 1.02*  CALCIUM  11.8* 12.6*   PT/INR No results for input(s): LABPROT, INR in the last 72 hours. Comprehensive Metabolic Panel:    Component Value Date/Time   NA 138 04/22/2024 0538   NA 133 (L) 04/21/2024 0527   K 4.2 04/22/2024 0538   K 4.1 04/21/2024 0527   CL 107 04/22/2024 0538   CL 105 04/21/2024 0527   CO2 23 04/22/2024 0538   CO2 24 04/21/2024 0527   BUN 18 04/22/2024 0538   BUN 9 04/21/2024 0527   CREATININE 1.02 (H) 04/22/2024 0538   CREATININE 0.90 04/21/2024 0527   GLUCOSE 90 04/22/2024 0538   GLUCOSE 105 (H) 04/21/2024 0527   CALCIUM  12.6 (H) 04/22/2024 0538   CALCIUM  11.8 (H)  04/21/2024 0527   CALCIUM  10.5 (H) 04/11/2024 0853   AST 21 04/22/2024 0538   AST 23 04/21/2024 0527   ALT 13 04/22/2024 0538   ALT 11 04/21/2024 0527   ALKPHOS 61 04/22/2024 0538   ALKPHOS 66 04/21/2024 0527   BILITOT 0.3 04/22/2024 0538   BILITOT 0.8 04/21/2024 0527   PROT 5.1 (L) 04/22/2024 0538   PROT 5.1 (L) 04/21/2024 0527   ALBUMIN 2.2 (L) 04/22/2024 0538   ALBUMIN 2.3 (L) 04/21/2024 0527    Studies/Results: US  THYROID  Result Date: 04/21/2024 CLINICAL DATA:  Other.  Hyperparathyroidism. EXAM: THYROID   ULTRASOUND TECHNIQUE: Ultrasound examination of the thyroid  gland and adjacent soft tissues was performed. COMPARISON:  04/11/2024 FINDINGS: Parenchymal Echotexture: Normal Isthmus: 1.2 cm Right lobe: 5.3 x 2.4 x 2.0 cm Left lobe: 5.0 x 2.3 x 2.1 cm _________________________________________________________ Estimated total number of nodules >/= 1 cm: 0 Number of spongiform nodules >/=  2 cm not described below (TR1): 0 Number of mixed cystic and solid nodules >/= 1.5 cm not described below (TR2): 0 _________________________________________________________ Stable 0.6 cm nodule in the superior left lobe that does not meet criteria for biopsy or follow-up. Stable 0.6 cm nodule in the isthmus that does not meet criteria for biopsy or follow-up. No enlarged or abnormal appearing lymph nodes are identified. No abnormal parathyroid  nodules identified. IMPRESSION: Stable 0.6 cm thyroid  nodules in the left lobe and isthmus that do not meet criteria for biopsy or follow-up. No abnormal parathyroid  nodules identified. Electronically Signed   By: Marcey Moan M.D.   On: 04/21/2024 15:58      [name] 04/22/2024  Patient ID: Beth [name], female   DOB: 12-22-1985, 38 y.o.   MRN: [id number]

## 2024-04-23 LAB — COMPREHENSIVE METABOLIC PANEL WITH GFR
ALT: 11 U/L (ref 0–44)
AST: 15 U/L (ref 15–41)
Albumin: 2.2 g/dL — ABNORMAL LOW (ref 3.5–5.0)
Alkaline Phosphatase: 60 U/L (ref 38–126)
Anion gap: 4 — ABNORMAL LOW (ref 5–15)
BUN: 19 mg/dL (ref 6–20)
CO2: 25 mmol/L (ref 22–32)
Calcium: 11.5 mg/dL — ABNORMAL HIGH (ref 8.9–10.3)
Chloride: 107 mmol/L (ref 98–111)
Creatinine, Ser: 0.88 mg/dL (ref 0.44–1.00)
GFR, Estimated: >60 mL/min (ref >60)
Glucose, Bld: 96 mg/dL (ref 70–99)
Potassium: 4.5 mmol/L (ref 3.5–5.1)
Sodium: 136 mmol/L (ref 135–145)
Total Bilirubin: 0.7 mg/dL (ref 0.0–1.2)
Total Protein: 4.9 g/dL — ABNORMAL LOW (ref 6.5–8.1)

## 2024-04-23 LAB — TSH: TSH: 1.959 u[IU]/mL (ref 0.350–4.500)

## 2024-04-23 LAB — VITAMIN D 25 HYDROXY (VIT D DEFICIENCY, FRACTURES): Vit D, 25-Hydroxy: 50.21 ng/mL (ref 30–100)

## 2024-04-23 LAB — MAGNESIUM: Magnesium: 1.5 mg/dL — ABNORMAL LOW (ref 1.7–2.4)

## 2024-04-23 LAB — PHOSPHORUS: Phosphorus: 3.4 mg/dL (ref 2.5–4.6)

## 2024-04-23 MED ORDER — MAGNESIUM SULFATE 2 GM/50ML IV SOLN
2.0000 g | Freq: Once | INTRAVENOUS | Status: AC
  Administered 2024-04-23: 2 g via INTRAVENOUS
  Filled 2024-04-23: qty 50

## 2024-04-23 NOTE — Progress Notes (Signed)
 Assessment & Plan: Primary hyperparathyroidism - unsuppressed PTH level             Calcium  11.5 mg/dl this AM             USN of thyroid  essentially normal - no enlarged parathyroid  gland identified             Sestamibi canceled - patient weight is limiting factor             4D-CT scan negative for enlarged parathyroid  gland  Lengthy discussion with patient and family regarding results of ultrasound and 4D CT scan.  Discussed options for further evaluation.  Given some weight loss following delivery of baby, patient may be a candidate to undergo nuclear medicine imaging with sestamibi at some point in the near future.  Otherwise, recommendation would be to proceed with neck exploration.  We discussed this operation today in detail.  We discussed the size and location of the surgical incision.  We discussed the risk of the surgery including the risk of recurrent nerve injury.  We discussed the possibility of an ectopic parathyroid  gland that would not be identified at the time of exploration.  After consideration, the patient would like to remain in the hospital and proceed with surgery.  I will look for operating room time on Monday and Tuesday and see if we can get her in the schedule for neck exploration and parathyroidectomy.  I will notify the patient once we have a time slot available.        [name], MD [facility] practice Office: [phone]        Chief Complaint: hyperparathyroidism  Subjective: Patient in bed, family at bedside.  Comfortable.  Objective: Vital signs in last 24 hours: Temp:  [97.9 F (36.6 C)-98.5 F (36.9 C)] 97.9 F (36.6 C) (06/28 0825) Pulse Rate:  [56-79] 56 (06/28 0825) Resp:  [16-20] 20 (06/28 0825) BP: (137-153)/(60-90) 138/65 (06/28 0825) SpO2:  [99 %-100 %] 99 % (06/28 0825) Last BM Date : 04/22/24  Intake/Output from previous day: No intake/output data recorded. Intake/Output this shift: No intake/output  data recorded.  Physical Exam: HEENT - sclerae clear, mucous membranes moist Neck - soft, symmetric, no prior surgery   Lab Results:  Recent Labs    04/21/24 0527  WBC 12.2*  HGB 9.2*  HCT 29.5*  PLT 214   BMET Recent Labs    04/22/24 0538 04/23/24 0426  NA 138 136  K 4.2 4.5  CL 107 107  CO2 23 25  GLUCOSE 90 96  BUN 18 19  CREATININE 1.02* 0.88  CALCIUM  12.6* 11.5*   PT/INR No results for input(s): LABPROT, INR in the last 72 hours. Comprehensive Metabolic Panel:    Component Value Date/Time   NA 136 04/23/2024 0426   NA 138 04/22/2024 0538   K 4.5 04/23/2024 0426   K 4.2 04/22/2024 0538   CL 107 04/23/2024 0426   CL 107 04/22/2024 0538   CO2 25 04/23/2024 0426   CO2 23 04/22/2024 0538   BUN 19 04/23/2024 0426   BUN 18 04/22/2024 0538   CREATININE 0.88 04/23/2024 0426   CREATININE 1.02 (H) 04/22/2024 0538   GLUCOSE 96 04/23/2024 0426   GLUCOSE 90 04/22/2024 0538   CALCIUM  11.5 (H) 04/23/2024 0426   CALCIUM  12.6 (H) 04/22/2024 0538   CALCIUM  10.5 (H) 04/11/2024 0853   AST 15 04/23/2024 0426   AST 21 04/22/2024 0538   ALT 11 04/23/2024  0426   ALT 13 04/22/2024 0538   ALKPHOS 60 04/23/2024 0426   ALKPHOS 61 04/22/2024 0538   BILITOT 0.7 04/23/2024 0426   BILITOT 0.3 04/22/2024 0538   PROT 4.9 (L) 04/23/2024 0426   PROT 5.1 (L) 04/22/2024 0538   ALBUMIN 2.2 (L) 04/23/2024 0426   ALBUMIN 2.2 (L) 04/22/2024 0538    Studies/Results: CT PARATHYROID  4D NECK W/WO Result Date: 04/22/2024 CLINICAL DATA:  Hyperparathyroidism EXAM: CT NECK WITH AND WITHOUT CONTRAST TECHNIQUE: Multidetector CT imaging of the mid and lower neck was performed without and with intravenous contrast. RADIATION DOSE REDUCTION: This exam was performed according to the departmental dose-optimization program which includes automated exposure control, adjustment of the mA and/or kV according to patient size and/or use of iterative reconstruction technique. CONTRAST:  75mL OMNIPAQUE   IOHEXOL  350 MG/ML SOLN COMPARISON:  Ultrasound of the thyroid  dated April 21, 2024. FINDINGS: Pharynx and larynx: The hypopharyngeal and laryngeal soft tissues are unremarkable. The visualized oral cavity and oropharynx also appear normal. Salivary glands: The submandibular glands appear normal. The parotid glands are only partially visualized. Thyroid : Normal in size and morphology. There is no discrete nodule within of or adjacent to the thyroid  to suggest the presence of a parathyroid  adenoma. Lymph nodes: None enlarged or abnormal density. Vascular: Negative. Limited intracranial: Not visualized. Visualized orbits: Not visualized. Mastoids and visualized paranasal sinuses: Not visualized. Skeleton: No acute or aggressive process. Upper chest: Negative. Other: None. IMPRESSION: 1. No evidence of parathyroid  nodule. Electronically Signed   By: Evalene Coho M.D.   On: 04/22/2024 14:27   US  THYROID  Result Date: 04/21/2024 CLINICAL DATA:  Other.  Hyperparathyroidism. EXAM: THYROID  ULTRASOUND TECHNIQUE: Ultrasound examination of the thyroid  gland and adjacent soft tissues was performed. COMPARISON:  04/11/2024 FINDINGS: Parenchymal Echotexture: Normal Isthmus: 1.2 cm Right lobe: 5.3 x 2.4 x 2.0 cm Left lobe: 5.0 x 2.3 x 2.1 cm _________________________________________________________ Estimated total number of nodules >/= 1 cm: 0 Number of spongiform nodules >/=  2 cm not described below (TR1): 0 Number of mixed cystic and solid nodules >/= 1.5 cm not described below (TR2): 0 _________________________________________________________ Stable 0.6 cm nodule in the superior left lobe that does not meet criteria for biopsy or follow-up. Stable 0.6 cm nodule in the isthmus that does not meet criteria for biopsy or follow-up. No enlarged or abnormal appearing lymph nodes are identified. No abnormal parathyroid  nodules identified. IMPRESSION: Stable 0.6 cm thyroid  nodules in the left lobe and isthmus that do not meet  criteria for biopsy or follow-up. No abnormal parathyroid  nodules identified. Electronically Signed   By: Marcey Moan M.D.   On: 04/21/2024 15:58      [name] 04/23/2024  Patient ID: Beth [name], female   DOB: 1986/02/26, 38 y.o.   MRN: [id number]

## 2024-04-23 NOTE — Lactation Note (Signed)
 This note was copied from a baby's chart.  NICU Lactation Consultation Note  Patient Name: Beth [name] Unijb'd Date: 04/23/2024 Age:37 hours  Reason for consult: Follow-up assessment; NICU baby; Late-preterm 34-36.6wks; Maternal endocrine disorder Type of Endocrine Disorder?: Thyroid  (maternal hyperparathyroid, IVF pregnancy, AMA)  SUBJECTIVE  LC in to visit with P4 Mom of baby Beth [name] in the NICU.  Mom holding baby while baby receiving his gavage feeding.  LC offered to help with STS, but Mom declined politely.  She states she will do STS at next feeding session.  Mom states she has tried to pump consistently, estimating 5-6 times last 24 hrs.  Mom expressed 14 ml last time.  Mom denies any pain with pumping.    Mom is not being discharged for a few days due to her hyperparathyroid problem and probably surgery early next week.    Mom aware of pump in baby's room that she can use as well as the pump in her room on OBSC.  Mom would like to try to breastfeed baby as she was told she could.  LC offered to assist at baby's 11 am feeding tomorrow.  Mom was pleased and said yes.  Reminded Mom that STS on her chest was first stage of breastfeeding.    OBJECTIVE Infant data: No data recorded O2 Device: Room Air  Infant feeding assessment IDFTS - Readiness: 3   Maternal data: [id number] C-Section, Low Transverse Pumping frequency: 5-6 times per 24 hrs Pumped volume: 14 mL Flange Size: 21  WIC Program: No WIC Referral Sent?: No Pump: Hands Free, Personal (Mom[employer] from insurance)  ASSESSMENT Infant:  Feeding Status: Scheduled 8-11-2-5 Feeding method: Tube/Gavage (Bolus)  Maternal: Milk volume: Normal  INTERVENTIONS/PLAN Interventions: Interventions: Breast feeding basics reviewed; Skin to skin; Breast massage; Hand express; DEBP; Education Discharge Education: Engorgement and breast care Tools: Pump; Flanges Pump Education: Setup, frequency, and cleaning; Milk  Storage  Plan: Consult Status: NICU follow-up NICU Follow-up type: Verify onset of copious milk; Verify absence of engorgement   [name] 04/23/2024, 5:37 PM

## 2024-04-23 NOTE — Progress Notes (Signed)
 PROGRESS NOTE    Beth [name]  FMW:[id number] DOB: 1986/05/13 DOA: 03/24/2024 PCP: Patient, No Pcp Per   Brief Narrative:  This 38 yrs old female G4P3 at [redacted] weeks GA with PMH significant for anxiety,  previous history of hypothyroidism, family history of renal potassium wasting, Patient was admitted on 5/29 with preeclampsia by OB.  We were consulted on 6/01 for hypokalemia.  She was treated with magnesium  and potassium supplements.  Her potassium has now remained in a good range.  Blood pressure has been better controlled.  Plan is to proceed with delivery at 34 weeks.  Patient has developed hypercalcemia.  We were consulted on 6/14  to assist with management. Calcium  level has been ranging around 10.5----has been slowly increasing since 6/09 ---to 11----subsequently 13.6--- Calcium  correction by albumin 14.2 Patient s/p C section 6/25.POD # 2.   Assessment & Plan:   Active Problems:   * No active hospital problems. *  Hypercalcemia: Primary hyperparathyroidism: PTH: 46, PTH like peptide pending, Vitamin D : 61, TSH. 0.6 Dr. Faythe Endocrinologist on board, Patient appears to have primary hyperparathyroidism,  24-hour urine calcium  excludes familial and acquired hypocalciuric hypercalcemia. Continue IV Fluids General Surgery consulted, plan for parathyroid  surgery postdelivery. Continue to monitor calcium  levels. Dr. Faythe had detailed discussion with the patient regarding treatment for hypercalcemia. Patient would continue to drink plenty of water  and weightbearing exercises. General surgery scheduled patient for neck exploration and parathyroidectomy on Monday.   Preeclampsia: Status post repeat Low transverse C-section without extension. Treatment as per OB/GYN.   Left Ventricle Hypertrophy on EKG; ECHO Ef 55% to 60 % There is mild concentric left ventricular hypertrophy.    Hypomagnesemia: Replete IV as needed.   Estimated body mass index is 49.65 kg/m as calculated from the  following:   Height as of this encounter: 5' 11 (1.803 m).   Weight as of this encounter: 161.5 kg.  Will continue to follow-up as she remained inpatient.  Procedures: C Section.  Antimicrobials:  Anti-infectives (From admission, onward)    Start     Dose/Rate Route Frequency Ordered Stop   04/20/24 0825  ceFAZolin  (ANCEF ) IVPB 3g/150 mL premix        3 g 300 mL/hr over 30 Minutes Intravenous 30 min pre-op 04/20/24 0827 04/20/24 0958       Subjective: Patient was seen and examined at bedside.  Patient is status post C-section.  POD # 3.   She reports feeling better,  She was lying comfortably in the bed. Patient is tentatively scheduled for neck exploration and parathyroidectomy on Monday.  Objective: Vitals:   04/22/24 2329 04/23/24 0533 04/23/24 0825 04/23/24 1200  BP: 139/60 137/65 138/65 (!) 118/58  Pulse: 78 63 (!) 56 70  Resp: 18  20 16   Temp: 98.4 F (36.9 C)  97.9 F (36.6 C) 98.2 F (36.8 C)  TempSrc: Oral  Oral Oral  SpO2: 100%  99% 99%  Weight:      Height:       No intake or output data in the 24 hours ending 04/23/24 1307  Filed Weights   03/24/24 1417  Weight: (!) 161.5 kg    Examination:  General exam: Appears calm and comfortable, not in any acute distress. Respiratory system: CTA Bilaterally. Respiratory effort normal. RR 15 Cardiovascular system: S1 & S2 heard, RRR. No JVD, murmurs, rubs, gallops or clicks.  Gastrointestinal system: Abdomen is nondistended, soft and nontender.  Normal bowel sounds heard. Central nervous system: Alert and oriented x 3.  No focal neurological deficits. Extremities: Edema+, No cyanosis , no clubbing. Skin: No rashes, lesions or ulcers Psychiatry: Judgement and insight appear normal. Mood & affect appropriate.     Data Reviewed: I have personally reviewed following labs and imaging studies  CBC: Recent Labs  Lab 04/17/24 1116 04/18/24 0455 04/20/24 0831 04/21/24 0527  WBC 6.7 8.0 13.3* 12.2*   NEUTROABS  --  5.5  --   --   HGB 9.9* 10.2* 9.8* 9.2*  HCT 31.7* 32.1* 31.2* 29.5*  MCV 82.6 82.1 82.5 82.6  PLT 196 204 220 214   Basic Metabolic Panel: Recent Labs  Lab 04/19/24 0526 04/20/24 0558 04/20/24 1319 04/21/24 0527 04/22/24 0538 04/23/24 0426  NA 136 137  --  133* 138 136  K 4.4 4.2  --  4.1 4.2 4.5  CL 110 109  --  105 107 107  CO2 19* 22  --  24 23 25   GLUCOSE 124* 124*  --  105* 90 96  BUN 5* 6  --  9 18 19   CREATININE 0.70 0.90 0.88 0.90 1.02* 0.88  CALCIUM  12.1* 12.9*  --  11.8* 12.6* 11.5*  MG 1.6* 1.6*  --  4.7* 1.9 1.5*  PHOS  --   --   --   --   --  3.4   GFR: Estimated Creatinine Clearance: 149.4 mL/min (by C-G formula based on SCr of 0.88 mg/dL). Liver Function Tests: Recent Labs  Lab 04/19/24 0526 04/20/24 0558 04/21/24 0527 04/22/24 0538 04/23/24 0426  AST 16 22 23 21 15   ALT 12 12 11 13 11   ALKPHOS 74 80 66 61 60  BILITOT 0.7 0.6 0.8 0.3 0.7  PROT 5.7* 6.4* 5.1* 5.1* 4.9*  ALBUMIN 2.5* 2.8* 2.3* 2.2* 2.2*   No results for input(s): LIPASE, AMYLASE in the last 168 hours. No results for input(s): AMMONIA in the last 168 hours. Coagulation Profile: No results for input(s): INR, PROTIME in the last 168 hours. Cardiac Enzymes: No results for input(s): CKTOTAL, CKMB, CKMBINDEX, TROPONINI in the last 168 hours. BNP (last 3 results) No results for input(s): PROBNP in the last 8760 hours. HbA1C: No results for input(s): HGBA1C in the last 72 hours. CBG: No results for input(s): GLUCAP in the last 168 hours. Lipid Profile: No results for input(s): CHOL, HDL, LDLCALC, TRIG, CHOLHDL, LDLDIRECT in the last 72 hours. Thyroid  Function Tests: Recent Labs    04/23/24 0426  TSH 1.959   Anemia Panel: No results for input(s): VITAMINB12, FOLATE, FERRITIN, TIBC, IRON , RETICCTPCT in the last 72 hours. Sepsis Labs: No results for input(s): PROCALCITON, LATICACIDVEN in the last 168 hours.  No  results found for this or any previous visit (from the past 240 hours).   Radiology Studies: CT PARATHYROID  4D NECK W/WO Result Date: 04/22/2024 CLINICAL DATA:  Hyperparathyroidism EXAM: CT NECK WITH AND WITHOUT CONTRAST TECHNIQUE: Multidetector CT imaging of the mid and lower neck was performed without and with intravenous contrast. RADIATION DOSE REDUCTION: This exam was performed according to the departmental dose-optimization program which includes automated exposure control, adjustment of the mA and/or kV according to patient size and/or use of iterative reconstruction technique. CONTRAST:  75mL OMNIPAQUE  IOHEXOL  350 MG/ML SOLN COMPARISON:  Ultrasound of the thyroid  dated April 21, 2024. FINDINGS: Pharynx and larynx: The hypopharyngeal and laryngeal soft tissues are unremarkable. The visualized oral cavity and oropharynx also appear normal. Salivary glands: The submandibular glands appear normal. The parotid glands are only partially visualized. Thyroid : Normal in size and morphology.  There is no discrete nodule within of or adjacent to the thyroid  to suggest the presence of a parathyroid  adenoma. Lymph nodes: None enlarged or abnormal density. Vascular: Negative. Limited intracranial: Not visualized. Visualized orbits: Not visualized. Mastoids and visualized paranasal sinuses: Not visualized. Skeleton: No acute or aggressive process. Upper chest: Negative. Other: None. IMPRESSION: 1. No evidence of parathyroid  nodule. Electronically Signed   By: Evalene Coho M.D.   On: 04/22/2024 14:27   US  THYROID  Result Date: 04/21/2024 CLINICAL DATA:  Other.  Hyperparathyroidism. EXAM: THYROID  ULTRASOUND TECHNIQUE: Ultrasound examination of the thyroid  gland and adjacent soft tissues was performed. COMPARISON:  04/11/2024 FINDINGS: Parenchymal Echotexture: Normal Isthmus: 1.2 cm Right lobe: 5.3 x 2.4 x 2.0 cm Left lobe: 5.0 x 2.3 x 2.1 cm _________________________________________________________ Estimated total  number of nodules >/= 1 cm: 0 Number of spongiform nodules >/=  2 cm not described below (TR1): 0 Number of mixed cystic and solid nodules >/= 1.5 cm not described below (TR2): 0 _________________________________________________________ Stable 0.6 cm nodule in the superior left lobe that does not meet criteria for biopsy or follow-up. Stable 0.6 cm nodule in the isthmus that does not meet criteria for biopsy or follow-up. No enlarged or abnormal appearing lymph nodes are identified. No abnormal parathyroid  nodules identified. IMPRESSION: Stable 0.6 cm thyroid  nodules in the left lobe and isthmus that do not meet criteria for biopsy or follow-up. No abnormal parathyroid  nodules identified. Electronically Signed   By: Marcey Moan M.D.   On: 04/21/2024 15:58    Scheduled Meds:  acetaminophen   1,000 mg Oral Q6H   enoxaparin  (LOVENOX ) injection  40 mg Subcutaneous Q12H   ibuprofen   600 mg Oral Q6H   labetalol   200 mg Oral Q8H   magnesium  oxide  400 mg Oral QID   potassium chloride   40 mEq Oral TID   prenatal multivitamin  1 tablet Oral Q1200   senna-docusate  2 tablet Oral Daily   simethicone   80 mg Oral TID PC   Continuous Infusions:  sodium chloride  Stopped (04/16/24 1130)     LOS: 30 days    Time spent: 35 mins    Darcel Dawley, MD [facility]   If 7PM-7AM, please contact night-coverage

## 2024-04-23 NOTE — Progress Notes (Signed)
 Subjective: Postpartum Day 3: Cesarean Delivery Patient reports feeling well this AM. Reports incisional pain with ambulation, was trying to avoid oxycodone  but did have some relief with it last night. Has ambulated and voided. Passing gas. Tolerating PO. Lochia minimal. Denies headache, vision changes, chest pain, SOB, RUQ pain. Baby boy is doing well in the NICU. She is pumping, no issues. Saw Endocrinology and Surgery teams this AM, planning neck exploration and parathyroidectomy on Monday or Tuesday.  Objective: Vital signs in last 24 hours: Temp:  [97.9 F (36.6 C)-98.5 F (36.9 C)] 98.2 F (36.8 C) (06/28 1200) Pulse Rate:  [56-79] 70 (06/28 1200) Resp:  [16-20] 16 (06/28 1200) BP: (118-153)/(58-90) 118/58 (06/28 1200) SpO2:  [99 %-100 %] 99 % (06/28 1200)  Physical Exam:  General: alert, cooperative, and no distress Lochia: appropriate Uterine Fundus: firm Incision: Prevena in place with no output, abdomen is soft, non-tender DVT Evaluation: No evidence of DVT seen on physical exam.  Recent Labs    04/21/24 0527  HGB 9.2*  HCT 29.5*    Assessment/Plan: Irianna Gilday is a 38 year old G4 now P4 s/p repeat C-section at [redacted]w[redacted]d for pre-eclampsia with severe features and breech presentation. POD@3  today -POD#3: doing well, continue routine post-op/postpartum care. Prevena in place -pre-eclampsia w/ SF: Currently asx. Mag completed at 10:30 POD#1. Poor tolerance of Nifedipine . Currently on labetalol  200mg  TID, titrate PRN -Primary hyperparathyroidism with associated electrolyte abnormalities: Internal medicine and endocrine following. General surgery consulted, planning neck exploration and parathyroidectomy Monday or Tuesday   -hypercalcemia: Ca 11.5 today, management per medicine/endocrine. -hypokalemia: likely linked with hyperparathyroidism per Endo. Potassium still currently in normal range on potassium 40 mEq TID. 4.5 today -hypomagnesemia: also likely linked with  hyperparathyroidism per Endo. Mag today at 1.5, 1 dose of IV mag 2g ordered, continue PO supplementation. -anemia: post-op Hgb stable at 9.2. Consider PO iron  at discharge. -AKI, resolved: Cr 0.88 this AM (from 1.02).   Dispo: Continue inpatient management as above.  [name], MD 04/23/2024, 12:05 PM

## 2024-04-23 NOTE — Progress Notes (Signed)
 Subjective: [name] reports feeling well today.  No brain fog, generalized weakness, or other hypercalcemia symptom now.  She notes that her newborn son's blood level of calcium  remains normal.    Objective: Vital signs in last 24 hours: Temp:  [97.9 F (36.6 C)-98.5 F (36.9 C)] 97.9 F (36.6 C) (06/28 0825) Pulse Rate:  [56-79] 56 (06/28 0825) Resp:  [16-20] 20 (06/28 0825) BP: (137-153)/(60-90) 138/65 (06/28 0825) SpO2:  [99 %-100 %] 99 % (06/28 0825) Weight change:  Last BM Date : 04/22/24  Intake/Output from previous day: No intake/output data recorded. Intake/Output this shift: No intake/output data recorded.  General appearance: alert and cooperative  Lab Results: Recent Labs    04/21/24 0527  WBC 12.2*  HGB 9.2*  HCT 29.5*  PLT 214   BMET Recent Labs    04/22/24 0538 04/23/24 0426  NA 138 136  K 4.2 4.5  CL 107 107  CO2 23 25  GLUCOSE 90 96  BUN 18 19  CREATININE 1.02* 0.88  CALCIUM  12.6* 11.5*    Studies/Results: CT PARATHYROID  4D NECK W/WO Result Date: 04/22/2024 CLINICAL DATA:  Hyperparathyroidism EXAM: CT NECK WITH AND WITHOUT CONTRAST TECHNIQUE: Multidetector CT imaging of the mid and lower neck was performed without and with intravenous contrast. RADIATION DOSE REDUCTION: This exam was performed according to the departmental dose-optimization program which includes automated exposure control, adjustment of the mA and/or kV according to patient size and/or use of iterative reconstruction technique. CONTRAST:  75mL OMNIPAQUE  IOHEXOL  350 MG/ML SOLN COMPARISON:  Ultrasound of the thyroid  dated April 21, 2024. FINDINGS: Pharynx and larynx: The hypopharyngeal and laryngeal soft tissues are unremarkable. The visualized oral cavity and oropharynx also appear normal. Salivary glands: The submandibular glands appear normal. The parotid glands are only partially visualized. Thyroid : Normal in size and morphology. There is no discrete nodule within of or adjacent  to the thyroid  to suggest the presence of a parathyroid  adenoma. Lymph nodes: None enlarged or abnormal density. Vascular: Negative. Limited intracranial: Not visualized. Visualized orbits: Not visualized. Mastoids and visualized paranasal sinuses: Not visualized. Skeleton: No acute or aggressive process. Upper chest: Negative. Other: None. IMPRESSION: 1. No evidence of parathyroid  nodule. Electronically Signed   By: Beth [name] M.D.   On: 04/22/2024 14:27   US  THYROID  Result Date: 04/21/2024 CLINICAL DATA:  Other.  Hyperparathyroidism. EXAM: THYROID  ULTRASOUND TECHNIQUE: Ultrasound examination of the thyroid  gland and adjacent soft tissues was performed. COMPARISON:  04/11/2024 FINDINGS: Parenchymal Echotexture: Normal Isthmus: 1.2 cm Right lobe: 5.3 x 2.4 x 2.0 cm Left lobe: 5.0 x 2.3 x 2.1 cm _________________________________________________________ Estimated total number of nodules >/= 1 cm: 0 Number of spongiform nodules >/=  2 cm not described below (TR1): 0 Number of mixed cystic and solid nodules >/= 1.5 cm not described below (TR2): 0 _________________________________________________________ Stable 0.6 cm nodule in the superior left lobe that does not meet criteria for biopsy or follow-up. Stable 0.6 cm nodule in the isthmus that does not meet criteria for biopsy or follow-up. No enlarged or abnormal appearing lymph nodes are identified. No abnormal parathyroid  nodules identified. IMPRESSION: Stable 0.6 cm thyroid  nodules in the left lobe and isthmus that do not meet criteria for biopsy or follow-up. No abnormal parathyroid  nodules identified. Electronically Signed   By: Beth [name] M.D.   On: 04/21/2024 15:58    Medications: Scheduled:  acetaminophen   1,000 mg Oral Q6H   enoxaparin  (LOVENOX ) injection  40 mg Subcutaneous Q12H   ibuprofen   600 mg Oral  Q6H   labetalol   200 mg Oral Q8H   magnesium  oxide  400 mg Oral QID   pneumococcal 20-valent conjugate vaccine  0.5 mL Intramuscular  Tomorrow-1000   potassium chloride   40 mEq Oral TID   prenatal multivitamin  1 tablet Oral Q1200   senna-docusate  2 tablet Oral Daily   simethicone   80 mg Oral TID PC   Continuous:  sodium chloride  Stopped (04/16/24 1130)   magnesium  sulfate bolus IVPB      Assessment/Plan: Primary hyperparathyroidism with hypercalcemia and hypercalcuria. Hypomagnesemia. History of kidney stones. History of hypokalemia.  We again discussed how any test can have a misleading result (including imaging, lab tests, etc.).   An intact PTH level drawn this morning remains in progress (no result yet).  However, the two previous intact PTH levels were above 30 during hypercalcemia.  This suggests primary hyperparathyroidism rather than PTH-independent causes of hypercalcemia.   She notes that she favors proceeding with parathyroid  surgery now, as offered by Dr. Krystal [name].  No clear need for any medicine for hypercalcemia today.     LOS: 30 days   Beth [name] 04/23/2024, 9:18 AM

## 2024-04-24 DIAGNOSIS — E210 Primary hyperparathyroidism: Secondary | ICD-10-CM | POA: Diagnosis present

## 2024-04-24 LAB — COMPREHENSIVE METABOLIC PANEL WITH GFR
ALT: 12 U/L (ref 0–44)
AST: 15 U/L (ref 15–41)
Albumin: 2.3 g/dL — ABNORMAL LOW (ref 3.5–5.0)
Alkaline Phosphatase: 55 U/L (ref 38–126)
Anion gap: 8 (ref 5–15)
BUN: 23 mg/dL — ABNORMAL HIGH (ref 6–20)
CO2: 21 mmol/L — ABNORMAL LOW (ref 22–32)
Calcium: 10.7 mg/dL — ABNORMAL HIGH (ref 8.9–10.3)
Chloride: 108 mmol/L (ref 98–111)
Creatinine, Ser: 0.93 mg/dL (ref 0.44–1.00)
GFR, Estimated: >60 mL/min (ref >60)
Glucose, Bld: 93 mg/dL (ref 70–99)
Potassium: 4.4 mmol/L (ref 3.5–5.1)
Sodium: 137 mmol/L (ref 135–145)
Total Bilirubin: 0.8 mg/dL (ref 0.0–1.2)
Total Protein: 4.9 g/dL — ABNORMAL LOW (ref 6.5–8.1)

## 2024-04-24 LAB — MAGNESIUM: Magnesium: 1.5 mg/dL — ABNORMAL LOW (ref 1.7–2.4)

## 2024-04-24 LAB — PARATHYROID HORMONE, INTACT (NO CA): PTH: 45 pg/mL (ref 15–65)

## 2024-04-24 MED ORDER — LABETALOL HCL 200 MG PO TABS
300.0000 mg | ORAL_TABLET | Freq: Three times a day (TID) | ORAL | Status: DC
  Administered 2024-04-24 – 2024-04-25 (×3): 300 mg via ORAL
  Filled 2024-04-24 (×3): qty 1

## 2024-04-24 MED ORDER — CHLORHEXIDINE GLUCONATE CLOTH 2 % EX PADS
6.0000 | MEDICATED_PAD | Freq: Once | CUTANEOUS | Status: AC
  Administered 2024-04-25: 6 via TOPICAL

## 2024-04-24 MED ORDER — CHLORHEXIDINE GLUCONATE CLOTH 2 % EX PADS
6.0000 | MEDICATED_PAD | Freq: Once | CUTANEOUS | Status: AC
Start: 2024-04-24 — End: 2024-04-25
  Administered 2024-04-25: 6 via TOPICAL

## 2024-04-24 MED ORDER — CEFAZOLIN SODIUM-DEXTROSE 3-4 GM/150ML-% IV SOLN
3.0000 g | INTRAVENOUS | Status: DC
  Filled 2024-04-24: qty 150

## 2024-04-24 MED ORDER — MAGNESIUM SULFATE 2 GM/50ML IV SOLN
2.0000 g | Freq: Once | INTRAVENOUS | Status: AC
  Administered 2024-04-24: 2 g via INTRAVENOUS
  Filled 2024-04-24: qty 50

## 2024-04-24 NOTE — Progress Notes (Signed)
 PROGRESS NOTE    Beth [name]  FMW:[id number] DOB: [birth year] DOA: 03/24/2024 PCP: Patient, No Pcp Per   Brief Narrative:  This 38 yrs old female G4P3 at [redacted] weeks GA with PMH significant for anxiety,  previous history of hypothyroidism, family history of renal potassium wasting, Patient was admitted on 5/29 with preeclampsia by OB.  We were consulted on 6/01 for hypokalemia.  She was treated with magnesium  and potassium supplements.  Her potassium has now remained in a good range.  Blood pressure has been better controlled.  Plan was to proceed with delivery at 34 weeks. We signed off. Patient has developed hypercalcemia.  We were consulted again on 6/14  to assist with management. Calcium  level has been ranging around 10.5----has been slowly increasing since 6/09 ---to 11----subsequently 13.6--- Calcium  correction by albumin 14.2 Patient s/p C section 6/25.POD # 4. Endocrinology and General Surgery following.   Assessment & Plan:   Active Problems:   Hyperparathyroidism, primary (HCC)  Hypercalcemia: Primary hyperparathyroidism: PTH: 46, PTH like peptide pending, Vitamin D : 61, TSH. 0.6 Dr. Faythe Endocrinologist on board, Patient appears to have primary hyperparathyroidism. 24-hour urine calcium  excludes familial and acquired hypocalciuric hypercalcemia. Continue IV Fluids General Surgery consulted, plan for parathyroid  surgery postdelivery. Continue to monitor calcium  levels. Dr. Faythe had detailed discussion with the patient regarding treatment for hypercalcemia. Patient would continue to drink plenty of water  and weightbearing exercises. General surgery scheduled patient for neck exploration and parathyroidectomy on Monday pending elevated Calcium  levels. If serum calcium  continue to improve,  She might be discharged with outpatient follow-up.   Preeclampsia: Status post repeat Low transverse C-section without extension. Treatment as per OB/GYN.   Left Ventricle Hypertrophy on EKG;  ECHO Ef 55% to 60 % There is mild concentric left ventricular hypertrophy.    Hypomagnesemia: Replete IV as needed.   Estimated body mass index is 49.65 kg/m as calculated from the following:   Height as of this encounter: 5' 11 (1.803 m).   Weight as of this encounter: 161.5 kg.   Will continue to follow-up as she remained inpatient.  Procedures: C Section.  Antimicrobials:  Anti-infectives (From admission, onward)    Start     Dose/Rate Route Frequency Ordered Stop   04/24/24 1100  ceFAZolin  (ANCEF ) IVPB 3g/150 mL premix        3 g 300 mL/hr over 30 Minutes Intravenous On call to O.R. 04/24/24 1004 04/25/24 0559   04/20/24 0825  ceFAZolin  (ANCEF ) IVPB 3g/150 mL premix        3 g 300 mL/hr over 30 Minutes Intravenous 30 min pre-op 04/20/24 0827 04/20/24 0958       Subjective: Patient was seen and examined at bedside.  Overnight events noted. Patient is status post C-section.  POD # 4.   She reports feeling much better today,  She was lying comfortably in the bed. Patient is tentatively scheduled for neck exploration and parathyroidectomy on Monday.  Objective: Vitals:   04/23/24 2005 04/24/24 0030 04/24/24 0350 04/24/24 0820  BP: (!) 154/89 131/68 (!) 143/69 (!) 150/84  Pulse: 72 77 74 63  Resp: 18  18 20   Temp: 98.2 F (36.8 C) 98.2 F (36.8 C) 98.3 F (36.8 C) 98.2 F (36.8 C)  TempSrc: Oral Oral Oral Oral  SpO2: 100% 99% 99% 100%  Weight:      Height:       No intake or output data in the 24 hours ending 04/24/24 1102  Filed Weights   03/24/24 1417  Weight: (!) 161.5 kg    Examination:  General exam: Appears calm and comfortable, not in any acute distress. Respiratory system: CTA Bilaterally. Respiratory effort normal. RR 15 Cardiovascular system: S1 & S2 heard, RRR. No JVD, murmurs, rubs, gallops or clicks.  Gastrointestinal system: Abdomen is nondistended, soft and nontender.  Normal bowel sounds heard. Central nervous system: Alert and oriented  x 3. No focal neurological deficits. Extremities: Edema+, No cyanosis , no clubbing. Skin: No rashes, lesions or ulcers Psychiatry: Judgement and insight appear normal. Mood & affect appropriate.     Data Reviewed: I have personally reviewed following labs and imaging studies  CBC: Recent Labs  Lab 04/17/24 1116 04/18/24 0455 04/20/24 0831 04/21/24 0527  WBC 6.7 8.0 13.3* 12.2*  NEUTROABS  --  5.5  --   --   HGB 9.9* 10.2* 9.8* 9.2*  HCT 31.7* 32.1* 31.2* 29.5*  MCV 82.6 82.1 82.5 82.6  PLT 196 204 220 214   Basic Metabolic Panel: Recent Labs  Lab 04/20/24 0558 04/20/24 1319 04/21/24 0527 04/22/24 0538 04/23/24 0426 04/24/24 0439  NA 137  --  133* 138 136 137  K 4.2  --  4.1 4.2 4.5 4.4  CL 109  --  105 107 107 108  CO2 22  --  24 23 25  21*  GLUCOSE 124*  --  105* 90 96 93  BUN 6  --  9 18 19  23*  CREATININE 0.90 0.88 0.90 1.02* 0.88 0.93  CALCIUM  12.9*  --  11.8* 12.6* 11.5* 10.7*  MG 1.6*  --  4.7* 1.9 1.5* 1.5*  PHOS  --   --   --   --  3.4  --    GFR: Estimated Creatinine Clearance: 141.4 mL/min (by C-G formula based on SCr of 0.93 mg/dL). Liver Function Tests: Recent Labs  Lab 04/20/24 0558 04/21/24 0527 04/22/24 0538 04/23/24 0426 04/24/24 0439  AST 22 23 21 15 15   ALT 12 11 13 11 12   ALKPHOS 80 66 61 60 55  BILITOT 0.6 0.8 0.3 0.7 0.8  PROT 6.4* 5.1* 5.1* 4.9* 4.9*  ALBUMIN 2.8* 2.3* 2.2* 2.2* 2.3*   No results for input(s): LIPASE, AMYLASE in the last 168 hours. No results for input(s): AMMONIA in the last 168 hours. Coagulation Profile: No results for input(s): INR, PROTIME in the last 168 hours. Cardiac Enzymes: No results for input(s): CKTOTAL, CKMB, CKMBINDEX, TROPONINI in the last 168 hours. BNP (last 3 results) No results for input(s): PROBNP in the last 8760 hours. HbA1C: No results for input(s): HGBA1C in the last 72 hours. CBG: No results for input(s): GLUCAP in the last 168 hours. Lipid Profile: No  results for input(s): CHOL, HDL, LDLCALC, TRIG, CHOLHDL, LDLDIRECT in the last 72 hours. Thyroid  Function Tests: Recent Labs    04/23/24 0426  TSH 1.959   Anemia Panel: No results for input(s): VITAMINB12, FOLATE, FERRITIN, TIBC, IRON , RETICCTPCT in the last 72 hours. Sepsis Labs: No results for input(s): PROCALCITON, LATICACIDVEN in the last 168 hours.  No results found for this or any previous visit (from the past 240 hours).   Radiology Studies: CT PARATHYROID  4D NECK W/WO Result Date: 04/22/2024 CLINICAL DATA:  Hyperparathyroidism EXAM: CT NECK WITH AND WITHOUT CONTRAST TECHNIQUE: Multidetector CT imaging of the mid and lower neck was performed without and with intravenous contrast. RADIATION DOSE REDUCTION: This exam was performed according to the departmental dose-optimization program which includes automated exposure control, adjustment of the mA and/or kV according to patient size  and/or use of iterative reconstruction technique. CONTRAST:  75mL OMNIPAQUE  IOHEXOL  350 MG/ML SOLN COMPARISON:  Ultrasound of the thyroid  dated April 21, 2024. FINDINGS: Pharynx and larynx: The hypopharyngeal and laryngeal soft tissues are unremarkable. The visualized oral cavity and oropharynx also appear normal. Salivary glands: The submandibular glands appear normal. The parotid glands are only partially visualized. Thyroid : Normal in size and morphology. There is no discrete nodule within of or adjacent to the thyroid  to suggest the presence of a parathyroid  adenoma. Lymph nodes: None enlarged or abnormal density. Vascular: Negative. Limited intracranial: Not visualized. Visualized orbits: Not visualized. Mastoids and visualized paranasal sinuses: Not visualized. Skeleton: No acute or aggressive process. Upper chest: Negative. Other: None. IMPRESSION: 1. No evidence of parathyroid  nodule. Electronically Signed   By: Evalene Coho M.D.   On: 04/22/2024 14:27    Scheduled Meds:   acetaminophen   1,000 mg Oral Q6H    ceFAZolin  (ANCEF ) IV  3 g Intravenous On Call to OR   Chlorhexidine  Gluconate Cloth  6 each Topical Once   And   Chlorhexidine  Gluconate Cloth  6 each Topical Once   ibuprofen   600 mg Oral Q6H   labetalol   300 mg Oral Q8H   magnesium  oxide  400 mg Oral QID   potassium chloride   40 mEq Oral TID   prenatal multivitamin  1 tablet Oral Q1200   senna-docusate  2 tablet Oral Daily   simethicone   80 mg Oral TID PC   Continuous Infusions:  sodium chloride  Stopped (04/16/24 1130)   magnesium  sulfate bolus IVPB 2 g (04/24/24 1008)     LOS: 31 days    Time spent: 35 mins    Darcel Dawley, MD [facility]   If 7PM-7AM, please contact night-coverage

## 2024-04-24 NOTE — H&P (View-Only) (Signed)
 Assessment & Plan: Primary hyperparathyroidism - unsuppressed PTH level             Calcium  10.7 mg/dl this AM - continues to improve  Intact PTH level from yesterday pending this AM             USN of thyroid  essentially normal - no enlarged parathyroid  gland identified             Sestamibi canceled - patient weight is limiting factor             4D-CT scan negative for enlarged parathyroid  gland  Tentatively on OR schedule for Monday, 6/30, at 12:30PM.  Will repeat calcium  in AM 6/30 and check results of intact PTH from yesterday.  If indicative of primary HPTH, will proceed with neck exploration as discussed.  If labs continue to normalize, may hold off on surgery and continue to follow with possible further out-patient imaging.        [name], MD [facility] practice Office: [phone]        Chief Complaint: Hypercalcemia  Subjective: Patient in bed, comfortable, asymptomatic.  Objective: Vital signs in last 24 hours: Temp:  [97.9 F (36.6 C)-98.3 F (36.8 C)] 98.3 F (36.8 C) (06/29 0350) Pulse Rate:  [56-77] 74 (06/29 0350) Resp:  [16-20] 18 (06/29 0350) BP: (118-154)/(58-93) 143/69 (06/29 0350) SpO2:  [99 %-100 %] 99 % (06/29 0350) Last BM Date : 04/22/24  Intake/Output from previous day: No intake/output data recorded. Intake/Output this shift: No intake/output data recorded.  Physical Exam: HEENT - sclerae clear, mucous membranes moist Neck - symmetric  Lab Results:  No results for input(s): WBC, HGB, HCT, PLT in the last 72 hours. BMET Recent Labs    04/23/24 0426 04/24/24 0439  NA 136 137  K 4.5 4.4  CL 107 108  CO2 25 21*  GLUCOSE 96 93  BUN 19 23*  CREATININE 0.88 0.93  CALCIUM  11.5* 10.7*   PT/INR No results for input(s): LABPROT, INR in the last 72 hours. Comprehensive Metabolic Panel:    Component Value Date/Time   NA 137 04/24/2024 0439   NA 136 04/23/2024 0426   K 4.4 04/24/2024 0439    K 4.5 04/23/2024 0426   CL 108 04/24/2024 0439   CL 107 04/23/2024 0426   CO2 21 (L) 04/24/2024 0439   CO2 25 04/23/2024 0426   BUN 23 (H) 04/24/2024 0439   BUN 19 04/23/2024 0426   CREATININE 0.93 04/24/2024 0439   CREATININE 0.88 04/23/2024 0426   GLUCOSE 93 04/24/2024 0439   GLUCOSE 96 04/23/2024 0426   CALCIUM  10.7 (H) 04/24/2024 0439   CALCIUM  11.5 (H) 04/23/2024 0426   CALCIUM  10.5 (H) 04/11/2024 0853   AST 15 04/24/2024 0439   AST 15 04/23/2024 0426   ALT 12 04/24/2024 0439   ALT 11 04/23/2024 0426   ALKPHOS 55 04/24/2024 0439   ALKPHOS 60 04/23/2024 0426   BILITOT 0.8 04/24/2024 0439   BILITOT 0.7 04/23/2024 0426   PROT 4.9 (L) 04/24/2024 0439   PROT 4.9 (L) 04/23/2024 0426   ALBUMIN 2.3 (L) 04/24/2024 0439   ALBUMIN 2.2 (L) 04/23/2024 0426    Studies/Results: CT PARATHYROID  4D NECK W/WO Result Date: 04/22/2024 CLINICAL DATA:  Hyperparathyroidism EXAM: CT NECK WITH AND WITHOUT CONTRAST TECHNIQUE: Multidetector CT imaging of the mid and lower neck was performed without and with intravenous contrast. RADIATION DOSE REDUCTION: This exam was performed according to the departmental dose-optimization program which  includes automated exposure control, adjustment of the mA and/or kV according to patient size and/or use of iterative reconstruction technique. CONTRAST:  75mL OMNIPAQUE  IOHEXOL  350 MG/ML SOLN COMPARISON:  Ultrasound of the thyroid  dated April 21, 2024. FINDINGS: Pharynx and larynx: The hypopharyngeal and laryngeal soft tissues are unremarkable. The visualized oral cavity and oropharynx also appear normal. Salivary glands: The submandibular glands appear normal. The parotid glands are only partially visualized. Thyroid : Normal in size and morphology. There is no discrete nodule within of or adjacent to the thyroid  to suggest the presence of a parathyroid  adenoma. Lymph nodes: None enlarged or abnormal density. Vascular: Negative. Limited intracranial: Not visualized.  Visualized orbits: Not visualized. Mastoids and visualized paranasal sinuses: Not visualized. Skeleton: No acute or aggressive process. Upper chest: Negative. Other: None. IMPRESSION: 1. No evidence of parathyroid  nodule. Electronically Signed   By: Evalene Coho M.D.   On: 04/22/2024 14:27      [name] 04/24/2024  Patient ID: Beth [name], female   DOB: 31-May-1986, 38 y.o.   MRN: [id number]

## 2024-04-24 NOTE — Progress Notes (Signed)
 Assessment & Plan: Primary hyperparathyroidism - unsuppressed PTH level             Calcium  10.7 mg/dl this AM - continues to improve  Intact PTH level from yesterday pending this AM             USN of thyroid  essentially normal - no enlarged parathyroid  gland identified             Sestamibi canceled - patient weight is limiting factor             4D-CT scan negative for enlarged parathyroid  gland  Tentatively on OR schedule for Monday, 6/30, at 12:30PM.  Will repeat calcium  in AM 6/30 and check results of intact PTH from yesterday.  If indicative of primary HPTH, will proceed with neck exploration as discussed.  If labs continue to normalize, may hold off on surgery and continue to follow with possible further out-patient imaging.        [name], MD [facility] practice Office: [phone]        Chief Complaint: Hypercalcemia  Subjective: Patient in bed, comfortable, asymptomatic.  Objective: Vital signs in last 24 hours: Temp:  [97.9 F (36.6 C)-98.3 F (36.8 C)] 98.3 F (36.8 C) (06/29 0350) Pulse Rate:  [56-77] 74 (06/29 0350) Resp:  [16-20] 18 (06/29 0350) BP: (118-154)/(58-93) 143/69 (06/29 0350) SpO2:  [99 %-100 %] 99 % (06/29 0350) Last BM Date : 04/22/24  Intake/Output from previous day: No intake/output data recorded. Intake/Output this shift: No intake/output data recorded.  Physical Exam: HEENT - sclerae clear, mucous membranes moist Neck - symmetric  Lab Results:  No results for input(s): WBC, HGB, HCT, PLT in the last 72 hours. BMET Recent Labs    04/23/24 0426 04/24/24 0439  NA 136 137  K 4.5 4.4  CL 107 108  CO2 25 21*  GLUCOSE 96 93  BUN 19 23*  CREATININE 0.88 0.93  CALCIUM  11.5* 10.7*   PT/INR No results for input(s): LABPROT, INR in the last 72 hours. Comprehensive Metabolic Panel:    Component Value Date/Time   NA 137 04/24/2024 0439   NA 136 04/23/2024 0426   K 4.4 04/24/2024 0439    K 4.5 04/23/2024 0426   CL 108 04/24/2024 0439   CL 107 04/23/2024 0426   CO2 21 (L) 04/24/2024 0439   CO2 25 04/23/2024 0426   BUN 23 (H) 04/24/2024 0439   BUN 19 04/23/2024 0426   CREATININE 0.93 04/24/2024 0439   CREATININE 0.88 04/23/2024 0426   GLUCOSE 93 04/24/2024 0439   GLUCOSE 96 04/23/2024 0426   CALCIUM  10.7 (H) 04/24/2024 0439   CALCIUM  11.5 (H) 04/23/2024 0426   CALCIUM  10.5 (H) 04/11/2024 0853   AST 15 04/24/2024 0439   AST 15 04/23/2024 0426   ALT 12 04/24/2024 0439   ALT 11 04/23/2024 0426   ALKPHOS 55 04/24/2024 0439   ALKPHOS 60 04/23/2024 0426   BILITOT 0.8 04/24/2024 0439   BILITOT 0.7 04/23/2024 0426   PROT 4.9 (L) 04/24/2024 0439   PROT 4.9 (L) 04/23/2024 0426   ALBUMIN 2.3 (L) 04/24/2024 0439   ALBUMIN 2.2 (L) 04/23/2024 0426    Studies/Results: CT PARATHYROID  4D NECK W/WO Result Date: 04/22/2024 CLINICAL DATA:  Hyperparathyroidism EXAM: CT NECK WITH AND WITHOUT CONTRAST TECHNIQUE: Multidetector CT imaging of the mid and lower neck was performed without and with intravenous contrast. RADIATION DOSE REDUCTION: This exam was performed according to the departmental dose-optimization program which  includes automated exposure control, adjustment of the mA and/or kV according to patient size and/or use of iterative reconstruction technique. CONTRAST:  75mL OMNIPAQUE  IOHEXOL  350 MG/ML SOLN COMPARISON:  Ultrasound of the thyroid  dated April 21, 2024. FINDINGS: Pharynx and larynx: The hypopharyngeal and laryngeal soft tissues are unremarkable. The visualized oral cavity and oropharynx also appear normal. Salivary glands: The submandibular glands appear normal. The parotid glands are only partially visualized. Thyroid : Normal in size and morphology. There is no discrete nodule within of or adjacent to the thyroid  to suggest the presence of a parathyroid  adenoma. Lymph nodes: None enlarged or abnormal density. Vascular: Negative. Limited intracranial: Not visualized.  Visualized orbits: Not visualized. Mastoids and visualized paranasal sinuses: Not visualized. Skeleton: No acute or aggressive process. Upper chest: Negative. Other: None. IMPRESSION: 1. No evidence of parathyroid  nodule. Electronically Signed   By: Evalene Coho M.D.   On: 04/22/2024 14:27      [name] 04/24/2024  Patient ID: Beth [name], female   DOB: 31-May-1986, 38 y.o.   MRN: [id number]

## 2024-04-24 NOTE — Progress Notes (Signed)
 Subjective: Postpartum Day 4: Cesarean Delivery Patient reports feeling well this AM. Pain is controlled. Ambulating, voiding, passing flatus. Tolerating PO. Lochia minimal. Denies headache, vision changes, chest pain, SOB, RUQ pain. Baby boy is doing well in the NICU. She is pumping, no issues.  Objective: Vital signs in last 24 hours: Temp:  [98.1 F (36.7 C)-98.3 F (36.8 C)] 98.1 F (36.7 C) (06/29 1233) Pulse Rate:  [61-77] 61 (06/29 1233) Resp:  [18-20] 20 (06/29 1233) BP: (131-154)/(68-93) 146/74 (06/29 1233) SpO2:  [99 %-100 %] 99 % (06/29 1233)  Physical Exam:  General: alert, cooperative, and no distress Lochia: appropriate Uterine Fundus: firm Incision: Prevena in place with no output, abdomen is soft, non-tender DVT Evaluation: No evidence of DVT seen on physical exam.  No results for input(s): HGB, HCT in the last 72 hours.   Assessment/Plan: Beth [name] is a 38 year old G4 now P4 s/p repeat C-section at [redacted]w[redacted]d for pre-eclampsia with severe features and breech presentation. POD4 today -POD#4: doing well, continue routine post-op/postpartum care. Prevena in place -pre-eclampsia w/ SF: Currently asx. Mag completed at 10:30 POD#1. Poor tolerance of Nifedipine . Increased labetalol  to 300mg  TID due to elevated blood pressures this morning. Continue to titrate PRN -Primary hyperparathyroidism with associated electrolyte abnormalities: Internal medicine and endocrine following. General surgery consulted, tentatively planning neck exploration and parathyroidectomy Monday, pending repeat calcium  and result of PTH.  -hypercalcemia: Ca 10.7 today, management per medicine/endocrine/surgery -hypokalemia: likely linked with hyperparathyroidism per Endo. Potassium still currently in normal range on potassium 40 mEq TID. 4.4 today -hypomagnesemia: also likely linked with hyperparathyroidism per Endo. Mag today at 1.5, 1 dose of IV mag 2g ordered, continue PO  supplementation. -anemia: post-op Hgb stable at 9.2. Consider PO iron  at discharge. -AKI, resolved: Cr 0.93 this AM (from 1.02).   Dispo: Continue inpatient management as above.  Beth FORBES Chapel, MD 04/24/2024, 2:16 PM

## 2024-04-24 NOTE — Lactation Note (Signed)
 This note was copied from a baby's chart.  NICU Lactation Consultation Note  Patient Name: Boy Asli Tokarski Unijb'd Date: 04/24/2024 Age:37 days  Reason for consult: Follow-up assessment; NICU baby; Late-preterm 34-36.6wks; Maternal endocrine disorder Type of Endocrine Disorder?: Thyroid  (hyperparathyroidism)  SUBJECTIVE  LC in to visit with P4 Mom of baby [name] in the NICU.   Mom had baby already latched for 10 mins when LC entered the room.  Baby in Mom's lap and Mom holding her breast.  LC noted that baby was on the nipple only, Mom denied pain.  LC provided pillow support to elevate baby and showed Mom how to tug on his chin gently to open his mouth wider onto areola.  Baby's mouth was relaxed and easy to adjust and Mom said Whoa as she felt a deeper tug.  With baby elevated and deeper on the breast, LC felt baby was sucking with a nutritive suck pattern with regular pauses.  LC left room as baby was still feeding. Encouraged Mom to continue  her consistent pumping to support a full milk supply.  Mom plans to go back to her room to pump after breastfeeding.    OBJECTIVE Infant data: No data recorded O2 Device: Room Air  Infant feeding assessment IDFTS - Readiness: 2 IDFTS - Quality: 3   Maternal data: [id number] C-Section, Low Transverse Pumping frequency: 8 times per 24 hrs Pumped volume: 30 mL Flange Size: 21 Hands-free pumping top sizes: X-Large Dori)  WIC Program: No WIC Referral Sent?: No Pump: Hands Free, Personal (Mom[employer]zy from insurance)  ASSESSMENT Infant: Latch: Grasps breast easily, tongue down, lips flanged, rhythmical sucking. Audible Swallowing: Spontaneous and intermittent Type of Nipple: Everted at rest and after stimulation (compressible areola) Comfort (Breast/Nipple): Soft / non-tender Hold (Positioning): Assistance needed to correctly position infant at breast and maintain latch. (minimal assistance) LATCH Score: 9  Feeding Status: Scheduled  8-11-2-5 Feeding method: Breast  Maternal: Milk volume: Normal  INTERVENTIONS/PLAN Interventions: Interventions: Breast feeding basics reviewed; Assisted with latch; Skin to skin; Breast compression; Adjust position; Support pillows; Position options; Expressed milk; DEBP; Education Discharge Education: Engorgement and breast care Tools: Pump; Flanges; Hands-free pumping top Pump Education: Setup, frequency, and cleaning; Milk Storage  Plan: Consult Status: NICU follow-up NICU Follow-up type: Verify absence of engorgement; Verify onset of copious milk   [name] 04/24/2024, 12:12 PM

## 2024-04-24 NOTE — Progress Notes (Signed)
 Subjective: Beth [name] reports feeling well today, more like her usual self.  She suspects this is due to the improvement in her calcium  level.  She describes in detail how well her newborn son has progressed.  She has been pumping breast milk and anticipates breastfeeding her son today.    Objective: Vital signs in last 24 hours: Temp:  [98.2 F (36.8 C)-98.3 F (36.8 C)] 98.2 F (36.8 C) (06/29 0820) Pulse Rate:  [63-77] 63 (06/29 0820) Resp:  [16-20] 20 (06/29 0820) BP: (118-154)/(58-93) 150/84 (06/29 0820) SpO2:  [99 %-100 %] 100 % (06/29 0820) Weight change:  Last BM Date : 04/22/24  Intake/Output from previous day: No intake/output data recorded. Intake/Output this shift: No intake/output data recorded.  General appearance: alert and cooperative Resp: clear to auscultation bilaterally Cardio: regular rate and rhythm  Lab Results: No results for input(s): WBC, HGB, HCT, PLT in the last 72 hours. BMET Recent Labs    04/23/24 0426 04/24/24 0439  NA 136 137  K 4.5 4.4  CL 107 108  CO2 25 21*  GLUCOSE 96 93  BUN 19 23*  CREATININE 0.88 0.93  CALCIUM  11.5* 10.7*    Studies/Results: CT PARATHYROID  4D NECK W/WO Result Date: 04/22/2024 CLINICAL DATA:  Hyperparathyroidism EXAM: CT NECK WITH AND WITHOUT CONTRAST TECHNIQUE: Multidetector CT imaging of the mid and lower neck was performed without and with intravenous contrast. RADIATION DOSE REDUCTION: This exam was performed according to the departmental dose-optimization program which includes automated exposure control, adjustment of the mA and/or kV according to patient size and/or use of iterative reconstruction technique. CONTRAST:  75mL OMNIPAQUE  IOHEXOL  350 MG/ML SOLN COMPARISON:  Ultrasound of the thyroid  dated April 21, 2024. FINDINGS: Pharynx and larynx: The hypopharyngeal and laryngeal soft tissues are unremarkable. The visualized oral cavity and oropharynx also appear normal. Salivary glands: The  submandibular glands appear normal. The parotid glands are only partially visualized. Thyroid : Normal in size and morphology. There is no discrete nodule within of or adjacent to the thyroid  to suggest the presence of a parathyroid  adenoma. Lymph nodes: None enlarged or abnormal density. Vascular: Negative. Limited intracranial: Not visualized. Visualized orbits: Not visualized. Mastoids and visualized paranasal sinuses: Not visualized. Skeleton: No acute or aggressive process. Upper chest: Negative. Other: None. IMPRESSION: 1. No evidence of parathyroid  nodule. Electronically Signed   By: Evalene Coho M.D.   On: 04/22/2024 14:27    Medications: Scheduled:  acetaminophen   1,000 mg Oral Q6H   ibuprofen   600 mg Oral Q6H   labetalol   300 mg Oral Q8H   magnesium  oxide  400 mg Oral QID   potassium chloride   40 mEq Oral TID   prenatal multivitamin  1 tablet Oral Q1200   senna-docusate  2 tablet Oral Daily   simethicone   80 mg Oral TID PC   Continuous:  sodium chloride  Stopped (04/16/24 1130)   magnesium  sulfate bolus IVPB      Assessment/Plan: Primary hyperparathyroidism with hypercalcemia and hypercalciuria. Hypomagnesemia. History of kidney stones. History of hypokalemia.  No clear need for medicine for hypercalcemia prior to surgery.     LOS: 31 days   Beth [name] 04/24/2024, 8:37 AM

## 2024-04-25 ENCOUNTER — Encounter (HOSPITAL_COMMUNITY): Payer: Self-pay | Admitting: Anesthesiology

## 2024-04-25 ENCOUNTER — Encounter (HOSPITAL_COMMUNITY)
Admission: AD | Disposition: A | Discharge status: Home / Self Care | Payer: Self-pay | Source: Home / Self Care | Attending: Obstetrics and Gynecology

## 2024-04-25 ENCOUNTER — Encounter (HOSPITAL_COMMUNITY): Payer: Self-pay | Admitting: Obstetrics and Gynecology

## 2024-04-25 ENCOUNTER — Other Ambulatory Visit: Payer: Self-pay

## 2024-04-25 ENCOUNTER — Inpatient Hospital Stay (HOSPITAL_COMMUNITY): Payer: Self-pay | Admitting: Anesthesiology

## 2024-04-25 DIAGNOSIS — E876 Hypokalemia: Secondary | ICD-10-CM | POA: Diagnosis not present

## 2024-04-25 DIAGNOSIS — E210 Primary hyperparathyroidism: Secondary | ICD-10-CM | POA: Diagnosis not present

## 2024-04-25 HISTORY — PX: THYROID EXPLORATION: SHX5280

## 2024-04-25 LAB — COMPREHENSIVE METABOLIC PANEL WITH GFR
ALT: 11 U/L (ref 0–44)
AST: 14 U/L — ABNORMAL LOW (ref 15–41)
Albumin: 2.4 g/dL — ABNORMAL LOW (ref 3.5–5.0)
Alkaline Phosphatase: 53 U/L (ref 38–126)
Anion gap: 7 (ref 5–15)
BUN: 21 mg/dL — ABNORMAL HIGH (ref 6–20)
CO2: 23 mmol/L (ref 22–32)
Calcium: 10.5 mg/dL — ABNORMAL HIGH (ref 8.9–10.3)
Chloride: 106 mmol/L (ref 98–111)
Creatinine, Ser: 0.75 mg/dL (ref 0.44–1.00)
GFR, Estimated: >60 mL/min (ref >60)
Glucose, Bld: 94 mg/dL (ref 70–99)
Potassium: 4.3 mmol/L (ref 3.5–5.1)
Sodium: 136 mmol/L (ref 135–145)
Total Bilirubin: 1 mg/dL (ref 0.0–1.2)
Total Protein: 5.1 g/dL — ABNORMAL LOW (ref 6.5–8.1)

## 2024-04-25 LAB — MAGNESIUM: Magnesium: 1.8 mg/dL (ref 1.7–2.4)

## 2024-04-25 LAB — CALCIUM, IONIZED: Calcium, Ionized, Serum: 7 mg/dL — ABNORMAL HIGH (ref 4.5–5.6)

## 2024-04-25 SURGERY — EXPLORATION, THYROID
Anesthesia: General | Site: Neck

## 2024-04-25 MED ORDER — ACETAMINOPHEN 325 MG PO TABS: 650.0000 mg | ORAL_TABLET | Freq: Four times a day (QID) | ORAL | Status: DC | PRN

## 2024-04-25 MED ORDER — PHENYLEPHRINE HCL-NACL 20-0.9 MG/250ML-% IV SOLN
INTRAVENOUS | Status: DC | PRN
  Administered 2024-04-25: 30 ug/min via INTRAVENOUS

## 2024-04-25 MED ORDER — FENTANYL CITRATE (PF) 100 MCG/2ML IJ SOLN: 25.0000 ug | INTRAMUSCULAR | Status: DC | PRN

## 2024-04-25 MED ORDER — CHLORHEXIDINE GLUCONATE 0.12 % MT SOLN: 15.0000 mL | Freq: Once | OROMUCOSAL | Status: AC

## 2024-04-25 MED ORDER — TRAMADOL HCL 50 MG PO TABS: 50.0000 mg | ORAL_TABLET | Freq: Four times a day (QID) | ORAL | Status: DC | PRN

## 2024-04-25 MED ORDER — LABETALOL HCL 200 MG PO TABS
600.0000 mg | ORAL_TABLET | Freq: Three times a day (TID) | ORAL | Status: DC
  Administered 2024-04-25 – 2024-04-27 (×5): 600 mg via ORAL
  Filled 2024-04-25 (×5): qty 3

## 2024-04-25 MED ORDER — HYDROMORPHONE HCL 1 MG/ML IJ SOLN
1.0000 mg | INTRAMUSCULAR | Status: DC | PRN
  Administered 2024-04-25: 1 mg via INTRAVENOUS
  Filled 2024-04-25: qty 1

## 2024-04-25 MED ORDER — CHLORHEXIDINE GLUCONATE 0.12 % MT SOLN
OROMUCOSAL | Status: AC
  Administered 2024-04-25: 15 mL via OROMUCOSAL
  Filled 2024-04-25: qty 15

## 2024-04-25 MED ORDER — ONDANSETRON HCL 4 MG/2ML IJ SOLN: 4.0000 mg | Freq: Four times a day (QID) | INTRAMUSCULAR | Status: DC | PRN

## 2024-04-25 MED ORDER — MIDAZOLAM HCL 2 MG/2ML IJ SOLN
INTRAMUSCULAR | Status: DC | PRN
  Administered 2024-04-25: 2 mg via INTRAVENOUS

## 2024-04-25 MED ORDER — PHENYLEPHRINE 80 MCG/ML (10ML) SYRINGE FOR IV PUSH (FOR BLOOD PRESSURE SUPPORT)
PREFILLED_SYRINGE | INTRAVENOUS | Status: DC | PRN
  Administered 2024-04-25: 160 ug via INTRAVENOUS
  Administered 2024-04-25 (×6): 80 ug via INTRAVENOUS
  Administered 2024-04-25: 160 ug via INTRAVENOUS

## 2024-04-25 MED ORDER — ACETAMINOPHEN 650 MG RE SUPP: 650.0000 mg | Freq: Four times a day (QID) | RECTAL | Status: DC | PRN

## 2024-04-25 MED ORDER — LIDOCAINE 2% (20 MG/ML) 5 ML SYRINGE
INTRAMUSCULAR | Status: AC
  Filled 2024-04-25: qty 5

## 2024-04-25 MED ORDER — BUPIVACAINE HCL 0.25 % IJ SOLN
INTRAMUSCULAR | Status: DC | PRN
  Administered 2024-04-25: 10 mL

## 2024-04-25 MED ORDER — PROPOFOL 10 MG/ML IV BOLUS
INTRAVENOUS | Status: AC
  Filled 2024-04-25: qty 20

## 2024-04-25 MED ORDER — HEMOSTATIC AGENTS (NO CHARGE) OPTIME
TOPICAL | Status: DC | PRN
  Administered 2024-04-25: 1

## 2024-04-25 MED ORDER — PROPOFOL 10 MG/ML IV BOLUS
INTRAVENOUS | Status: DC | PRN
  Administered 2024-04-25: 200 mg via INTRAVENOUS

## 2024-04-25 MED ORDER — ROCURONIUM BROMIDE 10 MG/ML (PF) SYRINGE
PREFILLED_SYRINGE | INTRAVENOUS | Status: DC | PRN
  Administered 2024-04-25 (×3): 10 mg via INTRAVENOUS
  Administered 2024-04-25: 50 mg via INTRAVENOUS

## 2024-04-25 MED ORDER — DEXTROSE 5 % IV SOLN
INTRAVENOUS | Status: DC | PRN
  Administered 2024-04-25: 3 g via INTRAVENOUS

## 2024-04-25 MED ORDER — ORAL CARE MOUTH RINSE: 15.0000 mL | Freq: Once | OROMUCOSAL | Status: AC

## 2024-04-25 MED ORDER — SODIUM CHLORIDE 0.45 % IV SOLN: INTRAVENOUS | Status: AC

## 2024-04-25 MED ORDER — SUGAMMADEX SODIUM 200 MG/2ML IV SOLN
INTRAVENOUS | Status: DC | PRN
  Administered 2024-04-25: 300 mg via INTRAVENOUS

## 2024-04-25 MED ORDER — LACTATED RINGERS IV SOLN: INTRAVENOUS | Status: DC

## 2024-04-25 MED ORDER — BUPIVACAINE HCL (PF) 0.25 % IJ SOLN
INTRAMUSCULAR | Status: AC
  Filled 2024-04-25: qty 30

## 2024-04-25 MED ORDER — LABETALOL HCL 200 MG PO TABS: 400.0000 mg | ORAL_TABLET | Freq: Three times a day (TID) | ORAL | Status: DC

## 2024-04-25 MED ORDER — FENTANYL CITRATE (PF) 250 MCG/5ML IJ SOLN
INTRAMUSCULAR | Status: AC
  Filled 2024-04-25: qty 5

## 2024-04-25 MED ORDER — EPHEDRINE SULFATE-NACL 50-0.9 MG/10ML-% IV SOSY
PREFILLED_SYRINGE | INTRAVENOUS | Status: DC | PRN
  Administered 2024-04-25: 5 mg via INTRAVENOUS
  Administered 2024-04-25 (×2): 10 mg via INTRAVENOUS

## 2024-04-25 MED ORDER — ONDANSETRON HCL 4 MG/2ML IJ SOLN
INTRAMUSCULAR | Status: DC | PRN
  Administered 2024-04-25: 4 mg via INTRAVENOUS

## 2024-04-25 MED ORDER — DEXAMETHASONE SODIUM PHOSPHATE 10 MG/ML IJ SOLN
INTRAMUSCULAR | Status: DC | PRN
  Administered 2024-04-25: 10 mg via INTRAVENOUS

## 2024-04-25 MED ORDER — 0.9 % SODIUM CHLORIDE (POUR BTL) OPTIME
TOPICAL | Status: DC | PRN
  Administered 2024-04-25: 1000 mL

## 2024-04-25 MED ORDER — FENTANYL CITRATE (PF) 250 MCG/5ML IJ SOLN
INTRAMUSCULAR | Status: DC | PRN
  Administered 2024-04-25 (×2): 50 ug via INTRAVENOUS
  Administered 2024-04-25: 100 ug via INTRAVENOUS
  Administered 2024-04-25: 50 ug via INTRAVENOUS

## 2024-04-25 MED ORDER — CEFAZOLIN SODIUM-DEXTROSE 3-4 GM/150ML-% IV SOLN
INTRAVENOUS | Status: AC
  Filled 2024-04-25: qty 150

## 2024-04-25 MED ORDER — ONDANSETRON 4 MG PO TBDP
4.0000 mg | ORAL_TABLET | Freq: Four times a day (QID) | ORAL | Status: DC | PRN
Start: 2024-04-25 — End: 2024-04-27

## 2024-04-25 MED ORDER — ROCURONIUM BROMIDE 10 MG/ML (PF) SYRINGE
PREFILLED_SYRINGE | INTRAVENOUS | Status: AC
  Filled 2024-04-25: qty 10

## 2024-04-25 MED ORDER — SUCCINYLCHOLINE CHLORIDE 200 MG/10ML IV SOSY
PREFILLED_SYRINGE | INTRAVENOUS | Status: DC | PRN
  Administered 2024-04-25: 120 mg via INTRAVENOUS

## 2024-04-25 MED ORDER — AMISULPRIDE (ANTIEMETIC) 5 MG/2ML IV SOLN: 10.0000 mg | Freq: Once | INTRAVENOUS | Status: DC | PRN

## 2024-04-25 MED ORDER — LIDOCAINE 2% (20 MG/ML) 5 ML SYRINGE
INTRAMUSCULAR | Status: DC | PRN
  Administered 2024-04-25: 60 mg via INTRAVENOUS

## 2024-04-25 MED ORDER — MIDAZOLAM HCL 2 MG/2ML IJ SOLN
INTRAMUSCULAR | Status: AC
  Filled 2024-04-25: qty 2

## 2024-04-25 SURGICAL SUPPLY — 45 items
APPLICATOR CHLORAPREP 10.5 ORG (MISCELLANEOUS) ×1 IMPLANT
ATTRACTOMAT 16X20 MAGNETIC DRP (DRAPES) ×1 IMPLANT
BAG COUNTER SPONGE SURGICOUNT (BAG) ×1 IMPLANT
BLADE SURG 10 STRL SS (BLADE) ×1 IMPLANT
BLADE SURG 15 STRL LF DISP TIS (BLADE) ×1 IMPLANT
CANISTER SUCTION 3000ML PPV (SUCTIONS) ×1 IMPLANT
CLIP TI MEDIUM 24 (CLIP) ×1 IMPLANT
CLIP TI WIDE RED SMALL 24 (CLIP) ×1 IMPLANT
CNTNR URN SCR LID CUP LEK RST (MISCELLANEOUS) IMPLANT
COVER SURGICAL LIGHT HANDLE (MISCELLANEOUS) ×1 IMPLANT
DERMABOND ADVANCED .7 DNX12 (GAUZE/BANDAGES/DRESSINGS) IMPLANT
DRAPE LAPAROTOMY 100X72 PEDS (DRAPES) ×1 IMPLANT
DRAPE UTILITY XL STRL (DRAPES) ×1 IMPLANT
DRSG TELFA 3X8 NADH STRL (GAUZE/BANDAGES/DRESSINGS) IMPLANT
ELECT CAUTERY BLADE 6.4 (BLADE) ×1 IMPLANT
ELECTRODE REM PT RTRN 9FT ADLT (ELECTROSURGICAL) ×1 IMPLANT
GAUZE 4X4 16PLY ~~LOC~~+RFID DBL (SPONGE) ×1 IMPLANT
GAUZE SPONGE 4X4 12PLY STRL (GAUZE/BANDAGES/DRESSINGS) ×1 IMPLANT
GLOVE SURG ORTHO 8.0 STRL STRW (GLOVE) ×1 IMPLANT
GOWN STRL REUS W/ TWL LRG LVL3 (GOWN DISPOSABLE) ×1 IMPLANT
GOWN STRL REUS W/ TWL XL LVL3 (GOWN DISPOSABLE) ×1 IMPLANT
HEMOSTAT ARISTA ABSORB 3G PWDR (HEMOSTASIS) IMPLANT
HEMOSTAT SURGICEL 2X4 FIBR (HEMOSTASIS) ×1 IMPLANT
KIT BASIN OR (CUSTOM PROCEDURE TRAY) ×1 IMPLANT
KIT TURNOVER KIT B (KITS) ×1 IMPLANT
LIGHT WAVEGUIDE WIDE FLAT (MISCELLANEOUS) IMPLANT
NDL HYPO 25GX1X1/2 BEV (NEEDLE) IMPLANT
NEEDLE HYPO 25GX1X1/2 BEV (NEEDLE) ×1 IMPLANT
NS IRRIG 1000ML POUR BTL (IV SOLUTION) ×1 IMPLANT
PACK SRG BSC III STRL LF ECLPS (CUSTOM PROCEDURE TRAY) ×1 IMPLANT
PAD ARMBOARD POSITIONER FOAM (MISCELLANEOUS) ×1 IMPLANT
PENCIL BUTTON HOLSTER BLD 10FT (ELECTRODE) ×1 IMPLANT
POSITIONER HEAD DONUT 9IN (MISCELLANEOUS) ×1 IMPLANT
SHEARS HARMONIC 9CM CVD (BLADE) ×1 IMPLANT
SPECIMEN JAR MEDIUM (MISCELLANEOUS) ×1 IMPLANT
SPONGE INTESTINAL PEANUT (DISPOSABLE) ×1 IMPLANT
STRIP CLOSURE SKIN 1/2X4 (GAUZE/BANDAGES/DRESSINGS) ×1 IMPLANT
SUT MNCRL AB 4-0 PS2 18 (SUTURE) ×1 IMPLANT
SUT SILK 2-0 18XBRD TIE 12 (SUTURE) ×1 IMPLANT
SUT VIC AB 3-0 SH 18 (SUTURE) ×2 IMPLANT
SYR 10ML LL (SYRINGE) IMPLANT
SYR BULB EAR ULCER 3OZ GRN STR (SYRINGE) ×1 IMPLANT
TOWEL GREEN STERILE (TOWEL DISPOSABLE) ×1 IMPLANT
TOWEL GREEN STERILE FF (TOWEL DISPOSABLE) ×1 IMPLANT
TUBE CONNECTING 12X1/4 (SUCTIONS) ×1 IMPLANT

## 2024-04-25 NOTE — Interval H&P Note (Signed)
 History and Physical Interval Note:  04/25/2024 12:20 PM  Beth [name]  has presented today for surgery, with the diagnosis of Para Thyroid .  The various methods of treatment have been discussed with the patient and family. After consideration of risks, benefits and other options for treatment, the patient has consented to   Neck exploration with parathyroidectomy   The patient's history has been reviewed, patient examined, no change in status, stable for surgery.  I have reviewed the patient's chart and labs.  Questions were answered to the patient's satisfaction.    [name], MD [facility] practice Office: [phone]   [name]

## 2024-04-25 NOTE — Transfer of Care (Signed)
 Immediate Anesthesia Transfer of Care Note  Patient: [name]  Procedure(s) Performed: EXPLORATION, THYROID , WITH BIOPSIES (Neck)  Patient Location: PACU  Anesthesia Type:General  Level of Consciousness: drowsy  Airway & Oxygen Therapy: Patient Spontanous Breathing and Patient connected to nasal cannula oxygen  Post-op Assessment: Report given to RN and Post -op Vital signs reviewed and stable  Post vital signs: Reviewed and stable  Last Vitals:  Vitals Value Taken Time  BP 135/78 04/25/24 14:40  Temp 98   Pulse 78 04/25/24 14:41  Resp 25 04/25/24 14:41  SpO2 94 % 04/25/24 14:41  Vitals shown include unfiled device data.  Last Pain:  Vitals:   04/25/24 1153  TempSrc:   PainSc: 0-No pain      Patients Stated Pain Goal: 4 (04/22/24 9178)  Complications: No notable events documented.

## 2024-04-25 NOTE — Progress Notes (Signed)
 [facility]                                                                              Beth [name], is a 38 y.o. female, DOB - 08/11/1986, FMW:[id number] Admit date - 03/24/2024    Outpatient Primary MD for the patient is Patient, No Pcp Per  LOS - 32  days  No chief complaint on file.      Brief summary   38 yrs old female G4P3 at [redacted] weeks GA with PMH significant for anxiety,  previous history of hypothyroidism, family history of renal potassium wasting, Patient was admitted on 5/29 with preeclampsia by OB.  We were consulted on 6/01 for hypokalemia.  She was treated with magnesium  and potassium supplements.  Her potassium has now remained in a good range.  Blood pressure has been better controlled.  Plan was to proceed with delivery at 34 weeks. We signed off. Patient has developed hypercalcemia.  TRH was reconsulted again on 6/14 to assist with management. Calcium  level has been ranging around 10.5----has been slowly increasing since 6/09 ---to 11----subsequently 13.6--- Calcium  correction by albumin 14.2 Patient s/p C section 6/25, Endocrinology and General Surgery following.  Assessment & Plan    Hypercalcemia: Primary hyperparathyroidism: PTH: 46, PTH like peptide pending, Vitamin D : 61, TSH. 0.6 - Endocrinology consulted, Dr. Faythe following -24-hour urine calcium  excludes familial and acquired hypocalciuric hypercalcemia.  Per endocrinology, intact PTH after pregnancy was 45 and ionized calcium  7, again favors primary hyperparathyroidism, no need for Sensipar or calcitonin at this time. -General Surgery also consulted, plan for parathyroid  surgery.  Patient is hoping to get the parathyroid  surgery while she is inpatient.    Preeclampsia: Status post repeat Low transverse C-section without extension. Management as per OB/GYN.   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  Magnesium  1.8 today   Obesity class III Estimated body mass index is 49.65 kg/m as calculated from the following:   Height as of this encounter: 5' 11 (1.803 m).   Weight as of this encounter: 161.5 kg.  Code Status: Full code DVT Prophylaxis:  SCD's Start: 04/24/24 1005 SCD's Start: 04/20/24 1310   Level of Care: Level of care: Postpartum Family Communication: Updated patient Disposition Plan:      Remains inpatient appropriate: Pending parathyroid  surgery planning    Antimicrobials:   Anti-infectives (From admission, onward)    Start     Dose/Rate Route Frequency Ordered Stop   04/24/24 1100  ceFAZolin  (ANCEF ) IVPB 3g/150 mL premix        3 g 300 mL/hr over 30 Minutes Intravenous On call to O.R. 04/24/24 1004 04/25/24 0559   04/20/24 0825  ceFAZolin  (ANCEF ) IVPB 3g/150 mL premix        3 g 300 mL/hr over 30 Minutes Intravenous 30 min pre-op 04/20/24 0827 04/20/24 0958          Medications  [MAR Hold] acetaminophen   1,000 mg Oral Q6H   chlorhexidine   15 mL Mouth/Throat Once   Or  mouth rinse  15 mL Mouth Rinse Once   Chlorhexidine  Gluconate Cloth  6 each Topical Once   [MAR Hold] ibuprofen   600 mg Oral Q6H   [MAR Hold] labetalol   400 mg Oral Q8H   [MAR Hold] magnesium  oxide  400 mg Oral QID   [MAR Hold] potassium chloride   40 mEq Oral TID   [MAR Hold] prenatal multivitamin  1 tablet Oral Q1200   [MAR Hold] senna-docusate  2 tablet Oral Daily   [MAR Hold] simethicone   80 mg Oral TID PC      Subjective:   Beth [name] was seen and examined today.  No acute complaints, no nausea vomiting, chest pain, shortness of breath or abdominal pain.  Baby boy is in NICU.  Patient is hoping for parathyroid  surgery inpatient, sooner than later.  Objective:   Vitals:   04/24/24 2044 04/25/24 0026 04/25/24 0412 04/25/24 0931  BP: 138/67 (!) 152/73 139/71 (!) 149/84  Pulse: 72 73 60 63  Resp: 18 20 18 16   Temp: 98 F  (36.7 C) 97.8 F (36.6 C) 98.1 F (36.7 C) 98.3 F (36.8 C)  TempSrc: Oral Oral Oral Oral  SpO2: 100% 100% 99% 100%  Weight:      Height:       No intake or output data in the 24 hours ending 04/25/24 1143   Wt Readings from Last 3 Encounters:  03/24/24 (!) 161.5 kg  03/24/24 (!) 161.5 kg  10/13/23 (!) 140.6 kg     Exam General: Alert and oriented x 3, NAD Cardiovascular: S1 S2 auscultated,  RRR Respiratory: Clear to auscultation bilaterally, no wheezing Gastrointestinal: Soft, nontender, nondistended, + bowel sounds Ext: no pedal edema bilaterally Neuro: no new deficits Psych: Normal affect, pleasant    Data Reviewed:  I have personally reviewed following labs    CBC Lab Results  Component Value Date   WBC 12.2 (H) 04/21/2024   RBC 3.57 (L) 04/21/2024   HGB 9.2 (L) 04/21/2024   HCT 29.5 (L) 04/21/2024   MCV 82.6 04/21/2024   MCH 25.8 (L) 04/21/2024   PLT 214 04/21/2024   MCHC 31.2 04/21/2024   RDW 22.7 (H) 04/21/2024   LYMPHSABS 1.3 04/18/2024   MONOABS 0.9 04/18/2024   EOSABS 0.2 04/18/2024   BASOSABS 0.0 04/18/2024     Last metabolic panel Lab Results  Component Value Date   NA 136 04/25/2024   K 4.3 04/25/2024   CL 106 04/25/2024   CO2 23 04/25/2024   BUN 21 (H) 04/25/2024   CREATININE 0.75 04/25/2024   GLUCOSE 94 04/25/2024   GFRNONAA >60 04/25/2024   GFRAA >60 05/29/2017   CALCIUM  10.5 (H) 04/25/2024   PHOS 3.4 04/23/2024   PROT 5.1 (L) 04/25/2024   ALBUMIN 2.4 (L) 04/25/2024   BILITOT 1.0 04/25/2024   ALKPHOS 53 04/25/2024   AST 14 (L) 04/25/2024   ALT 11 04/25/2024   ANIONGAP 7 04/25/2024    CBG (last 3)  No results for input(s): GLUCAP in the last 72 hours.    Coagulation Profile: No results for input(s): INR, PROTIME in the last 168 hours.   Radiology Studies: I have personally reviewed the imaging studies  No results found.     Beth [name] M.D. [facility] 04/25/2024, 11:43 AM  Available via Epic  secure chat 7am-7pm After 7 pm, please refer to night coverage provider listed on amion.

## 2024-04-25 NOTE — Anesthesia Preprocedure Evaluation (Signed)
 Anesthesia Evaluation  Patient identified by MRN, date of birth, ID band Patient awake    Reviewed: Allergy & Precautions, NPO status , Patient's Chart, lab work & pertinent test results  Airway Mallampati: III  TM Distance: >3 FB Neck ROM: Full    Dental  (+) Dental Advisory Given   Pulmonary neg pulmonary ROS   breath sounds clear to auscultation       Cardiovascular hypertension, Pt. on medications  Rhythm:Regular Rate:Normal     Neuro/Psych    GI/Hepatic negative GI ROS, Neg liver ROS,,,  Endo/Other    Class 3 obesityHyperparathyroid  Renal/GU negative Renal ROS     Musculoskeletal   Abdominal   Peds  Hematology  (+) Blood dyscrasia, anemia   Anesthesia Other Findings   Reproductive/Obstetrics                             Anesthesia Physical Anesthesia Plan  ASA: 3  Anesthesia Plan: General   Post-op Pain Management: Tylenol  PO (pre-op)*   Induction: Intravenous and Rapid sequence  PONV Risk Score and Plan: 3 and Dexamethasone , Ondansetron , Midazolam  and Treatment may vary due to age or medical condition  Airway Management Planned: Oral ETT  Additional Equipment:   Intra-op Plan:   Post-operative Plan: Extubation in OR  Informed Consent: I have reviewed the patients History and Physical, chart, labs and discussed the procedure including the risks, benefits and alternatives for the proposed anesthesia with the patient or authorized representative who has indicated his/her understanding and acceptance.     Dental advisory given  Plan Discussed with: CRNA  Anesthesia Plan Comments:        Anesthesia Quick Evaluation

## 2024-04-25 NOTE — Progress Notes (Signed)
 Subjective: Postpartum Day 5: Cesarean Delivery Patient reports feeling well this AM.  Incisional pain is much better today. Ambulating, voiding, passing flatus. Tolerating PO. Lochia minimal. Denies headache, vision changes, chest pain, SOB, RUQ pain. Baby boy is doing well in the NICU. She is pumping, no issues.  Awaiting decision re: possible surgery today  Objective: Vital signs in last 24 hours: Temp:  [97.8 F (36.6 C)-98.2 F (36.8 C)] 98.1 F (36.7 C) (06/30 0412) Pulse Rate:  [60-73] 60 (06/30 0412) Resp:  [18-20] 18 (06/30 0412) BP: (136-152)/(61-74) 139/71 (06/30 0412) SpO2:  [99 %-100 %] 99 % (06/30 0412)  Physical Exam:  General: alert, cooperative, and no distress Lochia: appropriate Uterine Fundus: firm Incision: Prevena in place with no output, abdomen is soft, non-tender DVT Evaluation: No evidence of DVT seen on physical exam.  No results for input(s): HGB, HCT in the last 72 hours.   Assessment/Plan: Beth [name] is a 38 year old [id number] POD#5  s/p repeat C-section at [redacted]w[redacted]d for pre-eclampsia with severe features and breech presentation.  -POD#4: doing well, continue routine post-op/postpartum care. Prevena in place -pre-eclampsia w/ SF: Currently asx. Mag completed at 10:30 POD#1. Poor tolerance of Nifedipine . Has required increasing doses of labetalol  over the weekend but now doing well on 400mg  TID.  If surgery is deferred today, would likely plan discharge home this afternoon with short interval f/u in office -Primary hyperparathyroidism with associated electrolyte abnormalities: Internal medicine and endocrine following. General surgery consulted, tentatively planning neck exploration and parathyroidectomy today, pending repeat calcium  and result of PTH.  -hypercalcemia: Ca 10.5 today, management per medicine/endocrine/surgery -hypokalemia: likely linked with hyperparathyroidism per Endo. Potassium still currently in normal range on potassium 40 mEq TID,  4.3 today -hypomagnesemia: also likely linked with hyperparathyroidism per Endo. Mag today at 1.8,  continue PO supplementation. -anemia: post-op Hgb stable at 9.2. Consider PO iron  at discharge. -AKI, resolved: Cr 0.93 this AM (from 1.02).   Dispo: Continue inpatient management as above pending decision re: neck exploration.  If surgery is deferred, likely discharge home this afternoon  [facility] [name], MD 04/25/2024, 9:28 AM

## 2024-04-25 NOTE — Op Note (Signed)
 Operative Note  Pre-operative Diagnosis: Primary hyperparathyroidism  Post-operative Diagnosis: Same  Surgeon:  [name], MD  Assistant: None   Procedure: Neck exploration, right inferior parathyroidectomy, right superior parathyroidectomy with autotransplantation to right sternocleidomastoid muscle  Anesthesia: General  Estimated Blood Loss: 15 cc  Drains: None         Specimen: Parathyroid  tissue to pathology  Indications: Patient is a 38 year old female who developed signs and symptoms of primary hyperparathyroidism during the third trimester of her pregnancy.  Patient was admitted with preeclampsia.  She was seen in consultation.  Laboratory studies showed an elevated calcium  level and an unsuppressed intact PTH level.  Patient underwent ultrasound examination and 4D CT scan of the neck without successful localization of the adenoma.  Patient now comes to surgery for neck exploration and parathyroidectomy.  Procedure:  The patient was seen in the pre-op holding area. The risks, benefits, complications, treatment options, and expected outcomes were previously discussed with the patient. The patient agreed with the proposed plan and has signed the informed consent form.  The patient was brought to the operating room by the surgical team, identified as Lauree Greenhouse and the procedure verified. A time out was completed and the above information confirmed.  Following administration of general anesthesia, the patient is positioned and then prepped and draped on the operating room table.  After ascertaining that an adequate level of anesthesia been achieved, a Kocher incision is made with a #15 blade.  Dissection is carried through subcutaneous tissues and platysma.  Hemostasis is achieved with the electrocautery.  Skin flaps are elevated cephalad and caudad.  A self-retaining retractor was placed for exposure.  Strap muscles are incised in the midline and reflected to the left exposing the  left thyroid  lobe.  Left thyroid  lobe was mildly enlarged but without nodules or dominant mass.  It was gently mobilized.  Venous tributaries were divided between ligaclips.  Inferior thyroid  artery was identified and preserved.  Immediately cephalad to the inferior thyroid  artery is what appears to be a small normal superior parathyroid  gland on the posterior aspect of the left thyroid  lobe.  Just inferior to the inferior thyroid  artery is a rounded slightly nodular but normal-sized inferior parathyroid  gland.  Dry pack is placed in the left neck.  Next we turned our attention to the right side.  Again strap muscles are reflected laterally exposing a moderately enlarged right thyroid  lobe without nodules or mass.  Right lobe is gently mobilized.  Venous tributaries are divided between ligaclips with the harmonic scalpel.  Exploration reveals the inferior thyroid  artery with what appears to be an enlarged irregularly-shaped parathyroid  gland immediately superior to the artery.  This gland is gently dissected out using the harmonic scalpel.  The gland is completely excised.  It is mildly enlarged but grossly appears as though this is normal parathyroid  tissue.  Frozen section biopsy confirms parathyroid  tissue which is not fat depleted and appears only mildly hypercellular.  Therefore a decision is made to reimplant the remaining parathyroid  tissue into the right sternocleidomastoid muscle.  This is performed by creating a muscular pocket in the anterior aspect of the sternocleidomastoid muscle.  The fragments of parathyroid  tissue are inserted into the muscle and secured with a figure-of-eight 3-0 Vicryl suture.  Further dissection on the right side allows for complete mobilization of the right lobe.  Inferior pole was inspected and no sign of parathyroid  tissue.  Therefore the right thyro-thymic tract is dissected out and opened.  It contains an  enlarged parathyroid  gland which was extending into the upper  portion of the mediastinum on the right.  It was gently mobilized.  Vascular pedicles divided between ligaclips and the gland is excised.  It was submitted in its entirety to pathology for review.  Frozen section biopsy confirms parathyroid  tissue consistent with adenoma.  Neck is irrigated with warm saline.  Good hemostasis is achieved throughout the operative field.  Fibrillar was placed throughout the operative field.  Strap muscles are reapproximated in the midline with interrupted 3-0 Vicryl sutures.  Platysma was closed with interrupted 3-0 Vicryl sutures.  Skin is closed with a running 4-0 Monocryl subcuticular suture.  Wound was washed and dried and Dermabond is applied as dressing.  Patient is awakened from anesthesia and transferred to the recovery room in stable condition.  The patient tolerated the procedure well.   [name], MD [facility] Surgery Office: [phone]

## 2024-04-25 NOTE — Anesthesia Procedure Notes (Signed)
 Procedure Name: Intubation Date/Time: 04/25/2024 12:42 PM  Performed by: Atanacio Arland HERO, CRNAPre-anesthesia Checklist: Patient identified, Emergency Drugs available, Suction available and Patient being monitored Patient Re-evaluated:Patient Re-evaluated prior to induction Oxygen Delivery Method: Circle System Utilized Preoxygenation: Pre-oxygenation with 100% oxygen Induction Type: IV induction and Rapid sequence Laryngoscope Size: Mac and 4 Grade View: Grade II Tube type: Oral Tube size: 7.0 mm Number of attempts: 1 Airway Equipment and Method: Stylet Placement Confirmation: ETT inserted through vocal cords under direct vision, positive ETCO2 and breath sounds checked- equal and bilateral Secured at: 22 cm Tube secured with: Tape Dental Injury: Teeth and Oropharynx as per pre-operative assessment

## 2024-04-25 NOTE — Progress Notes (Signed)
 Subjective: Beth [name] feels like herself again.  No symptoms of hypercalcemia now.  Objective: Vital signs in last 24 hours: Temp:  [97.8 F (36.6 C)-98.2 F (36.8 C)] 98.1 F (36.7 C) (06/30 0412) Pulse Rate:  [60-73] 60 (06/30 0412) Resp:  [18-20] 18 (06/30 0412) BP: (136-152)/(61-84) 139/71 (06/30 0412) SpO2:  [99 %-100 %] 99 % (06/30 0412) Weight change:  Last BM Date : 04/22/24  Intake/Output from previous day: No intake/output data recorded. Intake/Output this shift: No intake/output data recorded.  General appearance: alert and cooperative Resp: clear to auscultation bilaterally Cardio: regular rate and rhythm  Lab Results: No results for input(s): WBC, HGB, HCT, PLT in the last 72 hours. BMET Recent Labs    04/24/24 0439 04/25/24 0455  NA 137 136  K 4.4 4.3  CL 108 106  CO2 21* 23  GLUCOSE 93 94  BUN 23* 21*  CREATININE 0.93 0.75  CALCIUM  10.7* 10.5*    Studies/Results: No results found.  Medications: Scheduled:  acetaminophen   1,000 mg Oral Q6H   Chlorhexidine  Gluconate Cloth  6 each Topical Once   ibuprofen   600 mg Oral Q6H   labetalol   400 mg Oral Q8H   magnesium  oxide  400 mg Oral QID   potassium chloride   40 mEq Oral TID   prenatal multivitamin  1 tablet Oral Q1200   senna-docusate  2 tablet Oral Daily   simethicone   80 mg Oral TID PC   Continuous:  sodium chloride  Stopped (04/16/24 1130)    Assessment/Plan: Primary hyperparathyroidism with hypercalcemia and hypercalcuria History of kidney stones History of hypomagnesemia History of hypokalemia  Her intact PTH after pregnancy was 45 when ionized calcium  7.  This again favors primary hyperparathyroidism (rather than PTH-independent causes of hypercalcemia).  No clear need for Sensipar or Calcitonin at this time.  We had a detailed discussion today about laboratory findings, care plan, prognosis, etc.    She agreed to a clinic visit with me soon--regardless of whether  parathyroid  surgery happens today or later.   21 minutes devoted to review of records, face-to-face visit, and post-visit documentation.    LOS: 32 days   Beth [name] 04/25/2024, 8:15 AM

## 2024-04-26 ENCOUNTER — Encounter (HOSPITAL_COMMUNITY): Payer: Self-pay | Admitting: Surgery

## 2024-04-26 ENCOUNTER — Other Ambulatory Visit: Payer: Self-pay

## 2024-04-26 DIAGNOSIS — E876 Hypokalemia: Secondary | ICD-10-CM | POA: Diagnosis not present

## 2024-04-26 LAB — COMPREHENSIVE METABOLIC PANEL WITH GFR
ALT: 14 U/L (ref 0–44)
AST: 23 U/L (ref 15–41)
Albumin: 2.7 g/dL — ABNORMAL LOW (ref 3.5–5.0)
Alkaline Phosphatase: 61 U/L (ref 38–126)
Anion gap: 12 (ref 5–15)
BUN: 25 mg/dL — ABNORMAL HIGH (ref 6–20)
CO2: 21 mmol/L — ABNORMAL LOW (ref 22–32)
Calcium: 9.1 mg/dL (ref 8.9–10.3)
Chloride: 105 mmol/L (ref 98–111)
Creatinine, Ser: 1.04 mg/dL — ABNORMAL HIGH (ref 0.44–1.00)
GFR, Estimated: >60 mL/min (ref >60)
Glucose, Bld: 104 mg/dL — ABNORMAL HIGH (ref 70–99)
Potassium: 3.9 mmol/L (ref 3.5–5.1)
Sodium: 138 mmol/L (ref 135–145)
Total Bilirubin: 0.7 mg/dL (ref 0.0–1.2)
Total Protein: 5.8 g/dL — ABNORMAL LOW (ref 6.5–8.1)

## 2024-04-26 LAB — MAGNESIUM
Magnesium: 1.6 mg/dL — ABNORMAL LOW (ref 1.7–2.4)
Magnesium: 2 mg/dL (ref 1.7–2.4)

## 2024-04-26 LAB — SURGICAL PATHOLOGY

## 2024-04-26 LAB — CALCIUM: Calcium: 8.8 mg/dL — ABNORMAL LOW (ref 8.9–10.3)

## 2024-04-26 MED ORDER — CALCIUM GLUCONATE-NACL 2-0.675 GM/100ML-% IV SOLN
2.0000 g | Freq: Once | INTRAVENOUS | Status: AC
  Administered 2024-04-26: 2000 mg via INTRAVENOUS
  Filled 2024-04-26: qty 100

## 2024-04-26 MED ORDER — MAGNESIUM SULFATE 2 GM/50ML IV SOLN
2.0000 g | Freq: Once | INTRAVENOUS | Status: AC
  Administered 2024-04-26: 2 g via INTRAVENOUS
  Filled 2024-04-26: qty 50

## 2024-04-26 MED ORDER — CALCITRIOL 0.25 MCG PO CAPS
0.2500 ug | ORAL_CAPSULE | Freq: Two times a day (BID) | ORAL | Status: DC
  Administered 2024-04-26: 0.25 ug via ORAL
  Filled 2024-04-26 (×2): qty 1

## 2024-04-26 MED ORDER — CALCIUM GLUCONATE-NACL 2-0.675 GM/100ML-% IV SOLN: 2.0000 g | Freq: Once | INTRAVENOUS | Status: DC

## 2024-04-26 NOTE — Progress Notes (Signed)
 Post Partum Day 6 Subjective: Initiating was feeling well after surgery but has now developed muscle aches (5/10)  and lip tingling.   Pain is minimal. Ambulating, voiding, passing flatus. Pumping for baby in NICU  Objective: Blood pressure 127/69, pulse 84, temperature 98.3 F (36.8 C), temperature source Oral, resp. rate 18, height 5' 11 (1.803 m), weight (!) 159.2 kg, SpO2 100%, unknown if currently breastfeeding.  Physical Exam:  General: alert, cooperative, no distress, and morbidly obese. Surgical incision present over lower neck. Dermabond over incision, well-approximated edges Lochia: appropriate Uterine Fundus: firm Incision: Prevena in place, canister and tubing dry DVT Evaluation: No evidence of DVT seen on physical exam. No significant calf/ankle edema.  No results for input(s): HGB, HCT in the last 72 hours.  Assessment/Plan: Beth [name] is a 38 year old [id number] POD#6  s/p repeat C-section at [redacted]w[redacted]d for pre-eclampsia with severe features and breech presentation.  -POD#6: routine postpartum care, prevena in place -pre-eclampsia w/ SF: Currently asx. Mag completed at 10:30 POD#1. Poor tolerance of Nifedipine . Labetalol  600 TID, much improved after titration yesterday. She has a BP cuff at home and will have short interval follow-up  -Primary hyperparathyroidism with associated electrolyte abnormalities:  S/p neck exploration and parathyroidectomy 6/30. Cleared for DC from surgical perspective. Will have outpatient f/u with General Surgery in 3 weeks and Endocrinology within 1 week -hypercalcemia: Ca has improved post-op, 8.8 today -hypokalemia: likely linked with hyperparathyroidism per Endo. On potassium 40 mEq TID -hypomagnesemia: also likely linked with hyperparathyroidism per Endo. Mag today at 1.6, continue PO supplementation. -Anemia: post-op Hgb stable at 9.2. PO iron  at discharge. -AKI, resolved   Dispo: Given new change in symptoms, will need to repeat labs.  DC today vs tomorrow pending result   LOS: 33 days   Beth DELENA Sharps, DO 04/26/2024, 1:42 PM

## 2024-04-26 NOTE — Progress Notes (Signed)
    Assessment & Plan: POD#1 - status post neck exploration and parathyroidectomy  - Calcium  normal this AM at 8.8 mg/dl  - Voice normal  - Pain controlled  - OK for discharge from surgical standpoint  Will arrange follow up at [facility] office in 3 weeks with lab work.        Beth Spinner, MD [facility] practice Office: [phone]        Chief Complaint: HPTH  Subjective: Patient doing well, seated in bed, no complaints.  Objective: Vital signs in last 24 hours: Temp:  [98 F (36.7 C)-98.9 F (37.2 C)] 98.2 F (36.8 C) (07/01 0830) Pulse Rate:  [52-79] 67 (07/01 0830) Resp:  [16-22] 18 (07/01 0830) BP: (124-151)/(63-101) 138/78 (07/01 0830) SpO2:  [93 %-100 %] 99 % (07/01 0615) Weight:  [159.2 kg] 159.2 kg (06/30 1146) Last BM Date : 04/22/24  Intake/Output from previous day: 06/30 0701 - 07/01 0700 In: 1852.3 [I.V.:1702.3; IV Piggyback:150] Out: 1010 [Urine:1000; Blood:10] Intake/Output this shift: No intake/output data recorded.  Physical Exam: HEENT - sclerae clear, mucous membranes moist Neck - mild STS, voice normal, Dermabond in place  Lab Results:  No results for input(s): WBC, HGB, HCT, PLT in the last 72 hours. BMET Recent Labs    04/24/24 0439 04/25/24 0455 04/26/24 0611  NA 137 136  --   K 4.4 4.3  --   CL 108 106  --   CO2 21* 23  --   GLUCOSE 93 94  --   BUN 23* 21*  --   CREATININE 0.93 0.75  --   CALCIUM  10.7* 10.5* 8.8*   PT/INR No results for input(s): LABPROT, INR in the last 72 hours. Comprehensive Metabolic Panel:    Component Value Date/Time   NA 136 04/25/2024 0455   NA 137 04/24/2024 0439   K 4.3 04/25/2024 0455   K 4.4 04/24/2024 0439   CL 106 04/25/2024 0455   CL 108 04/24/2024 0439   CO2 23 04/25/2024 0455   CO2 21 (L) 04/24/2024 0439   BUN 21 (H) 04/25/2024 0455   BUN 23 (H) 04/24/2024 0439   CREATININE 0.75 04/25/2024 0455   CREATININE 0.93 04/24/2024 0439   GLUCOSE 94  04/25/2024 0455   GLUCOSE 93 04/24/2024 0439   CALCIUM  8.8 (L) 04/26/2024 0611   CALCIUM  10.5 (H) 04/25/2024 0455   CALCIUM  10.5 (H) 04/11/2024 0853   AST 14 (L) 04/25/2024 0455   AST 15 04/24/2024 0439   ALT 11 04/25/2024 0455   ALT 12 04/24/2024 0439   ALKPHOS 53 04/25/2024 0455   ALKPHOS 55 04/24/2024 0439   BILITOT 1.0 04/25/2024 0455   BILITOT 0.8 04/24/2024 0439   PROT 5.1 (L) 04/25/2024 0455   PROT 4.9 (L) 04/24/2024 0439   ALBUMIN 2.4 (L) 04/25/2024 0455   ALBUMIN 2.3 (L) 04/24/2024 0439    Studies/Results: No results found.    Beth [name] 04/26/2024  Patient ID: Beth [name], female   DOB: 1986/10/15, 38 y.o.   MRN: [id number]

## 2024-04-26 NOTE — Anesthesia Postprocedure Evaluation (Signed)
 Anesthesia Post Note  Patient: [name]  Procedure(s) Performed: EXPLORATION, THYROID , WITH BIOPSIES (Neck)     Patient location during evaluation: PACU Anesthesia Type: General Level of consciousness: awake Pain management: pain level controlled Vital Signs Assessment: post-procedure vital signs reviewed and stable Respiratory status: spontaneous breathing, nonlabored ventilation and respiratory function stable Cardiovascular status: blood pressure returned to baseline and stable Postop Assessment: no apparent nausea or vomiting Anesthetic complications: no   No notable events documented.  Last Vitals:  Vitals:   04/26/24 0615 04/26/24 0830  BP: 138/75 138/78  Pulse: 69 67  Resp: 18 18  Temp: 36.8 C 36.8 C  SpO2: 99%     Last Pain:  Vitals:   04/26/24 0916  TempSrc:   PainSc: 3                  Julie Paolini P Yousaf Sainato

## 2024-04-26 NOTE — Progress Notes (Signed)
 Patient ID: Beth [name], female   DOB: [birth year], 39 y.o.   MRN: [id number]  CMP and magnesium  repeated. Ca normal at 9.1, mag 2.0. Bump in Cr to 1.04. Patient continued to report symptoms of muscle pain and lip tingling, unchanged from earlier in day. After discussion with IM, Dr. Alvia recommended observation with repeat labs in AM

## 2024-04-26 NOTE — Lactation Note (Signed)
 This note was copied from a baby's chart.  NICU Lactation Consultation Note  Patient Name: Beth [name] Date: 04/26/2024 Age:37 days  Reason for consult: Follow-up assessment; NICU baby; Late-preterm 34-36.6wks; Maternal endocrine disorder; Other (Comment) (AMA, IVF pregnancy) Type of Endocrine Disorder?: Thyroid  (parathyroid  removed 04/25/24) GHTN  SUBJECTIVE  LC in to visit with P4 Mom of LPT baby Beth [name] in the NICU.  Baby has been going to the breast using IDF 1.    Mom has been trying to pump consistently.  She had surgery on her parathyroid  yesterday and her calcium  levels are unstable.    Mom reports that her nipples are pulling far into flange with pumping.  LC resized her with 18 mm and Mom to watch the pump strength.  Mom will use maintain mode now as she has been expressing over 20 ml several times.   Mom encouraged to continue her pumping as close to every 3 hrs as she can.  Mom knows to pump AFTER baby breastfeeds.  Unsure of maternal discharge today or tomorrow.   OBJECTIVE Infant data: No data recorded O2 Device: Room Air  Infant feeding assessment IDFTS - Readiness: 2 IDFTS - Quality: 2   Maternal data: [id number] C-Section, Low Transverse Pumping frequency: 6-8 times per 24 hrs Pumped volume: 25 mL (25-60 ml) Flange Size: 18  WIC Program: No WIC Referral Sent?: No Pump: Hands Free, Personal (Mom[employer] from insurance)  ASSESSMENT Infant:  Feeding Status: Scheduled 8-11-2-5 Feeding method: Bottle; Tube/Gavage (Bolus) Nipple Type: Dr. Jonna Fling Preemie  Maternal: Milk volume: Normal (low normal)  INTERVENTIONS/PLAN Interventions: Interventions: Breast feeding basics reviewed; Skin to skin; Breast massage; Hand express; DEBP; Coconut oil; Education Discharge Education: Engorgement and breast care Tools: Pump; Flanges; Hands-free pumping top; Coconut oil Pump Education: Setup, frequency, and cleaning; Milk Storage  Plan: Consult Status:  NICU follow-up NICU Follow-up type: Maternal D/C visit; Verify onset of copious milk; Verify absence of engorgement   [name] 04/26/2024, 3:58 PM

## 2024-04-26 NOTE — Progress Notes (Signed)
 Subjective: Beth [name] feels better today.  She had some pain from the parathyroid  surgery, but that has greatly improved.  No new concerns today.  She anticipates returning home soon, likely today.  Objective: Vital signs in last 24 hours: Temp:  [98 F (36.7 C)-98.9 F (37.2 C)] 98.3 F (36.8 C) (07/01 0615) Pulse Rate:  [52-79] 69 (07/01 0615) Resp:  [16-22] 18 (07/01 0615) BP: (124-151)/(63-101) 138/75 (07/01 0615) SpO2:  [93 %-100 %] 99 % (07/01 0615) Weight:  [159.2 kg] 159.2 kg (06/30 1146) Weight change:  Last BM Date : 04/22/24  Intake/Output from previous day: 06/30 0701 - 07/01 0700 In: 1852.3 [I.V.:1702.3; IV Piggyback:150] Out: 1010 [Urine:1000; Blood:10] Intake/Output this shift: No intake/output data recorded.  General appearance: alert and cooperative Resp: clear to auscultation bilaterally Cardio: regular rate and rhythm  Lab Results: No results for input(s): WBC, HGB, HCT, PLT in the last 72 hours. BMET Recent Labs    04/24/24 0439 04/25/24 0455 04/26/24 0611  NA 137 136  --   K 4.4 4.3  --   CL 108 106  --   CO2 21* 23  --   GLUCOSE 93 94  --   BUN 23* 21*  --   CREATININE 0.93 0.75  --   CALCIUM  10.7* 10.5* 8.8*    Studies/Results: No results found.  Medications: Scheduled:  acetaminophen   1,000 mg Oral Q6H   ibuprofen   600 mg Oral Q6H   labetalol   600 mg Oral Q8H   magnesium  oxide  400 mg Oral QID   potassium chloride   40 mEq Oral TID   prenatal multivitamin  1 tablet Oral Q1200   senna-docusate  2 tablet Oral Daily   simethicone   80 mg Oral TID PC   Continuous:  sodium chloride  50 mL/hr at 04/25/24 1600   sodium chloride  Stopped (04/16/24 1130)    Assessment/Plan: Mild hypomagnesemia. History of primary hyperparathyroidism with hypercalcemia and hypercalciuria. History of kidney stones History of hypokalemia  Albumin-adjusted calcium  now 10.1 mg/dL (normal).  This is good news.  She agreed to a follow-up visit with  me at [facility]ology later this week or next week.  I plan to have our staff call her for her clinic visit with us .  I appreciate the care provided by Dr. [name], Beth [name]'s obstetricians, and [facility].    LOS: 33 days   Beth [name] 04/26/2024, 8:05 AM  22 minutes devoted to care today including pre-visit, face-to-face, and post-visit time.

## 2024-04-26 NOTE — Progress Notes (Signed)
 PROGRESS NOTE    Beth [name]  FMW:[id number] DOB: 1986/02/01 DOA: 03/24/2024 PCP: Patient, No Pcp Per    Brief Narrative: 38 yrs old female G4P3 at [redacted] weeks GA with PMH significant for anxiety,  previous history of hypothyroidism, family history of renal potassium wasting, Patient was admitted on 5/29 with preeclampsia by OB.  We were consulted on 6/01 for hypokalemia.  She was treated with magnesium  and potassium supplements.  Her potassium has now remained in a good range.  Blood pressure has been better controlled.  Plan was to proceed with delivery at 34 weeks. We signed off. Patient has developed hypercalcemia.  TRH was reconsulted again on 6/14 to assist with management. Calcium  level has been ranging around 10.5----has been slowly increasing since 6/09 ---to 11----subsequently 13.6--- Calcium  correction by albumin 14.2 Patient s/p C section 6/25, Endocrinology and General Surgery following.  Assessment & Plan:   Active Problems:   Hyperparathyroidism, primary (HCC)   Hypercalcemia: Primary hyperparathyroidism: PTH: 46, PTH like peptide 45, Vitamin D : 61, TSH. 0.6 - Endocrinology consulted, Dr. Faythe following -24-hour urine calcium  excludes familial and acquired hypocalciuric hypercalcemia.  Per endocrinology, intact PTH after pregnancy was 45 and ionized calcium  7, again favors primary hyperparathyroidism, no need for Sensipar or calcitonin at this time. Status post neck exploration and parathyroidectomy 04/25/2024 Follow-up with general surgery in 3 weeks Calcium  down to 8.8  Preeclampsia: Status post repeat Low transverse C-section without extension. Management as per OB/GYN.   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   Weight as of this encounter: 161.5 kg.   Estimated body mass index is 48.95 kg/m as calculated from the following:   Height as of this encounter: 5' 11 (1.803 m).   Weight as of this encounter: 159.2 kg.  DVT prophylaxis: scd Code Status: full Family Communication: updated patient Disposition Plan:  Status is: Inpatient Remains inpatient appropriate because: acute illness  Procedures: parathyroidectomy Antimicrobials:none  Subjective:feels better Some incisional pain  Objective: Vitals:   04/25/24 2351 04/25/24 2353 04/26/24 0615 04/26/24 0830  BP:  124/63 138/75 138/78  Pulse:  79 69 67  Resp: 18  18 18   Temp: 98.4 F (36.9 C)  98.3 F (36.8 C) 98.2 F (36.8 C)  TempSrc: Oral  Oral Oral  SpO2:   99%   Weight:      Height:        Intake/Output Summary (Last 24 hours) at 04/26/2024 9062 Last data filed at 04/26/2024 0610 Gross per 24 hour  Intake 1852.34 ml  Output 1010 ml  Net 842.34 ml   Filed Weights   03/24/24 1417 04/25/24 1146  Weight: (!) 161.5 kg (!) 159.2 kg    Examination:  General exam: Appears IN NAD Respiratory system: Clear to auscultation. Respiratory effort normal. Cardiovascular system: reg. Gastrointestinal system: soft nt nd Central nervous system: Alert and oriented. No focal neurological deficits. Extremities: no edema  Data Reviewed: I have personally reviewed following labs and imaging studies  CBC: Recent Labs  Lab 04/20/24 0831 04/21/24 0527  WBC 13.3* 12.2*  HGB 9.8* 9.2*  HCT 31.2* 29.5*  MCV 82.5 82.6  PLT 220 214   Basic Metabolic Panel: Recent Labs  Lab 04/21/24 0527 04/22/24 0538 04/23/24 0426 04/24/24 0439 04/25/24 0455 04/26/24 0611  NA 133* 138 136 137 136  --   K 4.1 4.2 4.5 4.4 4.3  --   CL 105 107 107 108 106  --   CO2 24 23 25  21* 23  --   GLUCOSE 105* 90 96 93 94  --   BUN 9 18 19  23* 21*  --   CREATININE 0.90 1.02* 0.88 0.93 0.75  --   CALCIUM  11.8* 12.6* 11.5*  10.7* 10.5* 8.8*  MG 4.7* 1.9 1.5* 1.5* 1.8 1.6*  PHOS  --   --  3.4  --   --   --    GFR: Estimated Creatinine Clearance: 163 mL/min (by C-G formula based on SCr of 0.75 mg/dL). Liver Function Tests: Recent Labs  Lab 04/21/24 0527 04/22/24 0538 04/23/24 0426 04/24/24 0439 04/25/24 0455  AST 23 21 15 15  14*  ALT 11 13 11 12 11   ALKPHOS 66 61 60 55 53  BILITOT 0.8 0.3 0.7 0.8 1.0  PROT 5.1* 5.1* 4.9* 4.9* 5.1*  ALBUMIN 2.3* 2.2* 2.2* 2.3* 2.4*   No results for input(s): LIPASE, AMYLASE in the last 168 hours. No results for input(s): AMMONIA in the last 168 hours. Coagulation Profile: No results for input(s): INR, PROTIME in the last 168 hours. Cardiac Enzymes: No results for input(s): CKTOTAL, CKMB, CKMBINDEX, TROPONINI in the last 168 hours. BNP (last 3 results) No results for input(s): PROBNP in the last 8760 hours. HbA1C: No results for input(s): HGBA1C in the last 72 hours. CBG: No results for input(s): GLUCAP in the last 168 hours. Lipid Profile: No results for input(s): CHOL, HDL, LDLCALC, TRIG, CHOLHDL, LDLDIRECT in the last 72 hours. Thyroid  Function Tests: No results for input(s): TSH, T4TOTAL, FREET4, T3FREE, THYROIDAB in the last 72 hours. Anemia Panel: No results for input(s): VITAMINB12, FOLATE, FERRITIN, TIBC, IRON , RETICCTPCT in the last 72 hours. Sepsis Labs: No results for input(s): PROCALCITON, LATICACIDVEN in the last 168 hours.  No results found for this or any previous visit (from the past 240 hours).   Radiology Studies: No results found.   Scheduled Meds:  acetaminophen   1,000 mg Oral Q6H   ibuprofen   600 mg Oral Q6H   labetalol   600 mg Oral Q8H   magnesium  oxide  400 mg Oral QID   potassium chloride   40 mEq Oral TID   prenatal multivitamin  1 tablet Oral Q1200   senna-docusate  2 tablet Oral Daily   simethicone   80 mg Oral TID PC   Continuous Infusions:  sodium chloride  50  mL/hr at 04/25/24 1600   sodium chloride  Stopped (04/16/24 1130)     LOS: 33 days    Time spent: 38 min Beth KANDICE Hoots, MD 04/26/2024, 9:37 AM

## 2024-04-27 ENCOUNTER — Other Ambulatory Visit (HOSPITAL_COMMUNITY): Payer: Self-pay

## 2024-04-27 LAB — CBC
HCT: 29.8 % — ABNORMAL LOW (ref 36.0–46.0)
Hemoglobin: 9.2 g/dL — ABNORMAL LOW (ref 12.0–15.0)
MCH: 25.8 pg — ABNORMAL LOW (ref 26.0–34.0)
MCHC: 30.9 g/dL (ref 30.0–36.0)
MCV: 83.7 fL (ref 80.0–100.0)
Platelets: 224 10*3/uL (ref 150–400)
RBC: 3.56 MIL/uL — ABNORMAL LOW (ref 3.87–5.11)
RDW: 23.1 % — ABNORMAL HIGH (ref 11.5–15.5)
WBC: 5.4 10*3/uL (ref 4.0–10.5)
nRBC: 0 % (ref 0.0–0.2)

## 2024-04-27 LAB — COMPREHENSIVE METABOLIC PANEL WITH GFR
ALT: 13 U/L (ref 0–44)
AST: 15 U/L (ref 15–41)
Albumin: 2.5 g/dL — ABNORMAL LOW (ref 3.5–5.0)
Alkaline Phosphatase: 53 U/L (ref 38–126)
Anion gap: 8 (ref 5–15)
BUN: 25 mg/dL — ABNORMAL HIGH (ref 6–20)
CO2: 22 mmol/L (ref 22–32)
Calcium: 8.4 mg/dL — ABNORMAL LOW (ref 8.9–10.3)
Chloride: 106 mmol/L (ref 98–111)
Creatinine, Ser: 0.98 mg/dL (ref 0.44–1.00)
GFR, Estimated: >60 mL/min (ref >60)
Glucose, Bld: 94 mg/dL (ref 70–99)
Potassium: 4.3 mmol/L (ref 3.5–5.1)
Sodium: 136 mmol/L (ref 135–145)
Total Bilirubin: 0.7 mg/dL (ref 0.0–1.2)
Total Protein: 5.5 g/dL — ABNORMAL LOW (ref 6.5–8.1)

## 2024-04-27 LAB — MAGNESIUM: Magnesium: 1.9 mg/dL (ref 1.7–2.4)

## 2024-04-27 MED ORDER — MAGNESIUM OXIDE -MG SUPPLEMENT 400 (240 MG) MG PO TABS
400.0000 mg | ORAL_TABLET | Freq: Four times a day (QID) | ORAL | 0 refills | Status: AC
Start: 2024-04-27 — End: 2024-05-27
  Filled 2024-04-27: qty 120, 30d supply, fill #0

## 2024-04-27 MED ORDER — OXYCODONE HCL 5 MG PO TABS
5.0000 mg | ORAL_TABLET | Freq: Four times a day (QID) | ORAL | 0 refills | Status: AC | PRN
  Filled 2024-04-27: qty 20, 5d supply, fill #0

## 2024-04-27 MED ORDER — LABETALOL HCL 300 MG PO TABS
600.0000 mg | ORAL_TABLET | Freq: Three times a day (TID) | ORAL | 0 refills | Status: AC
Start: 2024-04-27 — End: 2024-05-27
  Filled 2024-04-27: qty 180, 30d supply, fill #0

## 2024-04-27 MED ORDER — IBUPROFEN 800 MG PO TABS
800.0000 mg | ORAL_TABLET | Freq: Three times a day (TID) | ORAL | 1 refills | Status: AC | PRN
  Filled 2024-04-27: qty 60, 20d supply, fill #0

## 2024-04-27 MED ORDER — CALCITRIOL 0.25 MCG PO CAPS
0.2500 ug | ORAL_CAPSULE | Freq: Every day | ORAL | 0 refills | Status: AC
Start: 2024-04-27 — End: 2024-05-11
  Filled 2024-04-27: qty 14, 14d supply, fill #0

## 2024-04-27 MED ORDER — POTASSIUM CHLORIDE CRYS ER 20 MEQ PO TBCR
40.0000 meq | EXTENDED_RELEASE_TABLET | Freq: Three times a day (TID) | ORAL | 0 refills | Status: AC
Start: 2024-04-27 — End: 2024-05-27
  Filled 2024-04-27: qty 120, 20d supply, fill #0

## 2024-04-27 MED ORDER — CALCIUM CARBONATE ANTACID 500 MG PO CHEW
2.0000 | CHEWABLE_TABLET | Freq: Three times a day (TID) | ORAL | 0 refills | Status: AC
Start: 2024-04-27 — End: 2024-05-11
  Filled 2024-04-27: qty 84, 14d supply, fill #0

## 2024-04-27 NOTE — Progress Notes (Signed)
 Post Partum Day 7 Subjective: Still with some lip tingling and muscle ache (4/10) but doing ok with ambulation, etc. Aware of Ca this AM that, when corrected, is WNL. Desires discharge home  Pain is minimal. Ambulating, voiding, passing flatus. Pumping for baby in NICU  Objective: Blood pressure 137/75, pulse 68, temperature 98.1 F (36.7 C), temperature source Oral, resp. rate 16, height 5' 11 (1.803 m), weight (!) 159.2 kg, SpO2 100%, unknown if currently breastfeeding.  Physical Exam:  General: alert, cooperative, no distress, and morbidly obese. Surgical incision present over lower neck. Dermabond over incision, well-approximated edges Lochia: appropriate Uterine Fundus: firm Incision: Prevena in place, canister and tubing dry DVT Evaluation: No evidence of DVT seen on physical exam. No significant calf/ankle edema.  Recent Labs    04/27/24 0629  HGB 9.2*  HCT 29.8*    Assessment/Plan: Beth [name] is a 38 year old [id number] POD#6  s/p repeat C-section at [redacted]w[redacted]d for pre-eclampsia with severe features and breech presentation.  -POD#7: routine postpartum care, prevena in place    *Given discharge today, will remove in-house prior to discharge -pre-eclampsia w/ SF: Currently asx. Mag completed at 10:30 POD#1. Poor tolerance of Nifedipine . Labetalol  600 TID, much improved after titration yesterday. She has a BP cuff at home and will have short interval follow-up - plan for Monday next week -Primary hyperparathyroidism with associated electrolyte abnormalities:  S/p neck exploration and parathyroidectomy 6/30. Cleared for DC from surgical perspective. Will have outpatient f/u with General Surgery in 3 weeks and Endocrinology within 1 week -hypercalcemia: Ca 8.4 today, plan for TUMS TID on discharge -hypokalemia: likely linked with hyperparathyroidism per Endo. On potassium 40 mEq TID -hypomagnesemia: also likely linked with hyperparathyroidism per Endo. Mag today at 1.9 -Anemia:  post-op Hgb stable at 9.2. PO iron  at discharge. -AKI, resolved   Dispo: Stable BP and symptoms, cleared from both cesarean, BP and Endo/Gen Surg standpoint. DC home with BP/incision next week.    LOS: 34 days   Beth CHRISTELLA Guppy, MD 04/27/2024, 10:51 AM

## 2024-04-27 NOTE — Discharge Summary (Signed)
 Postpartum Discharge Summary  Date of Service updated     Patient Name: Beth [name] DOB: 08-01-1986 MRN: [id number]  Date of admission: 03/24/2024 Delivery date:04/20/2024 Delivering provider: CLAUDENE MORT A Date of discharge: 04/27/2024  Admitting diagnosis: Preeclampsia, severe, third trimester [O14.13] Intrauterine pregnancy: [redacted]w[redacted]d     Secondary diagnosis:  Active Problems:   Hyperparathyroidism, primary (HCC)   Acute hypokalemia  Additional problems: Hypercalcemia, hypomagnesemia, AKI    Discharge diagnosis: Preterm Pregnancy Delivered, Preeclampsia (severe), and Anemia                                              Post partum procedures:none Augmentation: N/A Complications: None  Hospital course: Patient admitted 5/9 initially with concerns for PreE w/ SF. Known anemia and h/o csx with successful VBAC x2. Complicated and prolonged inpatient stay as follows: -pre-eclampsia w/ SF: Completed magnesium  sulfate for seizure ppx and received routine plus rescue BMTZ course for preterm infant. Compelted magnesium  sulfate for 24hrs postop. BP controlled postpartum on Labetalol  600 TID. She has a BP cuff at home and will have short interval follow-up - plan for Monday next week    *Delivered via repeat section on 6/25 as baby was breech -Primary hyperparathyroidism with associated electrolyte abnormalities:  Diagnosis made after hypokalemia/magnesemia coupled with hypercalcemia resulted in IM and Endocrine consults. S/p neck exploration and parathyroidectomy 6/30. Cleared for DC from surgical perspective. Will have outpatient f/u with General Surgery in 3 weeks and Endocrinology within 1 week -hypercalcemia: Ca 8.4 today, plan for TUMS TID on discharge -hypokalemia: likely linked with hyperparathyroidism per Endo. On potassium 40 mEq TID -hypomagnesemia: also likely linked with hyperparathyroidism per Endo. Mag today at 1.9 -Anemia: post-op Hgb stable at 9.2. PO iron  at  discharge. -AKI, resolved Baby boy stable in NICU. Prevena applied immediately s/p section and removed prior to discharge, Replaced with Honeycomb  Magnesium  Sulfate received: Yes: Seizure prophylaxis BMZ received: Yes Rhophylac:No  Immunizations administered: Immunization History  Administered Date(s) Administered   PFIZER Comirnaty(Gray Top)Covid-19 Tri-Sucrose Vaccine 05/01/2021   PFIZER(Purple Top)SARS-COV-2 Vaccination 02/03/2020, 02/27/2020   PNEUMOCOCCAL CONJUGATE-20 04/23/2024   Pfizer Covid-19 Vaccine Bivalent Booster 29yrs & up 10/28/2021    Physical exam  Vitals:   04/26/24 2359 04/27/24 0602 04/27/24 0732 04/27/24 1125  BP: 117/61 121/60 137/75 123/79  Pulse: 74 62 68 71  Resp: 17 18 16 17   Temp: 98.1 F (36.7 C)  98.1 F (36.7 C) 97.9 F (36.6 C)  TempSrc: Oral  Oral Oral  SpO2: 100% 99% 100% 100%  Weight:      Height:       General: alert, cooperative, and no distress Lochia: appropriate Uterine Fundus: firm Incision: Healing well with no significant drainage, No significant erythema, Dressing is clean, dry, and intact DVT Evaluation: No evidence of DVT seen on physical exam. Negative Homan's sign. No cords or calf tenderness. Labs: Lab Results  Component Value Date   WBC 5.4 04/27/2024   HGB 9.2 (L) 04/27/2024   HCT 29.8 (L) 04/27/2024   MCV 83.7 04/27/2024   PLT 224 04/27/2024      Latest Ref Rng & Units 04/27/2024    6:29 AM  CMP  Glucose 70 - 99 mg/dL 94   BUN 6 - 20 mg/dL 25   Creatinine 9.55 - 1.00 mg/dL 9.01   Sodium 864 - 854 mmol/L 136  Potassium 3.5 - 5.1 mmol/L 4.3   Chloride 98 - 111 mmol/L 106   CO2 22 - 32 mmol/L 22   Calcium  8.9 - 10.3 mg/dL 8.4   Total Protein 6.5 - 8.1 g/dL 5.5   Total Bilirubin 0.0 - 1.2 mg/dL 0.7   Alkaline Phos 38 - 126 U/L 53   AST 15 - 41 U/L 15   ALT 0 - 44 U/L 13    Edinburgh Score:    04/22/2024    4:49 PM  Edinburgh Postnatal Depression Scale Screening Tool  I have been able to laugh and see  the funny side of things. 0  I have looked forward with enjoyment to things. 0  I have blamed myself unnecessarily when things went wrong. 1  I have been anxious or worried for no good reason. 1  I have felt scared or panicky for no good reason. 0  Things have been getting on top of me. 1  I have been so unhappy that I have had difficulty sleeping. 0  I have felt sad or miserable. 1  I have been so unhappy that I have been crying. 0  The thought of harming myself has occurred to me. 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    Penicillins Rash, Other (See Comments)   Has patient had a PCN reaction causing immediate rash, facial/tongue/throat swelling, SOB or lightheadedness with hypotension: No Has patient had a PCN reaction causing severe rash involving mucus membranes or skin necrosis: No Has patient had a PCN reaction that required hospitalization: No Has patient had a PCN reaction occurring within the last 10 years: Yes If all of the above answers are NO, then may proceed with Cephalosporin use.   Strawberry (diagnostic) Itching        Medication List     STOP taking these medications    Fusion Plus Caps   NIFEdipine  60 MG 24 hr tablet Commonly known as: ADALAT  CC   Prenatal Gummies/DHA & FA 0.4-32.5 MG Chew   Zepbound  7.5 MG/0.5ML Pen Generic drug: tirzepatide        TAKE these medications    calcitRIOL 0.25 MCG capsule Commonly known as: Rocaltrol Take 1 capsule (0.25 mcg total) by mouth daily for 14 days.   calcium  carbonate 500 MG chewable tablet Commonly known as: Tums Chew 2 tablets (400 mg of elemental calcium  total) by mouth 3 (three) times daily for 14 days.   ibuprofen  800 MG tablet Commonly known as: ADVIL  Take 1 tablet (800 mg total) by mouth every 8 (eight) hours as needed.   labetalol  300 MG  tablet Commonly known as: NORMODYNE  Take 2 tablets (600 mg total) by mouth every 8 (eight) hours.   magnesium  oxide 400 (240 Mg) MG tablet Commonly known as: MAG-OX Take 1 tablet (400 mg total) by mouth 4 (four) times daily.   oxyCODONE  5 MG immediate release tablet Commonly known as: Oxy IR/ROXICODONE  Take 1 tablet (5 mg total) by mouth every 6 (six) hours as needed for severe pain (pain score 7-10).   potassium chloride  SA 20 MEQ tablet Commonly known as: KLOR-CON  M Take 2 tablets (40 mEq total) by mouth 3 (three) times daily.         Discharge home in stable condition Infant Feeding: Bottle Infant Disposition:NICU Discharge  instruction: per After Visit Summary and Postpartum booklet. Activity: Advance as tolerated. Pelvic rest for 6 weeks.  Diet: low salt diet Anticipated Birth Control: Unsure Postpartum Appointment:6 weeks Additional Postpartum F/U: Incision check 1 week Follow up Visit: [facility]   04/27/2024 [name], MD

## 2024-04-27 NOTE — Progress Notes (Signed)
 Pt not in room.

## 2024-04-27 NOTE — Progress Notes (Signed)
 Patient ID: Beth [name], female   DOB: 12-28-1985, 38 y.o.   MRN: [id number]       Chart reviewed.  Discussed with Dr. Alvia yesterday.  Calcium  8.4 mg/dl this AM.  Would recommend discharge home on TUMS two tablets three times a day and Rocaltrol 25 mcg daily for two weeks.  I will plan to see in my office in 2-3 weeks with repeat calcium  and PTH levels.  Patient will also plan a follow up visit with Dr. Chyrl Alexander from endocrinology.  [name], MD [facility] A [facility] practice Office: [phone]

## 2024-04-27 NOTE — Progress Notes (Signed)
 PROGRESS NOTE    Beth [name]  FMW:[id number] DOB: [birth year] DOA: 03/24/2024 PCP: Patient, No Pcp Per    Brief Narrative:   Beth [name] is a 38 y.o. female G4P3 at [redacted] weeks GA with PMH significant for anxiety,  previous history of hypothyroidism, family history of renal potassium wasting who was admitted on 5/29 with preeclampsia by OB.  TRH consulted on 6/01 for hypokalemia.  She was treated with magnesium  and potassium supplements.  Her potassium has now remained in a good range.  Blood pressure has been better controlled.  Plan was to proceed with delivery at 34 weeks. We signed off. Patient has developed hypercalcemia postoperatively; and TRH was reconsulted again on 6/14 to assist with management.   Significant hospital events: 5/29: Admitted by GYN 6/16: Endocrinology consultation, Dr. Faythe 6/17: General Surgery consulted 6/25: s/p C-section 6/30: Neck exploration, right inferior parathyroidectomy, right superior parathyroidectomy with autotransplantation to right sternocleidomastoid muscle; general surgery, Dr. Eletha  Assessment & Plan:   Hypercalcemia 2/2 Primary hyperparathyroidism: PTH: 46, PTH like peptide 45, Vitamin D : 61, TSH. 0.6. 24-hour urine calcium  excludes familial and acquired hypocalciuric hypercalcemia.  Per endocrinology, intact PTH after pregnancy was 45 and ionized calcium  7, again favors primary hyperparathyroidism, no need for Sensipar or calcitonin at this time.  Patient underwent neck exploration, right inferior parathyroidectomy, right superior parathyroidectomy with autotransplantation to right sternocleidomastoid muscle by general surgery, Dr. Eletha on 04/25/2024. -- Endocrinology, Dr. Faythe following; appreciate assistance -- Ca 8.4 today (corrected for albumin = 9.6) -- General Surgery recommends Tums 2 tabs TID, Rocaltrol 0.25 mcg daily x 2 weeks -- Follow-up with general surgery in 3 weeks -- Outpatient follow-up with endocrinology 1-2 weeks   Stable  for discharge from a medical standpoint   Preeclampsia: Status post C-section -- Management as per OB/GYN.   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   Obesity class III Body mass index is 48.95 kg/m.  DVT prophylaxis: SCD's Start: 04/25/24 1543 SCD's Start: 04/20/24 1310    Code Status: Full Code Family Communication: No family present at bedside this morning  Disposition Plan:  Level of care: Postpartum Status is: Inpatient Remains inpatient appropriate because: Per primary, OB/GYN    Consultants:  Endocrinology, Dr. Faythe General Surgery, Dr. Eletha  Procedures:  C-section Neck exploration, right inferior parathyroidectomy, right superior parathyroidectomy with autotransplantation to right sternocleidomastoid muscle; general surgery, Dr. Eletha TTE   Subjective: Patient seen examined bedside, lying in bed.  Eating breakfast.  No complaints this morning.  Seen by general surgery with recommendation of TUMS and rocalitrol for two weeks.  Patient denies headache, no dizziness, no chest pain, no shortness of breath, no abdominal pain, no fever/chills/night sweats, no nausea/vomiting/diarrhea, no paresthesias.  No acute events overnight per nursing staff.  Objective: Vitals:   04/26/24 2033 04/26/24 2359 04/27/24 0602 04/27/24 0732  BP: 126/66 117/61 121/60 137/75  Pulse: 67 74 62 68  Resp: 16 17 18 16   Temp: 97.8 F (36.6 C) 98.1 F (36.7 C)  98.1 F (36.7 C)  TempSrc: Oral Oral  Oral  SpO2: 99% 100% 99% 100%  Weight:      Height:       No intake or output data in the 24 hours ending 04/27/24 1032 Filed Weights   03/24/24 1417 04/25/24 1146  Weight: (!) 161.5 kg (!) 159.2 kg    Examination:  Physical Exam: GEN: NAD, alert and oriented x 3, obese HEENT: NCAT, PERRL, EOMI, sclera clear, MMM PULM: CTAB  w/o wheezes/crackles, normal respiratory effort, on room air CV: RRR w/o M/G/R GI: abd soft, NTND, + BS MSK: no peripheral edema NEURO: No focal neurological deficit    Data Reviewed: I have personally reviewed following labs and imaging studies  CBC: Recent Labs  Lab 04/21/24 0527 04/27/24 0629  WBC 12.2* 5.4  HGB 9.2* 9.2*  HCT 29.5* 29.8*  MCV 82.6 83.7  PLT 214 224   Basic Metabolic Panel: Recent Labs  Lab 04/23/24 0426 04/24/24 0439 04/25/24 0455 04/26/24 0611 04/26/24 1455 04/27/24 0629  NA 136 137 136  --  138 136  K 4.5 4.4 4.3  --  3.9 4.3  CL 107 108 106  --  105 106  CO2 25 21* 23  --  21* 22  GLUCOSE 96 93 94  --  104* 94  BUN 19 23* 21*  --  25* 25*  CREATININE 0.88 0.93 0.75  --  1.04* 0.98  CALCIUM  11.5* 10.7* 10.5* 8.8* 9.1 8.4*  MG 1.5* 1.5* 1.8 1.6* 2.0 1.9  PHOS 3.4  --   --   --   --   --    GFR: Estimated Creatinine Clearance: 133.1 mL/min (by C-G formula based on SCr of 0.98 mg/dL). Liver Function Tests: Recent Labs  Lab 04/23/24 0426 04/24/24 0439 04/25/24 0455 04/26/24 1455 04/27/24 0629  AST 15 15 14* 23 15  ALT 11 12 11 14 13   ALKPHOS 60 55 53 61 53  BILITOT 0.7 0.8 1.0 0.7 0.7  PROT 4.9* 4.9* 5.1* 5.8* 5.5*  ALBUMIN 2.2* 2.3* 2.4* 2.7* 2.5*   No results for input(s): LIPASE, AMYLASE in the last 168 hours. No results for input(s): AMMONIA in the last 168 hours. Coagulation Profile: No results for input(s): INR, PROTIME in the last 168 hours. Cardiac Enzymes: No results for input(s): CKTOTAL, CKMB, CKMBINDEX, TROPONINI in the last 168 hours. BNP (last 3 results) No results for input(s): PROBNP in the last 8760 hours. HbA1C: No results for input(s): HGBA1C in the last 72 hours. CBG: No results for input(s): GLUCAP in the last 168 hours. Lipid Profile: No results for input(s): CHOL, HDL, LDLCALC, TRIG, CHOLHDL, LDLDIRECT in the last 72 hours. Thyroid  Function Tests: No results for  input(s): TSH, T4TOTAL, FREET4, T3FREE, THYROIDAB in the last 72 hours. Anemia Panel: No results for input(s): VITAMINB12, FOLATE, FERRITIN, TIBC, IRON , RETICCTPCT in the last 72 hours. Sepsis Labs: No results for input(s): PROCALCITON, LATICACIDVEN in the last 168 hours.  No results found for this or any previous visit (from the past 240 hours).       Radiology Studies: No results found.      Scheduled Meds:  acetaminophen   1,000 mg Oral Q6H   ibuprofen   600 mg Oral Q6H   labetalol   600 mg Oral Q8H   magnesium  oxide  400 mg Oral QID   potassium chloride   40 mEq Oral TID   prenatal multivitamin  1 tablet Oral Q1200   senna-docusate  2 tablet Oral Daily   simethicone   80 mg Oral TID PC   Continuous Infusions:  sodium chloride  Stopped (04/16/24 1130)     LOS: 34 days    Time spent: 45 minutes spent on 04/27/2024 caring for this patient face-to-face including chart review, ordering labs/tests, documenting, discussion with nursing staff, consultants, updating family and interview/physical exam    Camellia PARAS [place], DO [facility] Available via Epic secure chat 7am-7pm After these hours, please refer to coverage provider listed on [email or web address] 04/27/2024, 10:32 AM

## 2024-04-27 NOTE — Progress Notes (Signed)
 Prevena dressing removed without issue, incision appears CDI, drainage serosanguinous on pad.  BP 123/79 (BP Location: Left Arm)   Pulse 71   Temp 97.9 F (36.6 C) (Oral)   Resp 17   Ht 5' 11 (1.803 m)   Wt (!) 159.2 kg   SpO2 100%   Breastfeeding Unknown   BMI 48.95 kg/m

## 2024-05-02 ENCOUNTER — Telehealth (HOSPITAL_COMMUNITY): Payer: Self-pay | Admitting: *Deleted

## 2024-05-02 NOTE — Telephone Encounter (Signed)
 05/02/2024  Name: Beth [name] MRN: [id number] DOB: 1985/11/10  Reason for Call:  Transition of Care Hospital Discharge Call  Contact Status: Patient Contact Status: Message  Language assistant needed:          Follow-Up Questions:    Van Postnatal Depression Scale:  In the Past 7 Days:    PHQ2-9 Depression Scale:     Discharge Follow-up:    Post-discharge interventions: NA  Mliss Sieve, RN 05/02/2024 14:59
